# Patient Record
Sex: Female | Born: 1935 | Race: White | Hispanic: No
Health system: Southern US, Community
[De-identification: ages and names within clinical notes are randomized; demographics above are authoritative.]

## PROBLEM LIST (undated history)

## (undated) DIAGNOSIS — F419 Anxiety disorder, unspecified: Secondary | ICD-10-CM

## (undated) DIAGNOSIS — E785 Hyperlipidemia, unspecified: Secondary | ICD-10-CM

## (undated) DIAGNOSIS — K851 Biliary acute pancreatitis without necrosis or infection: Secondary | ICD-10-CM

## (undated) DIAGNOSIS — I10 Essential (primary) hypertension: Secondary | ICD-10-CM

## (undated) HISTORY — DX: Biliary acute pancreatitis without necrosis or infection: K85.10

## (undated) HISTORY — DX: Essential (primary) hypertension: I10

## (undated) HISTORY — PX: TONSILLECTOMY: SUR1361

## (undated) HISTORY — DX: Anxiety disorder, unspecified: F41.9

## (undated) HISTORY — DX: Hyperlipidemia, unspecified: E78.5

---

## 2000-10-02 ENCOUNTER — Other Ambulatory Visit: Admission: RE | Admit: 2000-10-02 | Discharge: 2000-10-02 | Payer: Self-pay | Admitting: *Deleted

## 2000-11-21 ENCOUNTER — Encounter: Admission: RE | Admit: 2000-11-21 | Discharge: 2000-11-21 | Payer: Self-pay | Admitting: Family Medicine

## 2000-11-21 ENCOUNTER — Encounter: Payer: Self-pay | Admitting: Family Medicine

## 2001-12-26 ENCOUNTER — Encounter: Admission: RE | Admit: 2001-12-26 | Discharge: 2001-12-26 | Payer: Self-pay | Admitting: Family Medicine

## 2001-12-26 ENCOUNTER — Encounter: Payer: Self-pay | Admitting: Family Medicine

## 2003-01-09 ENCOUNTER — Encounter: Admission: RE | Admit: 2003-01-09 | Discharge: 2003-01-09 | Payer: Self-pay | Admitting: Family Medicine

## 2004-02-19 ENCOUNTER — Encounter: Admission: RE | Admit: 2004-02-19 | Discharge: 2004-02-19 | Payer: Self-pay | Admitting: Family Medicine

## 2005-04-20 ENCOUNTER — Encounter: Admission: RE | Admit: 2005-04-20 | Discharge: 2005-04-20 | Payer: Self-pay | Admitting: Family Medicine

## 2006-06-07 ENCOUNTER — Encounter: Admission: RE | Admit: 2006-06-07 | Discharge: 2006-06-07 | Payer: Self-pay | Admitting: Family Medicine

## 2007-02-08 DIAGNOSIS — K851 Biliary acute pancreatitis without necrosis or infection: Secondary | ICD-10-CM

## 2007-02-08 HISTORY — DX: Biliary acute pancreatitis without necrosis or infection: K85.10

## 2007-07-12 ENCOUNTER — Encounter: Admission: RE | Admit: 2007-07-12 | Discharge: 2007-07-12 | Payer: Self-pay | Admitting: Family Medicine

## 2007-10-30 ENCOUNTER — Inpatient Hospital Stay (HOSPITAL_COMMUNITY): Admission: EM | Admit: 2007-10-30 | Discharge: 2007-11-10 | Payer: Self-pay | Admitting: Emergency Medicine

## 2007-10-31 ENCOUNTER — Ambulatory Visit: Payer: Self-pay | Admitting: Internal Medicine

## 2007-11-07 ENCOUNTER — Encounter (INDEPENDENT_AMBULATORY_CARE_PROVIDER_SITE_OTHER): Payer: Self-pay | Admitting: Internal Medicine

## 2007-12-09 HISTORY — PX: CHOLECYSTECTOMY: SHX55

## 2007-12-24 ENCOUNTER — Encounter (INDEPENDENT_AMBULATORY_CARE_PROVIDER_SITE_OTHER): Payer: Self-pay | Admitting: General Surgery

## 2007-12-24 ENCOUNTER — Ambulatory Visit (HOSPITAL_COMMUNITY): Admission: RE | Admit: 2007-12-24 | Discharge: 2007-12-25 | Payer: Self-pay | Admitting: General Surgery

## 2008-07-14 ENCOUNTER — Encounter: Admission: RE | Admit: 2008-07-14 | Discharge: 2008-07-14 | Payer: Self-pay | Admitting: Family Medicine

## 2008-07-28 ENCOUNTER — Ambulatory Visit: Payer: Self-pay | Admitting: Internal Medicine

## 2008-08-07 ENCOUNTER — Encounter: Payer: Self-pay | Admitting: Internal Medicine

## 2008-08-07 ENCOUNTER — Ambulatory Visit: Payer: Self-pay | Admitting: Internal Medicine

## 2008-08-11 ENCOUNTER — Encounter: Payer: Self-pay | Admitting: Internal Medicine

## 2009-07-08 DIAGNOSIS — C9210 Chronic myeloid leukemia, BCR/ABL-positive, not having achieved remission: Secondary | ICD-10-CM | POA: Insufficient documentation

## 2009-07-13 ENCOUNTER — Ambulatory Visit: Payer: Self-pay | Admitting: Oncology

## 2009-07-13 ENCOUNTER — Other Ambulatory Visit: Admission: RE | Admit: 2009-07-13 | Discharge: 2009-07-13 | Payer: Self-pay | Admitting: Oncology

## 2009-07-13 LAB — CBC WITH DIFFERENTIAL/PLATELET
HCT: 23.6 % — ABNORMAL LOW (ref 34.8–46.6)
HGB: 8.2 g/dL — ABNORMAL LOW (ref 11.6–15.9)
MCH: 34 pg (ref 25.1–34.0)
MCHC: 34.7 g/dL (ref 31.5–36.0)
MCV: 97.9 fL (ref 79.5–101.0)
Platelets: 330 10*3/uL (ref 145–400)
RBC: 2.41 10*6/uL — ABNORMAL LOW (ref 3.70–5.45)
RDW: 17.8 % — ABNORMAL HIGH (ref 11.2–14.5)
WBC: 418.9 10*3/uL (ref 3.9–10.3)
nRBC: 2 % — ABNORMAL HIGH (ref 0–0)

## 2009-07-13 LAB — COMPREHENSIVE METABOLIC PANEL
ALT: 17 U/L (ref 0–35)
AST: 42 U/L — ABNORMAL HIGH (ref 0–37)
Albumin: 3.3 g/dL — ABNORMAL LOW (ref 3.5–5.2)
Alkaline Phosphatase: 82 U/L (ref 39–117)
BUN: 13 mg/dL (ref 6–23)
CO2: 25 mEq/L (ref 19–32)
Calcium: 8.9 mg/dL (ref 8.4–10.5)
Chloride: 106 mEq/L (ref 96–112)
Creatinine, Ser: 1.04 mg/dL (ref 0.40–1.20)
Glucose, Bld: 98 mg/dL (ref 70–99)
Potassium: 3.1 mEq/L — ABNORMAL LOW (ref 3.5–5.3)
Sodium: 140 mEq/L (ref 135–145)
Total Bilirubin: 0.7 mg/dL (ref 0.3–1.2)
Total Protein: 6.6 g/dL (ref 6.0–8.3)

## 2009-07-13 LAB — LACTATE DEHYDROGENASE: LDH: 1631 U/L — ABNORMAL HIGH (ref 94–250)

## 2009-07-13 LAB — MANUAL DIFFERENTIAL
ALC: 4.2 10e3/uL — ABNORMAL HIGH (ref 0.9–3.3)
ANC (CHCC manual diff): 339.3 10e3/uL — ABNORMAL HIGH (ref 1.5–6.5)
Band Neutrophils: 5 % (ref 0–10)
Basophil: 7 % — ABNORMAL HIGH (ref 0–2)
Blasts: 4 % — ABNORMAL HIGH (ref 0–0)
EOS: 6 % (ref 0–7)
LYMPH: 1 % — ABNORMAL LOW (ref 14–49)
MONO: 0 % (ref 0–14)
Metamyelocytes: 8 % — ABNORMAL HIGH (ref 0–0)
Myelocytes: 33 % — ABNORMAL HIGH (ref 0–0)
Other Cell: 0 % (ref 0–0)
PLT EST: ADEQUATE
PROMYELO: 1 % — ABNORMAL HIGH (ref 0–0)
SEG: 35 % — ABNORMAL LOW (ref 38–77)
Variant Lymph: 0 % (ref 0–0)
nRBC: 2 % — ABNORMAL HIGH (ref 0–0)

## 2009-07-13 LAB — URIC ACID: Uric Acid, Serum: 6.2 mg/dL (ref 2.4–7.0)

## 2009-07-16 LAB — BCR/ABL (LIO MMD)

## 2009-07-17 LAB — BASIC METABOLIC PANEL
BUN: 10 mg/dL (ref 6–23)
CO2: 24 mEq/L (ref 19–32)
Calcium: 8.2 mg/dL — ABNORMAL LOW (ref 8.4–10.5)
Chloride: 105 mEq/L (ref 96–112)
Creatinine, Ser: 0.99 mg/dL (ref 0.40–1.20)
Glucose, Bld: 106 mg/dL — ABNORMAL HIGH (ref 70–99)
Potassium: 3.6 mEq/L (ref 3.5–5.3)
Sodium: 136 mEq/L (ref 135–145)

## 2009-07-17 LAB — CBC WITH DIFFERENTIAL/PLATELET
HCT: 22.4 % — ABNORMAL LOW (ref 34.8–46.6)
HGB: 7.8 g/dL — ABNORMAL LOW (ref 11.6–15.9)
MCH: 34.4 pg — ABNORMAL HIGH (ref 25.1–34.0)
MCHC: 34.8 g/dL (ref 31.5–36.0)
MCV: 98.7 fL (ref 79.5–101.0)
Platelets: 222 10*3/uL (ref 145–400)
RBC: 2.27 10*6/uL — ABNORMAL LOW (ref 3.70–5.45)
RDW: 17.8 % — ABNORMAL HIGH (ref 11.2–14.5)
WBC: 397 10*3/uL (ref 3.9–10.3)
nRBC: 2 % — ABNORMAL HIGH (ref 0–0)

## 2009-07-17 LAB — MANUAL DIFFERENTIAL
ALC: 4 10*3/uL — ABNORMAL HIGH (ref 0.9–3.3)
ANC (CHCC manual diff): 273.9 10*3/uL — ABNORMAL HIGH (ref 1.5–6.5)
Band Neutrophils: 14 % — ABNORMAL HIGH (ref 0–10)
Basophil: 14 % — ABNORMAL HIGH (ref 0–2)
Blasts: 4 % — ABNORMAL HIGH (ref 0–0)
EOS: 8 % — ABNORMAL HIGH (ref 0–7)
LYMPH: 1 % — ABNORMAL LOW (ref 14–49)
MONO: 1 % (ref 0–14)
Metamyelocytes: 16 % — ABNORMAL HIGH (ref 0–0)
Myelocytes: 17 % — ABNORMAL HIGH (ref 0–0)
Other Cell: 0 % (ref 0–0)
PLT EST: ADEQUATE
PROMYELO: 3 % — ABNORMAL HIGH (ref 0–0)
SEG: 22 % — ABNORMAL LOW (ref 38–77)
Variant Lymph: 0 % (ref 0–0)
nRBC: 3 % — ABNORMAL HIGH (ref 0–0)

## 2009-07-24 LAB — CBC WITH DIFFERENTIAL/PLATELET
HCT: 22.7 % — ABNORMAL LOW (ref 34.8–46.6)
HGB: 7.7 g/dL — ABNORMAL LOW (ref 11.6–15.9)
MCH: 33.8 pg (ref 25.1–34.0)
MCHC: 33.9 g/dL (ref 31.5–36.0)
MCV: 99.6 fL (ref 79.5–101.0)
Platelets: 410 10*3/uL — ABNORMAL HIGH (ref 145–400)
RBC: 2.28 10*6/uL — ABNORMAL LOW (ref 3.70–5.45)
RDW: 19.8 % — ABNORMAL HIGH (ref 11.2–14.5)
WBC: 323.8 10*3/uL (ref 3.9–10.3)
nRBC: 2 % — ABNORMAL HIGH (ref 0–0)

## 2009-07-24 LAB — MANUAL DIFFERENTIAL
ALC: 3.2 10*3/uL (ref 0.9–3.3)
ANC (CHCC manual diff): 229.9 10*3/uL — ABNORMAL HIGH (ref 1.5–6.5)
Band Neutrophils: 8 % (ref 0–10)
Basophil: 10 % — ABNORMAL HIGH (ref 0–2)
Blasts: 2 % — ABNORMAL HIGH (ref 0–0)
EOS: 8 % — ABNORMAL HIGH (ref 0–7)
LYMPH: 1 % — ABNORMAL LOW (ref 14–49)
MONO: 6 % (ref 0–14)
Metamyelocytes: 6 % — ABNORMAL HIGH (ref 0–0)
Myelocytes: 3 % — ABNORMAL HIGH (ref 0–0)
Other Cell: 0 % (ref 0–0)
PLT EST: INCREASED
PROMYELO: 2 % — ABNORMAL HIGH (ref 0–0)
SEG: 54 % (ref 38–77)
Variant Lymph: 0 % (ref 0–0)
nRBC: 3 % — ABNORMAL HIGH (ref 0–0)

## 2009-07-29 LAB — CYTOGENETICS, PERIPHERAL BLOOD

## 2009-07-29 LAB — FISH, PERIPHERAL BLOOD

## 2009-07-31 LAB — MANUAL DIFFERENTIAL
ALC: 11.3 10*3/uL — ABNORMAL HIGH (ref 0.9–3.3)
ANC (CHCC manual diff): 167.8 10*3/uL — ABNORMAL HIGH (ref 1.5–6.5)
Band Neutrophils: 21 % — ABNORMAL HIGH (ref 0–10)
Basophil: 6 % — ABNORMAL HIGH (ref 0–2)
Blasts: 2 % — ABNORMAL HIGH (ref 0–0)
EOS: 4 % (ref 0–7)
LYMPH: 5 % — ABNORMAL LOW (ref 14–49)
MONO: 7 % (ref 0–14)
Metamyelocytes: 7 % — ABNORMAL HIGH (ref 0–0)
Myelocytes: 11 % — ABNORMAL HIGH (ref 0–0)
Other Cell: 0 % (ref 0–0)
PLT EST: INCREASED
PROMYELO: 2 % — ABNORMAL HIGH (ref 0–0)
SEG: 35 % — ABNORMAL LOW (ref 38–77)
Variant Lymph: 0 % (ref 0–0)
nRBC: 2 % — ABNORMAL HIGH (ref 0–0)

## 2009-07-31 LAB — CBC WITH DIFFERENTIAL/PLATELET
HCT: 25.4 % — ABNORMAL LOW (ref 34.8–46.6)
HGB: 8.2 g/dL — ABNORMAL LOW (ref 11.6–15.9)
MCH: 33.5 pg (ref 25.1–34.0)
MCHC: 32.3 g/dL (ref 31.5–36.0)
MCV: 103.7 fL — ABNORMAL HIGH (ref 79.5–101.0)
Platelets: 525 10*3/uL — ABNORMAL HIGH (ref 145–400)
RBC: 2.45 10*6/uL — ABNORMAL LOW (ref 3.70–5.45)
RDW: 23.8 % — ABNORMAL HIGH (ref 11.2–14.5)
WBC: 226.8 10*3/uL (ref 3.9–10.3)
nRBC: 3 % — ABNORMAL HIGH (ref 0–0)

## 2009-08-12 ENCOUNTER — Ambulatory Visit: Payer: Self-pay | Admitting: Oncology

## 2009-08-12 LAB — CBC WITH DIFFERENTIAL/PLATELET
HCT: 25.2 % — ABNORMAL LOW (ref 34.8–46.6)
HGB: 8.5 g/dL — ABNORMAL LOW (ref 11.6–15.9)
MCH: 34.6 pg — ABNORMAL HIGH (ref 25.1–34.0)
MCHC: 33.7 g/dL (ref 31.5–36.0)
MCV: 102.7 fL — ABNORMAL HIGH (ref 79.5–101.0)
Platelets: 463 10*3/uL — ABNORMAL HIGH (ref 145–400)
RBC: 2.46 10*6/uL — ABNORMAL LOW (ref 3.70–5.45)
RDW: 26 % — ABNORMAL HIGH (ref 11.2–14.5)
WBC: 217.7 10*3/uL (ref 3.9–10.3)

## 2009-08-12 LAB — MANUAL DIFFERENTIAL
ALC: 4.4 10*3/uL — ABNORMAL HIGH (ref 0.9–3.3)
ANC (CHCC manual diff): 169.8 10*3/uL — ABNORMAL HIGH (ref 1.5–6.5)
Band Neutrophils: 11 % — ABNORMAL HIGH (ref 0–10)
Basophil: 9 % — ABNORMAL HIGH (ref 0–2)
Blasts: 2 % — ABNORMAL HIGH (ref 0–0)
EOS: 4 % (ref 0–7)
LYMPH: 2 % — ABNORMAL LOW (ref 14–49)
MONO: 1 % (ref 0–14)
Metamyelocytes: 15 % — ABNORMAL HIGH (ref 0–0)
Myelocytes: 10 % — ABNORMAL HIGH (ref 0–0)
Other Cell: 0 % (ref 0–0)
PLT EST: INCREASED
PROMYELO: 4 % — ABNORMAL HIGH (ref 0–0)
SEG: 42 % (ref 38–77)
Variant Lymph: 0 % (ref 0–0)
nRBC: 2 % — ABNORMAL HIGH (ref 0–0)

## 2009-08-18 LAB — MANUAL DIFFERENTIAL
ALC: 5.7 10*3/uL — ABNORMAL HIGH (ref 0.9–3.3)
ANC (CHCC manual diff): 134.5 10*3/uL — ABNORMAL HIGH (ref 1.5–6.5)
Band Neutrophils: 20 % — ABNORMAL HIGH (ref 0–10)
Basophil: 13 % — ABNORMAL HIGH (ref 0–2)
Blasts: 2 % — ABNORMAL HIGH (ref 0–0)
EOS: 5 % (ref 0–7)
LYMPH: 3 % — ABNORMAL LOW (ref 14–49)
MONO: 1 % (ref 0–14)
Metamyelocytes: 8 % — ABNORMAL HIGH (ref 0–0)
Myelocytes: 15 % — ABNORMAL HIGH (ref 0–0)
Other Cell: 0 % (ref 0–0)
PLT EST: ADEQUATE
PROMYELO: 5 % — ABNORMAL HIGH (ref 0–0)
SEG: 28 % — ABNORMAL LOW (ref 38–77)
Variant Lymph: 0 % (ref 0–0)
nRBC: 1 % — ABNORMAL HIGH (ref 0–0)

## 2009-08-18 LAB — CBC WITH DIFFERENTIAL/PLATELET
HCT: 24.6 % — ABNORMAL LOW (ref 34.8–46.6)
HGB: 8.6 g/dL — ABNORMAL LOW (ref 11.6–15.9)
MCH: 36.8 pg — ABNORMAL HIGH (ref 25.1–34.0)
MCHC: 34.9 g/dL (ref 31.5–36.0)
MCV: 105.3 fL — ABNORMAL HIGH (ref 79.5–101.0)
Platelets: 431 10*3/uL — ABNORMAL HIGH (ref 145–400)
RBC: 2.34 10*6/uL — ABNORMAL LOW (ref 3.70–5.45)
RDW: 27.1 % — ABNORMAL HIGH (ref 11.2–14.5)
WBC: 189.4 10*3/uL (ref 3.9–10.3)

## 2009-08-24 LAB — MANUAL DIFFERENTIAL
ALC: 2.1 10*3/uL (ref 0.9–3.3)
ANC (CHCC manual diff): 172.9 10*3/uL — ABNORMAL HIGH (ref 1.5–6.5)
Band Neutrophils: 18 % — ABNORMAL HIGH (ref 0–10)
Basophil: 8 % — ABNORMAL HIGH (ref 0–2)
Blasts: 2 % — ABNORMAL HIGH (ref 0–0)
EOS: 5 % (ref 0–7)
LYMPH: 1 % — ABNORMAL LOW (ref 14–49)
MONO: 1 % (ref 0–14)
Metamyelocytes: 11 % — ABNORMAL HIGH (ref 0–0)
Myelocytes: 22 % — ABNORMAL HIGH (ref 0–0)
PLT EST: ADEQUATE
PROMYELO: 2 % — ABNORMAL HIGH (ref 0–0)
Platelet Morphology: NORMAL
SEG: 30 % — ABNORMAL LOW (ref 38–77)
nRBC: 6 % — ABNORMAL HIGH (ref 0–0)

## 2009-08-24 LAB — CBC WITH DIFFERENTIAL/PLATELET
HCT: 24.4 % — ABNORMAL LOW (ref 34.8–46.6)
HGB: 8.1 g/dL — ABNORMAL LOW (ref 11.6–15.9)
MCH: 36.5 pg — ABNORMAL HIGH (ref 25.1–34.0)
MCHC: 33.2 g/dL (ref 31.5–36.0)
MCV: 109.9 fL — ABNORMAL HIGH (ref 79.5–101.0)
Platelets: 376 10*3/uL (ref 145–400)
RBC: 2.22 10*6/uL — ABNORMAL LOW (ref 3.70–5.45)
RDW: 26.3 % — ABNORMAL HIGH (ref 11.2–14.5)
WBC: 213.4 10*3/uL (ref 3.9–10.3)
nRBC: 2 % — ABNORMAL HIGH (ref 0–0)

## 2009-09-01 LAB — CBC WITH DIFFERENTIAL/PLATELET
BASO%: 0 % (ref 0.0–2.0)
Basophils Absolute: 0 10*3/uL (ref 0.0–0.1)
EOS%: 2.8 % (ref 0.0–7.0)
Eosinophils Absolute: 5.3 10*3/uL — ABNORMAL HIGH (ref 0.0–0.5)
HCT: 26.9 % — ABNORMAL LOW (ref 34.8–46.6)
HGB: 9.1 g/dL — ABNORMAL LOW (ref 11.6–15.9)
LYMPH%: 4.9 % — ABNORMAL LOW (ref 14.0–49.7)
MCH: 37.4 pg — ABNORMAL HIGH (ref 25.1–34.0)
MCHC: 34 g/dL (ref 31.5–36.0)
MCV: 109.9 fL — ABNORMAL HIGH (ref 79.5–101.0)
MONO#: 1.3 10*3/uL — ABNORMAL HIGH (ref 0.1–0.9)
MONO%: 0.7 % (ref 0.0–14.0)
NEUT#: 172.7 10*3/uL — ABNORMAL HIGH (ref 1.5–6.5)
NEUT%: 91.6 % — ABNORMAL HIGH (ref 38.4–76.8)
Platelets: 430 10*3/uL — ABNORMAL HIGH (ref 145–400)
RBC: 2.45 10*6/uL — ABNORMAL LOW (ref 3.70–5.45)
RDW: 27.5 % — ABNORMAL HIGH (ref 11.2–14.5)
WBC: 188.5 10*3/uL (ref 3.9–10.3)
lymph#: 9.2 10*3/uL — ABNORMAL HIGH (ref 0.9–3.3)

## 2009-09-01 LAB — TECHNOLOGIST REVIEW: Technologist Review: 13

## 2009-09-11 ENCOUNTER — Ambulatory Visit: Payer: Self-pay | Admitting: Oncology

## 2009-09-15 LAB — CBC WITH DIFFERENTIAL/PLATELET
BASO%: 0 % (ref 0.0–2.0)
Basophils Absolute: 0 10*3/uL (ref 0.0–0.1)
EOS%: 0.7 % (ref 0.0–7.0)
Eosinophils Absolute: 0.1 10*3/uL (ref 0.0–0.5)
HCT: 24.9 % — ABNORMAL LOW (ref 34.8–46.6)
HGB: 8.6 g/dL — ABNORMAL LOW (ref 11.6–15.9)
LYMPH%: 8 % — ABNORMAL LOW (ref 14.0–49.7)
MCH: 37.1 pg — ABNORMAL HIGH (ref 25.1–34.0)
MCHC: 34.6 g/dL (ref 31.5–36.0)
MCV: 107.3 fL — ABNORMAL HIGH (ref 79.5–101.0)
MONO#: 0.9 10*3/uL (ref 0.1–0.9)
MONO%: 9 % (ref 0.0–14.0)
NEUT#: 8 10*3/uL — ABNORMAL HIGH (ref 1.5–6.5)
NEUT%: 82.3 % — ABNORMAL HIGH (ref 38.4–76.8)
Platelets: 190 10*3/uL (ref 145–400)
RBC: 2.32 10*6/uL — ABNORMAL LOW (ref 3.70–5.45)
RDW: 19.8 % — ABNORMAL HIGH (ref 11.2–14.5)
WBC: 9.8 10*3/uL (ref 3.9–10.3)
lymph#: 0.8 10*3/uL — ABNORMAL LOW (ref 0.9–3.3)

## 2009-09-21 LAB — CBC WITH DIFFERENTIAL/PLATELET
BASO%: 0.1 % (ref 0.0–2.0)
Basophils Absolute: 0 10*3/uL (ref 0.0–0.1)
EOS%: 0.8 % (ref 0.0–7.0)
Eosinophils Absolute: 0.1 10*3/uL (ref 0.0–0.5)
HCT: 24.9 % — ABNORMAL LOW (ref 34.8–46.6)
HGB: 8.4 g/dL — ABNORMAL LOW (ref 11.6–15.9)
LYMPH%: 8.2 % — ABNORMAL LOW (ref 14.0–49.7)
MCH: 36.1 pg — ABNORMAL HIGH (ref 25.1–34.0)
MCHC: 34 g/dL (ref 31.5–36.0)
MCV: 106.1 fL — ABNORMAL HIGH (ref 79.5–101.0)
MONO#: 0.3 10*3/uL (ref 0.1–0.9)
MONO%: 3.9 % (ref 0.0–14.0)
NEUT#: 5.7 10*3/uL (ref 1.5–6.5)
NEUT%: 87 % — ABNORMAL HIGH (ref 38.4–76.8)
Platelets: 156 10*3/uL (ref 145–400)
RBC: 2.34 10*6/uL — ABNORMAL LOW (ref 3.70–5.45)
RDW: 17.7 % — ABNORMAL HIGH (ref 11.2–14.5)
WBC: 6.5 10*3/uL (ref 3.9–10.3)
lymph#: 0.5 10*3/uL — ABNORMAL LOW (ref 0.9–3.3)

## 2009-09-21 LAB — COMPREHENSIVE METABOLIC PANEL
ALT: 9 U/L (ref 0–35)
AST: 12 U/L (ref 0–37)
Albumin: 3.5 g/dL (ref 3.5–5.2)
Alkaline Phosphatase: 61 U/L (ref 39–117)
BUN: 10 mg/dL (ref 6–23)
CO2: 25 mEq/L (ref 19–32)
Calcium: 8.2 mg/dL — ABNORMAL LOW (ref 8.4–10.5)
Chloride: 102 mEq/L (ref 96–112)
Creatinine, Ser: 0.9 mg/dL (ref 0.40–1.20)
Glucose, Bld: 133 mg/dL — ABNORMAL HIGH (ref 70–99)
Potassium: 3 mEq/L — ABNORMAL LOW (ref 3.5–5.3)
Sodium: 138 mEq/L (ref 135–145)
Total Bilirubin: 1.1 mg/dL (ref 0.3–1.2)
Total Protein: 5.7 g/dL — ABNORMAL LOW (ref 6.0–8.3)

## 2009-10-06 LAB — CBC WITH DIFFERENTIAL/PLATELET
BASO%: 1.5 % (ref 0.0–2.0)
Basophils Absolute: 0.1 10*3/uL (ref 0.0–0.1)
EOS%: 0.3 % (ref 0.0–7.0)
Eosinophils Absolute: 0 10*3/uL (ref 0.0–0.5)
HCT: 20.4 % — ABNORMAL LOW (ref 34.8–46.6)
HGB: 6.9 g/dL — CL (ref 11.6–15.9)
LYMPH%: 9.7 % — ABNORMAL LOW (ref 14.0–49.7)
MCH: 35.9 pg — ABNORMAL HIGH (ref 25.1–34.0)
MCHC: 33.8 g/dL (ref 31.5–36.0)
MCV: 106.3 fL — ABNORMAL HIGH (ref 79.5–101.0)
MONO#: 0.4 10*3/uL (ref 0.1–0.9)
MONO%: 6 % (ref 0.0–14.0)
NEUT#: 5 10*3/uL (ref 1.5–6.5)
NEUT%: 82.5 % — ABNORMAL HIGH (ref 38.4–76.8)
Platelets: 92 10*3/uL — ABNORMAL LOW (ref 145–400)
RBC: 1.92 10*6/uL — ABNORMAL LOW (ref 3.70–5.45)
RDW: 18.8 % — ABNORMAL HIGH (ref 11.2–14.5)
WBC: 6 10*3/uL (ref 3.9–10.3)
lymph#: 0.6 10*3/uL — ABNORMAL LOW (ref 0.9–3.3)

## 2009-10-13 ENCOUNTER — Ambulatory Visit: Payer: Self-pay | Admitting: Oncology

## 2009-10-13 LAB — CBC WITH DIFFERENTIAL/PLATELET
BASO%: 1.1 % (ref 0.0–2.0)
Basophils Absolute: 0.1 10*3/uL (ref 0.0–0.1)
EOS%: 1.7 % (ref 0.0–7.0)
Eosinophils Absolute: 0.1 10*3/uL (ref 0.0–0.5)
HCT: 23 % — ABNORMAL LOW (ref 34.8–46.6)
HGB: 7.8 g/dL — ABNORMAL LOW (ref 11.6–15.9)
LYMPH%: 13.8 % — ABNORMAL LOW (ref 14.0–49.7)
MCH: 36.8 pg — ABNORMAL HIGH (ref 25.1–34.0)
MCHC: 33.8 g/dL (ref 31.5–36.0)
MCV: 108.8 fL — ABNORMAL HIGH (ref 79.5–101.0)
MONO#: 0.4 10*3/uL (ref 0.1–0.9)
MONO%: 8 % (ref 0.0–14.0)
NEUT#: 4.1 10*3/uL (ref 1.5–6.5)
NEUT%: 75.4 % (ref 38.4–76.8)
Platelets: 168 10*3/uL (ref 145–400)
RBC: 2.11 10*6/uL — ABNORMAL LOW (ref 3.70–5.45)
RDW: 19.4 % — ABNORMAL HIGH (ref 11.2–14.5)
WBC: 5.5 10*3/uL (ref 3.9–10.3)
lymph#: 0.8 10*3/uL — ABNORMAL LOW (ref 0.9–3.3)

## 2009-10-13 LAB — COMPREHENSIVE METABOLIC PANEL
ALT: 12 U/L (ref 0–35)
AST: 14 U/L (ref 0–37)
Albumin: 3.6 g/dL (ref 3.5–5.2)
Alkaline Phosphatase: 59 U/L (ref 39–117)
BUN: 10 mg/dL (ref 6–23)
CO2: 28 mEq/L (ref 19–32)
Calcium: 8.7 mg/dL (ref 8.4–10.5)
Chloride: 105 mEq/L (ref 96–112)
Creatinine, Ser: 0.8 mg/dL (ref 0.40–1.20)
Glucose, Bld: 100 mg/dL — ABNORMAL HIGH (ref 70–99)
Potassium: 3.6 mEq/L (ref 3.5–5.3)
Sodium: 138 mEq/L (ref 135–145)
Total Bilirubin: 0.7 mg/dL (ref 0.3–1.2)
Total Protein: 5.7 g/dL — ABNORMAL LOW (ref 6.0–8.3)

## 2009-10-13 LAB — PHOSPHORUS: Phosphorus: 2.6 mg/dL (ref 2.3–4.6)

## 2009-10-13 LAB — MAGNESIUM: Magnesium: 1.8 mg/dL (ref 1.5–2.5)

## 2009-11-12 ENCOUNTER — Ambulatory Visit: Payer: Self-pay | Admitting: Oncology

## 2009-11-16 LAB — BASIC METABOLIC PANEL
BUN: 14 mg/dL (ref 6–23)
CO2: 30 mEq/L (ref 19–32)
Calcium: 8.8 mg/dL (ref 8.4–10.5)
Chloride: 103 mEq/L (ref 96–112)
Creatinine, Ser: 0.79 mg/dL (ref 0.40–1.20)
Glucose, Bld: 97 mg/dL (ref 70–99)
Potassium: 3.4 mEq/L — ABNORMAL LOW (ref 3.5–5.3)
Sodium: 139 mEq/L (ref 135–145)

## 2009-11-16 LAB — CBC WITH DIFFERENTIAL/PLATELET
BASO%: 1 % (ref 0.0–2.0)
Basophils Absolute: 0 10*3/uL (ref 0.0–0.1)
EOS%: 2.9 % (ref 0.0–7.0)
Eosinophils Absolute: 0.1 10*3/uL (ref 0.0–0.5)
HCT: 31.6 % — ABNORMAL LOW (ref 34.8–46.6)
HGB: 10.8 g/dL — ABNORMAL LOW (ref 11.6–15.9)
LYMPH%: 25.3 % (ref 14.0–49.7)
MCH: 32 pg (ref 25.1–34.0)
MCHC: 34 g/dL (ref 31.5–36.0)
MCV: 94.3 fL (ref 79.5–101.0)
MONO#: 0.3 10*3/uL (ref 0.1–0.9)
MONO%: 6 % (ref 0.0–14.0)
NEUT#: 2.7 10*3/uL (ref 1.5–6.5)
NEUT%: 64.8 % (ref 38.4–76.8)
Platelets: 164 10*3/uL (ref 145–400)
RBC: 3.35 10*6/uL — ABNORMAL LOW (ref 3.70–5.45)
RDW: 16.3 % — ABNORMAL HIGH (ref 11.2–14.5)
WBC: 4.2 10*3/uL (ref 3.9–10.3)
lymph#: 1.1 10*3/uL (ref 0.9–3.3)

## 2009-11-26 LAB — CBC WITH DIFFERENTIAL/PLATELET
BASO%: 1.7 % (ref 0.0–2.0)
Basophils Absolute: 0.1 10*3/uL (ref 0.0–0.1)
EOS%: 1.7 % (ref 0.0–7.0)
Eosinophils Absolute: 0.1 10*3/uL (ref 0.0–0.5)
HCT: 32 % — ABNORMAL LOW (ref 34.8–46.6)
HGB: 11.1 g/dL — ABNORMAL LOW (ref 11.6–15.9)
LYMPH%: 35.6 % (ref 14.0–49.7)
MCH: 32.2 pg (ref 25.1–34.0)
MCHC: 34.8 g/dL (ref 31.5–36.0)
MCV: 92.6 fL (ref 79.5–101.0)
MONO#: 0.3 10*3/uL (ref 0.1–0.9)
MONO%: 10.9 % (ref 0.0–14.0)
NEUT#: 1.5 10*3/uL (ref 1.5–6.5)
NEUT%: 50.1 % (ref 38.4–76.8)
Platelets: 129 10*3/uL — ABNORMAL LOW (ref 145–400)
RBC: 3.46 10*6/uL — ABNORMAL LOW (ref 3.70–5.45)
RDW: 16.9 % — ABNORMAL HIGH (ref 11.2–14.5)
WBC: 3 10*3/uL — ABNORMAL LOW (ref 3.9–10.3)
lymph#: 1.1 10*3/uL (ref 0.9–3.3)

## 2009-11-26 LAB — BASIC METABOLIC PANEL
BUN: 17 mg/dL (ref 6–23)
CO2: 28 mEq/L (ref 19–32)
Calcium: 8.8 mg/dL (ref 8.4–10.5)
Chloride: 106 mEq/L (ref 96–112)
Creatinine, Ser: 0.9 mg/dL (ref 0.40–1.20)
Glucose, Bld: 103 mg/dL — ABNORMAL HIGH (ref 70–99)
Potassium: 3.5 mEq/L (ref 3.5–5.3)
Sodium: 140 mEq/L (ref 135–145)

## 2009-12-08 LAB — CBC WITH DIFFERENTIAL/PLATELET
BASO%: 1.2 % (ref 0.0–2.0)
Basophils Absolute: 0 10*3/uL (ref 0.0–0.1)
EOS%: 2.6 % (ref 0.0–7.0)
Eosinophils Absolute: 0.1 10*3/uL (ref 0.0–0.5)
HCT: 32.2 % — ABNORMAL LOW (ref 34.8–46.6)
HGB: 11.1 g/dL — ABNORMAL LOW (ref 11.6–15.9)
LYMPH%: 33.8 % (ref 14.0–49.7)
MCH: 31.9 pg (ref 25.1–34.0)
MCHC: 34.4 g/dL (ref 31.5–36.0)
MCV: 92.7 fL (ref 79.5–101.0)
MONO#: 0.2 10*3/uL (ref 0.1–0.9)
MONO%: 7.3 % (ref 0.0–14.0)
NEUT#: 1.8 10*3/uL (ref 1.5–6.5)
NEUT%: 55.1 % (ref 38.4–76.8)
Platelets: 145 10*3/uL (ref 145–400)
RBC: 3.48 10*6/uL — ABNORMAL LOW (ref 3.70–5.45)
RDW: 18.2 % — ABNORMAL HIGH (ref 11.2–14.5)
WBC: 3.2 10*3/uL — ABNORMAL LOW (ref 3.9–10.3)
lymph#: 1.1 10*3/uL (ref 0.9–3.3)

## 2009-12-18 ENCOUNTER — Ambulatory Visit: Payer: Self-pay | Admitting: Oncology

## 2009-12-22 LAB — CBC WITH DIFFERENTIAL/PLATELET
BASO%: 0.9 % (ref 0.0–2.0)
Basophils Absolute: 0 10*3/uL (ref 0.0–0.1)
EOS%: 2.5 % (ref 0.0–7.0)
Eosinophils Absolute: 0.1 10*3/uL (ref 0.0–0.5)
HCT: 32.7 % — ABNORMAL LOW (ref 34.8–46.6)
HGB: 11.4 g/dL — ABNORMAL LOW (ref 11.6–15.9)
LYMPH%: 26.8 % (ref 14.0–49.7)
MCH: 32 pg (ref 25.1–34.0)
MCHC: 34.8 g/dL (ref 31.5–36.0)
MCV: 91.8 fL (ref 79.5–101.0)
MONO#: 0.3 10*3/uL (ref 0.1–0.9)
MONO%: 7.7 % (ref 0.0–14.0)
NEUT#: 2.3 10*3/uL (ref 1.5–6.5)
NEUT%: 62.1 % (ref 38.4–76.8)
Platelets: 139 10*3/uL — ABNORMAL LOW (ref 145–400)
RBC: 3.56 10*6/uL — ABNORMAL LOW (ref 3.70–5.45)
RDW: 17.9 % — ABNORMAL HIGH (ref 11.2–14.5)
WBC: 3.7 10*3/uL — ABNORMAL LOW (ref 3.9–10.3)
lymph#: 1 10*3/uL (ref 0.9–3.3)

## 2009-12-22 LAB — COMPREHENSIVE METABOLIC PANEL
ALT: 11 U/L (ref 0–35)
AST: 16 U/L (ref 0–37)
Albumin: 4 g/dL (ref 3.5–5.2)
Alkaline Phosphatase: 47 U/L (ref 39–117)
BUN: 13 mg/dL (ref 6–23)
CO2: 24 mEq/L (ref 19–32)
Calcium: 8.6 mg/dL (ref 8.4–10.5)
Chloride: 107 mEq/L (ref 96–112)
Creatinine, Ser: 0.87 mg/dL (ref 0.40–1.20)
Glucose, Bld: 87 mg/dL (ref 70–99)
Potassium: 3.7 mEq/L (ref 3.5–5.3)
Sodium: 142 mEq/L (ref 135–145)
Total Bilirubin: 0.4 mg/dL (ref 0.3–1.2)
Total Protein: 5.8 g/dL — ABNORMAL LOW (ref 6.0–8.3)

## 2010-01-19 ENCOUNTER — Ambulatory Visit: Payer: Self-pay | Admitting: Oncology

## 2010-01-21 LAB — CBC WITH DIFFERENTIAL/PLATELET
BASO%: 0.7 % (ref 0.0–2.0)
Basophils Absolute: 0 10*3/uL (ref 0.0–0.1)
EOS%: 4.3 % (ref 0.0–7.0)
Eosinophils Absolute: 0.1 10*3/uL (ref 0.0–0.5)
HCT: 32.2 % — ABNORMAL LOW (ref 34.8–46.6)
HGB: 11.4 g/dL — ABNORMAL LOW (ref 11.6–15.9)
LYMPH%: 39.3 % (ref 14.0–49.7)
MCH: 33.3 pg (ref 25.1–34.0)
MCHC: 35.4 g/dL (ref 31.5–36.0)
MCV: 94.3 fL (ref 79.5–101.0)
MONO#: 0.3 10*3/uL (ref 0.1–0.9)
MONO%: 10 % (ref 0.0–14.0)
NEUT#: 1.4 10*3/uL — ABNORMAL LOW (ref 1.5–6.5)
NEUT%: 45.7 % (ref 38.4–76.8)
Platelets: 154 10*3/uL (ref 145–400)
RBC: 3.42 10*6/uL — ABNORMAL LOW (ref 3.70–5.45)
RDW: 16.4 % — ABNORMAL HIGH (ref 11.2–14.5)
WBC: 3 10*3/uL — ABNORMAL LOW (ref 3.9–10.3)
lymph#: 1.2 10*3/uL (ref 0.9–3.3)

## 2010-01-27 LAB — BCR/ABL (LIO MMD)

## 2010-02-18 ENCOUNTER — Ambulatory Visit: Payer: Self-pay | Admitting: Oncology

## 2010-02-22 LAB — CBC WITH DIFFERENTIAL/PLATELET
BASO%: 0.6 % (ref 0.0–2.0)
Basophils Absolute: 0 10*3/uL (ref 0.0–0.1)
EOS%: 3.5 % (ref 0.0–7.0)
Eosinophils Absolute: 0.2 10*3/uL (ref 0.0–0.5)
HCT: 36.4 % (ref 34.8–46.6)
HGB: 12.8 g/dL (ref 11.6–15.9)
LYMPH%: 31.3 % (ref 14.0–49.7)
MCH: 34 pg (ref 25.1–34.0)
MCHC: 35 g/dL (ref 31.5–36.0)
MCV: 97.2 fL (ref 79.5–101.0)
MONO#: 0.5 10*3/uL (ref 0.1–0.9)
MONO%: 9.2 % (ref 0.0–14.0)
NEUT#: 2.8 10*3/uL (ref 1.5–6.5)
NEUT%: 55.4 % (ref 38.4–76.8)
Platelets: 191 10*3/uL (ref 145–400)
RBC: 3.75 10*6/uL (ref 3.70–5.45)
RDW: 13 % (ref 11.2–14.5)
WBC: 5 10*3/uL (ref 3.9–10.3)
lymph#: 1.6 10*3/uL (ref 0.9–3.3)

## 2010-02-22 LAB — COMPREHENSIVE METABOLIC PANEL
ALT: 16 U/L (ref 0–35)
AST: 20 U/L (ref 0–37)
Albumin: 4.6 g/dL (ref 3.5–5.2)
Alkaline Phosphatase: 43 U/L (ref 39–117)
BUN: 18 mg/dL (ref 6–23)
CO2: 26 mEq/L (ref 19–32)
Calcium: 9.5 mg/dL (ref 8.4–10.5)
Chloride: 102 mEq/L (ref 96–112)
Creatinine, Ser: 0.93 mg/dL (ref 0.40–1.20)
Glucose, Bld: 104 mg/dL — ABNORMAL HIGH (ref 70–99)
Potassium: 3.7 mEq/L (ref 3.5–5.3)
Sodium: 137 mEq/L (ref 135–145)
Total Bilirubin: 0.6 mg/dL (ref 0.3–1.2)
Total Protein: 6.9 g/dL (ref 6.0–8.3)

## 2010-03-11 NOTE — Procedures (Signed)
Summary: Colonoscopy   Colonoscopy  Procedure date:  08/07/2008  Findings:      Location:  [facility].    Procedures Next Due Date:    Colonoscopy: 08/2011  COLONOSCOPY PROCEDURE REPORT  PATIENT:  [name], [name]  MR#:  [id number] BIRTHDATE:   May 28, 1932, 77 yrs. old   GENDER:   female  ENDOSCOPIST:   Wilhemina Bonito. Eda Keys, MD Referred by: Surveillance Program Recall,  PROCEDURE DATE:  08/07/2008 PROCEDURE:  Colonoscopy with snare polypectomy ASA CLASS:   Class II INDICATIONS: history of pre-cancerous (adenomatous) colon polyps (INDEX EXAM ? DATE; NO POLYPS 4098,1191)  MEDICATIONS:    Fentanyl 50 mcg IV, Versed 6 mg IV  DESCRIPTION OF PROCEDURE:   After the risks benefits and alternatives of the procedure were thoroughly explained, informed consent was obtained.  Digital rectal exam was performed and revealed moderate external hemorrhoids.   The LB [id number] endoscope was introduced through the anus and advanced to the cecum, which was identified by both the appendix and ileocecal valve, without limitations.  TIME TO CECUM = 6:38 MIN. The quality of the prep was excellent, using MoviPrep.  The instrument was then withdrawn (TIME = 13:42 MIN)  as the colon was fully examined. <<PROCEDUREIMAGES>>                  <<OLD IMAGES>>  FINDINGS:  A 15mm sessile polyp was found in the ascending colon. Polyp was snared without cautery in piecemeal fashion. Retrieval was successful.    Multiple A.V. malformations were found in the cecum (see photo).  Severe diverticulosis was found in the left colon with associated stenosis.   Retroflexed views in the rectum revealed internal hemorrhoids.    The scope was then withdrawn from the patient and the procedure completed.  COMPLICATIONS:   None  ENDOSCOPIC IMPRESSION:  1) 15mm Sessile polyp in the ascending colon - removed  2) Av malformations in the cecum  3) Severe diverticulosis in the left colon w/ stenosis  4) Internal  and external hemorrhoids RECOMMENDATIONS:  1) Follow up colonoscopy in 3 years if polyp adenomatous and patient medically fit   _______________________________ Wilhemina Bonito. Eda Keys, MD  CC: Jill Guiles, MD;  The Patient   REPORT OF SURGICAL PATHOLOGY   Case #: [id number] Patient Name: [name], [name]. Office Chart Number:  [id number]   MRN: [id number] Pathologist: [name] Server A. Delila Spence, MD DOB/Age  Feb 04, 1933 (Age: 77)    Gender: F Date Taken:  08/07/2008 Date Received: 08/07/2008   FINAL DIAGNOSIS   ***MICROSCOPIC EXAMINATION AND DIAGNOSIS***   COLON, ASCENDING, POLYP, BIOPSY:   -  FRAGMENTS OF TUBULAR ADENOMA (15 MM SESSILE POLYP). -  NO HIGH GRADE DYSPLASIA OR INVASIVE MALIGNANCY IDENTIFIED.       kv Date Reported:  08/08/2008     [name]. Delila Spence, MD *** Electronically Signed Out By EAA ***    August 11, 2008 MRN: [id number]    [facility] [address] [place], [place]  [address]    Dear [name],  I am pleased to inform you that the colon polyp(s) removed during your recent colonoscopy was (were) found to be benign (no cancer detected) upon pathologic examination.  I recommend you have a repeat colonoscopy examination in 3 years to look for recurrent polyps, as having colon polyps increases your risk for having recurrent polyps or even colon cancer in the future.  Should you develop new or worsening symptoms of abdominal pain, bowel habit changes or bleeding  from the rectum or bowels, please schedule an evaluation with either your primary care physician or with me.  Additional information/recommendations:  __ No further action with gastroenterology is needed at this time. Please      follow-up with your primary care physician for your other healthcare      needs.    Please call us if you are having persistent problems or have questions about your condition that have not been fully answered at this time.  Sincerely,  Hilarie Fredrickson MD  This letter has  been electronically signed by your physician.   Signed by Hilarie Fredrickson MD on 08/11/2008 at 5:38 PM  ________________________________________________________________________ This report was created from the original endoscopy report, which was reviewed and signed by the above listed endoscopist.

## 2010-03-11 NOTE — Miscellaneous (Signed)
Summary: [facility] Previsit/prep  Clinical Lists Changes  Medications: Added new medication of MOVIPREP 100 GM  SOLR (PEG-KCL-NACL-NASULF-NA ASC-C) As per prep instructions. - Signed Rx of MOVIPREP 100 GM  SOLR (PEG-KCL-NACL-NASULF-NA ASC-C) As per prep instructions.;  #1 x 0;  Signed;  Entered by: Wyona Almas RN;  Authorized by: Hilarie Fredrickson MD;  Method used: Electronically to [facility] [address], [place], [place], [place]  [address], Ph: [phone], Fax: [phone] Observations: Added new observation of ALLERGY REV: Done (07/28/2008 8:21) Added new observation of NKA: T (07/28/2008 8:21)    Prescriptions: MOVIPREP 100 GM  SOLR (PEG-KCL-NACL-NASULF-NA ASC-C) As per prep instructions.  #1 x 0   Entered by:   Wyona Almas RN   Authorized by:   Hilarie Fredrickson MD   Signed by:   Wyona Almas RN on 07/28/2008   Method used:   Electronically to        [facility] (retail)       [address]       [place], [place]  [address]       Ph: [phone]       Fax: [phone]   RxID:   [id number]

## 2010-03-11 NOTE — Letter (Signed)
Summary: Patient Notice- Polyp Results  [facility]  [address] [place], [place] [address]   Phone: [phone]  Fax: [phone]        August 11, 2008 MRN: [id number]    [facility] [address] [place], [place]  [address]    Dear Ms. Raffel,  I am pleased to inform you that the colon polyp(s) removed during your recent colonoscopy was (were) found to be benign (no cancer detected) upon pathologic examination.  I recommend you have a repeat colonoscopy examination in 3 years to look for recurrent polyps, as having colon polyps increases your risk for having recurrent polyps or even colon cancer in the future.  Should you develop new or worsening symptoms of abdominal pain, bowel habit changes or bleeding from the rectum or bowels, please schedule an evaluation with either your primary care physician or with me.  Additional information/recommendations:  __ No further action with gastroenterology is needed at this time. Please      follow-up with your primary care physician for your other healthcare      needs.    Please call us if you are having persistent problems or have questions about your condition that have not been fully answered at this time.  Sincerely,  Hilarie Fredrickson MD  This letter has been electronically signed by your physician.

## 2010-03-25 ENCOUNTER — Other Ambulatory Visit: Payer: Self-pay | Admitting: Oncology

## 2010-03-25 ENCOUNTER — Encounter (HOSPITAL_BASED_OUTPATIENT_CLINIC_OR_DEPARTMENT_OTHER): Payer: Medicare Other | Admitting: Oncology

## 2010-03-25 DIAGNOSIS — C9210 Chronic myeloid leukemia, BCR/ABL-positive, not having achieved remission: Secondary | ICD-10-CM

## 2010-03-25 DIAGNOSIS — R197 Diarrhea, unspecified: Secondary | ICD-10-CM

## 2010-03-25 DIAGNOSIS — Z23 Encounter for immunization: Secondary | ICD-10-CM

## 2010-03-25 LAB — CBC WITH DIFFERENTIAL/PLATELET
BASO%: 0.3 % (ref 0.0–2.0)
Basophils Absolute: 0 10*3/uL (ref 0.0–0.1)
EOS%: 5.1 % (ref 0.0–7.0)
Eosinophils Absolute: 0.3 10*3/uL (ref 0.0–0.5)
HCT: 35.6 % (ref 34.8–46.6)
HGB: 12.4 g/dL (ref 11.6–15.9)
LYMPH%: 30.2 % (ref 14.0–49.7)
MCH: 33.8 pg (ref 25.1–34.0)
MCHC: 35 g/dL (ref 31.5–36.0)
MCV: 96.7 fL (ref 79.5–101.0)
MONO#: 0.5 10*3/uL (ref 0.1–0.9)
MONO%: 9.3 % (ref 0.0–14.0)
NEUT#: 2.7 10*3/uL (ref 1.5–6.5)
NEUT%: 55.1 % (ref 38.4–76.8)
Platelets: 179 10*3/uL (ref 145–400)
RBC: 3.68 10*6/uL — ABNORMAL LOW (ref 3.70–5.45)
RDW: 13 % (ref 11.2–14.5)
WBC: 5 10*3/uL (ref 3.9–10.3)
lymph#: 1.5 10*3/uL (ref 0.9–3.3)

## 2010-03-25 LAB — COMPREHENSIVE METABOLIC PANEL
ALT: 16 U/L (ref 0–35)
AST: 18 U/L (ref 0–37)
Albumin: 4.1 g/dL (ref 3.5–5.2)
Alkaline Phosphatase: 41 U/L (ref 39–117)
BUN: 18 mg/dL (ref 6–23)
CO2: 27 mEq/L (ref 19–32)
Calcium: 9 mg/dL (ref 8.4–10.5)
Chloride: 103 mEq/L (ref 96–112)
Creatinine, Ser: 0.87 mg/dL (ref 0.40–1.20)
Glucose, Bld: 90 mg/dL (ref 70–99)
Potassium: 3.7 mEq/L (ref 3.5–5.3)
Sodium: 139 mEq/L (ref 135–145)
Total Bilirubin: 0.5 mg/dL (ref 0.3–1.2)
Total Protein: 6.1 g/dL (ref 6.0–8.3)

## 2010-04-26 ENCOUNTER — Other Ambulatory Visit: Payer: Self-pay | Admitting: Oncology

## 2010-04-26 ENCOUNTER — Encounter (HOSPITAL_BASED_OUTPATIENT_CLINIC_OR_DEPARTMENT_OTHER): Payer: Medicare Other | Admitting: Oncology

## 2010-04-26 DIAGNOSIS — C9210 Chronic myeloid leukemia, BCR/ABL-positive, not having achieved remission: Secondary | ICD-10-CM

## 2010-04-26 LAB — COMPREHENSIVE METABOLIC PANEL
ALT: 16 U/L (ref 0–35)
AST: 20 U/L (ref 0–37)
Albumin: 4.2 g/dL (ref 3.5–5.2)
Alkaline Phosphatase: 40 U/L (ref 39–117)
BUN: 20 mg/dL (ref 6–23)
CO2: 26 mEq/L (ref 19–32)
Calcium: 9 mg/dL (ref 8.4–10.5)
Chloride: 106 mEq/L (ref 96–112)
Creatinine, Ser: 0.94 mg/dL (ref 0.40–1.20)
Glucose, Bld: 93 mg/dL (ref 70–99)
Potassium: 3.5 mEq/L (ref 3.5–5.3)
Sodium: 141 mEq/L (ref 135–145)
Total Bilirubin: 0.5 mg/dL (ref 0.3–1.2)
Total Protein: 6 g/dL (ref 6.0–8.3)

## 2010-04-26 LAB — CBC WITH DIFFERENTIAL/PLATELET
BASO%: 0.8 % (ref 0.0–2.0)
Basophils Absolute: 0 10*3/uL (ref 0.0–0.1)
EOS%: 9 % — ABNORMAL HIGH (ref 0.0–7.0)
Eosinophils Absolute: 0.5 10*3/uL (ref 0.0–0.5)
HCT: 34.5 % — ABNORMAL LOW (ref 34.8–46.6)
HGB: 12.1 g/dL (ref 11.6–15.9)
LYMPH%: 27.7 % (ref 14.0–49.7)
MCH: 33.7 pg (ref 25.1–34.0)
MCHC: 35 g/dL (ref 31.5–36.0)
MCV: 96.1 fL (ref 79.5–101.0)
MONO#: 0.4 10*3/uL (ref 0.1–0.9)
MONO%: 8.1 % (ref 0.0–14.0)
NEUT#: 2.9 10*3/uL (ref 1.5–6.5)
NEUT%: 54.4 % (ref 38.4–76.8)
Platelets: 195 10*3/uL (ref 145–400)
RBC: 3.59 10*6/uL — ABNORMAL LOW (ref 3.70–5.45)
RDW: 13.2 % (ref 11.2–14.5)
WBC: 5.3 10*3/uL (ref 3.9–10.3)
lymph#: 1.5 10*3/uL (ref 0.9–3.3)

## 2010-04-26 LAB — TISSUE HYBRIDIZATION (BONE MARROW)-NCBH

## 2010-04-26 LAB — CHROMOSOME ANALYSIS, BONE MARROW

## 2010-04-29 LAB — BCR/ABL (LIO MMD)

## 2010-06-03 ENCOUNTER — Other Ambulatory Visit: Payer: Self-pay | Admitting: Family Medicine

## 2010-06-03 DIAGNOSIS — Z1231 Encounter for screening mammogram for malignant neoplasm of breast: Secondary | ICD-10-CM

## 2010-06-08 ENCOUNTER — Other Ambulatory Visit: Payer: Self-pay | Admitting: Oncology

## 2010-06-08 ENCOUNTER — Encounter (HOSPITAL_BASED_OUTPATIENT_CLINIC_OR_DEPARTMENT_OTHER): Payer: Medicare Other | Admitting: Oncology

## 2010-06-08 DIAGNOSIS — C9210 Chronic myeloid leukemia, BCR/ABL-positive, not having achieved remission: Secondary | ICD-10-CM

## 2010-06-08 LAB — CBC WITH DIFFERENTIAL/PLATELET
BASO%: 0.9 % (ref 0.0–2.0)
Basophils Absolute: 0 10e3/uL (ref 0.0–0.1)
EOS%: 9.9 % — ABNORMAL HIGH (ref 0.0–7.0)
Eosinophils Absolute: 0.5 10e3/uL (ref 0.0–0.5)
HCT: 31.7 % — ABNORMAL LOW (ref 34.8–46.6)
HGB: 11.1 g/dL — ABNORMAL LOW (ref 11.6–15.9)
LYMPH%: 28.9 % (ref 14.0–49.7)
MCH: 34.2 pg — ABNORMAL HIGH (ref 25.1–34.0)
MCHC: 35.1 g/dL (ref 31.5–36.0)
MCV: 97.5 fL (ref 79.5–101.0)
MONO#: 0.5 10e3/uL (ref 0.1–0.9)
MONO%: 8.6 % (ref 0.0–14.0)
NEUT#: 2.9 10e3/uL (ref 1.5–6.5)
NEUT%: 51.7 % (ref 38.4–76.8)
Platelets: 193 10e3/uL (ref 145–400)
RBC: 3.25 10e6/uL — ABNORMAL LOW (ref 3.70–5.45)
RDW: 13 % (ref 11.2–14.5)
WBC: 5.5 10e3/uL (ref 3.9–10.3)
lymph#: 1.6 10e3/uL (ref 0.9–3.3)

## 2010-06-22 NOTE — H&P (Signed)
[name], [name]             ACCOUNT NO.:  [id number]   MEDICAL RECORD NO.:  [id number]          PATIENT TYPE:  INP   LOCATION:  1823                         FACILITY:  [facility]   PHYSICIAN:  Michiel Cowboy, MDDATE OF BIRTH:  04-Jun-1932   DATE OF ADMISSION:  10/30/2007  DATE OF DISCHARGE:                              HISTORY & PHYSICAL   PRIMARY CARE PHYSICIAN:  Donia Guiles, M.D..   HISTORY OF PRESENT ILLNESS:  The patient is a 77 year old female who  presents with nausea and vomiting, and unable to tolerate anything by  p.o., and severe abdominal pain for the past day or so which she  describes as generalized.  The patient had been in her state of health  up to this point.  Denies any fevers.  No chest pain, no shortness of  breath, no localized weakness, but there is generalized weakness.  The  patient had been taking her medications as prescribed per primary care  [name], who gave her Cipro, Flagyl, and some narcotics which she is  not able to tolerate secondary to severe nausea and vomiting every time  she tries to eat something.   SOCIAL HISTORY:  The patient smokes, does not use drugs, drinks alcohol  occasionally at home.   FAMILY HISTORY:  Unremarkable except for HPI.   REVIEW OF SYSTEMS:  As per HPI, otherwise within normal limits.   ALLERGIES:  No known drug allergies.   MEDICATIONS:  As prescribed metronidazole and ciprofloxacin,  hydrocodone, otherwise the patient takes Norvasc starting yesterday and  Lovastatin 40 mg p.o. daily.   PHYSICAL EXAMINATION:  VITAL SIGNS:  Temperature 97.2, blood pressure  118/84, pulse 80, respirations 18, saturating 98% on room air.  GENERAL:  The patient is a thin female, appears to be in no acute  distress, lying down on a stretcher sleeping.  HEENT:  Head nontraumatic.  Several dry mucous membranes.  NECK:  Supple.  No lymphadenopathy noted.  LUNGS:  Clear to auscultation bilaterally.  HEART:  Regular rate and  rhythm.  No murmurs, rubs, or gallops.  ABDOMEN:  Diffusely tender with diffuse guarding.  No rebound, guarding,  EXTREMITIES:  Lower extremities without edema.  NEUROLOGIC:  Strength 5/5.  Cranial nerves II-XII intact.   LABORATORY DATA:  White blood cell count 20.3, hemoglobin 14.4.  Sodium  136, potassium 2.9, creatinine 0.88, calcium 7.9.  Lipase 199.  UA shows  hematuria.   Chest x-ray shows possible COPD.  KUB within normal limits.  CT scan of  her abdomen shows diffuse mesenteric edema abutting the inferior edge of  the pancreas, but diagnosis of pancreatitis by itself is less likely.  CT of the pelvis is less remarkable.  Appendix within normal limits.   ASSESSMENT/PLAN:  This is a 77 year old female with history of nausea,  vomiting, elevated lipase, and possible pancreatitis versus diffuse  inflammatory bowel disorder.  1. Possible pancreatitis versus inflammatory bowel disorder versus      colitis.  Will make n.p.o.  Allow only ice chips for right now and      post midnight give IV fluids.  If cannot rule  out colitis currently      and also could not rule out possible ischemic colitis, will give      antibiotics, Cipro and Flagyl, for now IV.  In case the patient      does have loose stools will send for a stool white blood cell      count, stool studies.  Treat symptomatically for nausea with Zofran      for white blood cell count.  If the patient persists to have severe      abdominal pain or her abdominal exam worsens with the pain, surgery      consult versus repeat imaging to see if there is any localizing      drain of process.  Also try to obtain lactate.  Determine if there      are Hemoccult stools to see if there is evidence of ischemic      colitis possibly.  Since we could not rule out pancreatitis we will      start Statin, obtain right upper quadrant ultrasound to evaluate      for gallstones.  For lipase although clinically probably less      helpful.  Will  check fasting lipid panel for hypertriglyceridemia.  2. History of hypertension, currently slightly hypotensive.  Will hold      Norvasc.  3. History of hyperlipidemia.  Hold Statins for now.  4. Hypokalemia.  Will replace.  5. Prophylaxis.  Protonix and sequential compression devices.  Will      avoid Lovenox for now until sure that there is no surgical      indication and that the patient does not have true pancreatitis.      Later on the patient will probably need to be switched to Lovenox.   Kela Millin, M.D. will assume care in the morning.      Michiel Cowboy, MD  Electronically Signed     AVD/MEDQ  D:  10/30/2007  T:  10/30/2007  Job:  [id number]

## 2010-06-22 NOTE — Op Note (Signed)
NAMELOYE, REININGER             ACCOUNT NO.:  [id number]   MEDICAL RECORD NO.:  [id number]          PATIENT TYPE:  OIB   LOCATION:  5123                         FACILITY:  [facility]   PHYSICIAN:  Almond Lint, MD       DATE OF BIRTH:  [birth year]   DATE OF PROCEDURE:  12/24/2007  DATE OF DISCHARGE:                               OPERATIVE REPORT   PREOPERATIVE DIAGNOSIS:  Symptomatic cholelithiasis.   POSTOPERATIVE DIAGNOSIS:  Symptomatic cholelithiasis.   PROCEDURE PERFORMED:  Laparoscopic cholecystectomy.   SURGEON:  Almond Lint, MD   ASSISTANT:  Letha Cape, PA-C   ANESTHESIA:  General and local.   FINDINGS:  Small floppy gallbladder.   Gallbladder to pathology.   ESTIMATED BLOOD LOSS:  Minimal.   COMPLICATIONS:  None known.   PROCEDURE:  [name] is a 77 year old female with symptomatic  cholelithiasis.  She was identified in the holding area and taken to the  operating room where she was placed supine on the operating room table.  General anesthesia was induced.  Time-out was performed confirming the  patient, the site of surgery, preoperative antibiotic administration,  absence of diabetes, operating room staff, and equipment.  All was  correct, so we continued.  The infraumbilical skin was anesthetized with  local anesthesia.  A vertical incision was made in the midline just  entering umbilicus approximately 1.3 cm in length.  The subcutaneous  tissue was grabbed with a Tresa Endo and the umbilical stalk elevated with a  Kocher clamp.  The Kocher clamps were then adjusted beyond the fascia on  either side of the midline.  This was entered sharply with an 11 blade.   A Kelly clamp was used to confirm entrance into the peritoneal cavity.  A 0 Vicryl was placed in pursestring fashion around the fascial  incision.  The Hasson trocar was introduced into the abdomen and  pneumoperitoneum was achieved.  The patient was placed in head-up  position, rotated to the left.   The gallbladder was seen immediately,  and appropriate sites for the inner ports were located.  Local  anesthesia was administered in the epigastrium, and an 11 mm incision  was made with a #11 blade.  A 11-mm port was advanced through the  abdominal wall under direct visualization. Two 5-mm trocars were placed  similarly.  Locking graspers were used to elevate the gallbladder fundus  towards the head.   There were few adhesions to the omentum and these were taken down with  cautery.  The infundibulum was then grasped with another locking grasper  and retracted laterally.  The peritoneum was stripped off of these  cystic duct.  This was skeletonized as well as the cystic artery.  A  critical view was obtained all the way up to the gallbladder fossa  making sure there were no other tubular structures in this region.  Three clips were placed on the cystic duct proximally 2 clips were  placed on the patient site and 1 on the specimen site.  The ducts and  artery were transected and then the hook electrocautery was used to take  the gallbladder from the gallbladder fossa.  At one point toward the  fundus, the gallbladder was entered and there was small amount of  biliary sludge.  There was no leaking seen directly from the gallbladder  fossa.  Once this was completely removed from the liver bed, the  gallbladder was placed into an Endocatch bag and pulled out using  umbilical port.  The pneumoperitoneum was re achieved and the  gallbladder fossa was inspected.   There was a couple of sites of oozing and these were coagulated.  The  gallbladder fossa was irrigated copiously to make sure there were no  sites of biliary leakage or bleeding.  Once again, it was irrigated  until the irrigant drained clear.  The epigastric and right upper  quadrant ports were removed under direct visualization.  There was no  bleeding seen.  Pneumoperitoneum was allowed to escape.  The pursestring  suture was  closed at the umbilicus and there was no fascial defect  palpable.  Skin was closed using 4-0 Monocryl and then cleaned, dried  and dressed with Dermabond.  The patient was awakened from anesthesia  and taken to PACU in stable condition.  Needle and instruments counts  were correct x2.      Almond Lint, MD  Electronically Signed     FB/MEDQ  D:  12/24/2007  T:  12/25/2007  Job:  [id number]

## 2010-06-22 NOTE — Consult Note (Signed)
NAMEBEUNA, BOLDING NO.:  [id number]   MEDICAL RECORD NO.:  [id number]          PATIENT TYPE:  INP   LOCATION:  1610                         FACILITY:  [facility]   PHYSICIAN:  Almond Lint, MD       DATE OF BIRTH:  July 16, 1932   DATE OF CONSULTATION:  11/01/2007  DATE OF DISCHARGE:                                 CONSULTATION   REQUESTING PHYSICIAN:  Kela Millin, M.D.   CONSULTING SURGEON:  Almond Lint, MD   GASTROENTEROLOGIST:  Wilhemina Bonito. Marina Goodell, MD   REASON FOR CONSULTATION:  Biliary pancreatitis.   HISTORY OF PRESENT ILLNESS:  [name] is a 77 year old female patient  who reports history of chronic diarrhea, hypertension, dyslipidemia, and  depression who was admitted to the hospital after abrupt onset of  diffuse abdominal pain mainly in the mid and lower abdominal region.  Because of persistent increasing pain and subsequent development of  worsening diarrhea, and nausea and vomiting, she presented to the ER.  Her initial white count was 20,300.  Her LFTs were normal.  Her lipase  was elevated at 599.  Her lactic acid was 2.0.  Urinalysis was  consistent with a possible UTI.  A subsequent CT of the abdomen and  pelvis was done that showed diffuse mesenteric edema and small amount of  the ascites as well as proximal small bowel edema involving the duodenum  and the jejunum where the area of the mesenteric and small bowel was  adjacent to the inferior aspect of the pancreas.  There was a small  amount of focal edema, otherwise the pancreas appeared to be  unremarkable.  She is also seen to have colonic diverticular disease  without evidence of acute diverticulitis.  The patient continues to have  abdominal pain and bloating since admission.  She has been NPO.  She has  been started on Cipro and Flagyl.  An abdominal ultrasound was performed  and it was read as probable gallbladder sludge, otherwise no ductal  dilatation or pericholecystic fluid.  Since  admission, her white count  has decreased to 12,900.  Her lipase is down to 102.  The concern is  that the patient may have a biliary etiology to her symptoms and a  surgical consultation has been requested.   REVIEW OF SYSTEMS:  CONSTITUTIONAL:  As per the history present illness.  GI:  The patient again reports that her symptoms started on Monday  intermittent at first and then became more persistent until they were  unrelenting.  She has never had pain like this before.  She currently is  reporting that she is still passing flatus.  She has had no change in  her chronic diarrhea other than when she was initially presented.  No  hematochezia or melena.   SOCIAL HISTORY:  Former tobacco.  Social alcohol, last drink 3 months  ago.  She is single.   PAST MEDICAL HISTORY:  1. Chronic diarrhea, question irritable bowel syndrome.  2. Depression.  3. Abnormal lipids.  4. Hypertension.   PAST SURGICAL HISTORY:  Nine.   ALLERGIES:  NKDA.  CURRENT MEDICATIONS AT HOME:  Cipro, Flagyl, Norvasc, lovastatin, and  hydrocodone.   PHYSICAL EXAMINATION:  GENERAL:  Pleasant female patient complaining of  lower abdominal pain, but better than prior to admission.  VITAL SIGNS:  Temperature 97.5, BP 108/69, pulse 84, respirations 18.  PSYCH:  The patient is alert and oriented x3.  Her affect is appropriate  to current situation.  NEUROLOGIC:  Cranial nerves II through XII are grossly intact.  The  patient is ambulatory in the room.  No gross motor neuro deficits are  noted.  CHEST:  Bilateral lung sounds are clear to auscultation.  She is on room  air.  She is not tachypneic.  CARDIAC:  Heart sounds are S1 and S2.  No rubs, murmurs, or gallops.  No  JVD.  No peripheral edema.  ABDOMEN:  Distended with no bowel sounds heard, somewhat tympanic.  She  is diffusely tender with guarding in the lower abdominal region, mild  pain in the upper abdominal region.  No bowel sounds are auscultated.   EXTREMITIES:  Symmetrical in appearance without cyanosis or clubbing.   LABORATORY DATA:  Lipase is now 102.  Lactic acid at presentation was  2.0.  LFTs have been normal.  Sodium 137, potassium 3.4, CO2 of 22,  glucose 127, BUN 14, creatinine 0.72, white count now 12,600,  neutrophils 91%, hemoglobin 11.6, AND platelets 239,000.   DIAGNOSTICS:  CT of the abdomen and pelvis as well as ultrasound as  noted.   IMPRESSION:  1. Diffuse abdominal pain in the setting of diffuse mesenteric edema      as well as duodenal and jejunal edema near the pancreas.  2. Clinical pancreatitis.  Based on elevated pancreatic enzymes.  3. Question of biliary sludge with otherwise normal abdominal      ultrasound.  4. Chronic diarrhea.  5. Urinary tract infection.   PLAN:  1. The CT and ultrasound will need to be reviewed by Dr. Donell Beers,      probably with the radiologist.  At this time, I am not completely      convinced that the pancreatitis has a biliary etiology.  Main      concern is that her pain is more in the mid and in the lower      abdominal region.  She has a large amount of mesenteric edema as      well as proximal small bowel edema with only a focal area of      inflammation on the pancreas that is more less related to its      geographical location near the other inflamed intra-abdominal      structures.  There is a concern that the pancreatitis could be      secondary to the inflammatory changes as mentioned above as in some      other bowel process such as colitis, ischemia, etc.  2. We will go ahead check lactic acid in the morning, this has      returned to normal and was trending down.  Suspect the Regional      elevation was due to dehydration, nausea, and vomiting.  If this      number is still elevated, consider CTA of the abdomen or MRA of the      abdomen to look at vascular ischemic etiology to the mesenteric      edema.  The patient has not reported any lower GI bleeding       consistent with acute ischemic colitis.  3. We will go ahead and check two-view abdominal x-ray because of her      persistent distention and tenderness.  Despite her reports passing      flatus, need to rule out obstructive process and/or potential for      free air that has not shown of initially.  4. The patient is determined to have a truly biliary etiology to all      these symptoms, then given the degree of      mesenteric edema and small bowel edema, we would need to wait for      this to completely resolve before proceeding with operative      intervention.  The patient, otherwise improves and we determine      that biliary is the etiology.  She may need to come back electively      to have her gallbladder out in 2-4 weeks.       [name], N.P.      Almond Lint, MD  Electronically Signed    ALE/MEDQ  D:  11/01/2007  T:  11/02/2007  Job:  [id number]

## 2010-06-23 ENCOUNTER — Ambulatory Visit
Admission: RE | Admit: 2010-06-23 | Discharge: 2010-06-23 | Disposition: A | Discharge status: Home / Self Care | Payer: Medicare Other | Source: Ambulatory Visit | Attending: Family Medicine | Admitting: Family Medicine

## 2010-06-23 DIAGNOSIS — Z1231 Encounter for screening mammogram for malignant neoplasm of breast: Secondary | ICD-10-CM

## 2010-07-20 ENCOUNTER — Encounter (HOSPITAL_BASED_OUTPATIENT_CLINIC_OR_DEPARTMENT_OTHER): Payer: Medicare Other | Admitting: Oncology

## 2010-07-20 ENCOUNTER — Other Ambulatory Visit: Payer: Self-pay | Admitting: Oncology

## 2010-07-20 DIAGNOSIS — T451X5A Adverse effect of antineoplastic and immunosuppressive drugs, initial encounter: Secondary | ICD-10-CM

## 2010-07-20 DIAGNOSIS — C9210 Chronic myeloid leukemia, BCR/ABL-positive, not having achieved remission: Secondary | ICD-10-CM

## 2010-07-20 DIAGNOSIS — R609 Edema, unspecified: Secondary | ICD-10-CM

## 2010-07-20 LAB — CBC WITH DIFFERENTIAL/PLATELET
BASO%: 1.2 % (ref 0.0–2.0)
Basophils Absolute: 0.1 10*3/uL (ref 0.0–0.1)
EOS%: 10.1 % — ABNORMAL HIGH (ref 0.0–7.0)
Eosinophils Absolute: 0.5 10*3/uL (ref 0.0–0.5)
HCT: 33.3 % — ABNORMAL LOW (ref 34.8–46.6)
HGB: 11.6 g/dL (ref 11.6–15.9)
LYMPH%: 26.6 % (ref 14.0–49.7)
MCH: 34.1 pg — ABNORMAL HIGH (ref 25.1–34.0)
MCHC: 35 g/dL (ref 31.5–36.0)
MCV: 97.4 fL (ref 79.5–101.0)
MONO#: 0.4 10*3/uL (ref 0.1–0.9)
MONO%: 8.5 % (ref 0.0–14.0)
NEUT#: 2.8 10*3/uL (ref 1.5–6.5)
NEUT%: 53.6 % (ref 38.4–76.8)
Platelets: 185 10*3/uL (ref 145–400)
RBC: 3.42 10*6/uL — ABNORMAL LOW (ref 3.70–5.45)
RDW: 13.1 % (ref 11.2–14.5)
WBC: 5.3 10*3/uL (ref 3.9–10.3)
lymph#: 1.4 10*3/uL (ref 0.9–3.3)

## 2010-07-20 LAB — COMPREHENSIVE METABOLIC PANEL
ALT: 12 U/L (ref 0–35)
AST: 19 U/L (ref 0–37)
Albumin: 4.3 g/dL (ref 3.5–5.2)
Alkaline Phosphatase: 33 U/L — ABNORMAL LOW (ref 39–117)
BUN: 18 mg/dL (ref 6–23)
CO2: 25 mEq/L (ref 19–32)
Calcium: 9.1 mg/dL (ref 8.4–10.5)
Chloride: 105 mEq/L (ref 96–112)
Creatinine, Ser: 1.05 mg/dL (ref 0.50–1.10)
Glucose, Bld: 87 mg/dL (ref 70–99)
Potassium: 3.7 mEq/L (ref 3.5–5.3)
Sodium: 141 mEq/L (ref 135–145)
Total Bilirubin: 0.6 mg/dL (ref 0.3–1.2)
Total Protein: 6.2 g/dL (ref 6.0–8.3)

## 2010-07-23 LAB — BCR/ABL (LIO MMD)

## 2010-08-31 ENCOUNTER — Other Ambulatory Visit: Payer: Self-pay | Admitting: Oncology

## 2010-08-31 ENCOUNTER — Encounter (HOSPITAL_BASED_OUTPATIENT_CLINIC_OR_DEPARTMENT_OTHER): Payer: Medicare Other | Admitting: Oncology

## 2010-08-31 DIAGNOSIS — C9210 Chronic myeloid leukemia, BCR/ABL-positive, not having achieved remission: Secondary | ICD-10-CM

## 2010-08-31 LAB — CBC WITH DIFFERENTIAL/PLATELET
BASO%: 0.5 % (ref 0.0–2.0)
Basophils Absolute: 0 10*3/uL (ref 0.0–0.1)
EOS%: 10.6 % — ABNORMAL HIGH (ref 0.0–7.0)
Eosinophils Absolute: 0.6 10*3/uL — ABNORMAL HIGH (ref 0.0–0.5)
HCT: 33.9 % — ABNORMAL LOW (ref 34.8–46.6)
HGB: 11.8 g/dL (ref 11.6–15.9)
LYMPH%: 30.6 % (ref 14.0–49.7)
MCH: 32.9 pg (ref 25.1–34.0)
MCHC: 34.8 g/dL (ref 31.5–36.0)
MCV: 94.4 fL (ref 79.5–101.0)
MONO#: 0.6 10*3/uL (ref 0.1–0.9)
MONO%: 10.1 % (ref 0.0–14.0)
NEUT#: 2.8 10*3/uL (ref 1.5–6.5)
NEUT%: 48.2 % (ref 38.4–76.8)
Platelets: 204 10*3/uL (ref 145–400)
RBC: 3.59 10*6/uL — ABNORMAL LOW (ref 3.70–5.45)
RDW: 13 % (ref 11.2–14.5)
WBC: 5.8 10*3/uL (ref 3.9–10.3)
lymph#: 1.8 10*3/uL (ref 0.9–3.3)

## 2010-08-31 LAB — BASIC METABOLIC PANEL
BUN: 20 mg/dL (ref 6–23)
CO2: 25 mEq/L (ref 19–32)
Calcium: 9.5 mg/dL (ref 8.4–10.5)
Chloride: 107 mEq/L (ref 96–112)
Creatinine, Ser: 1.14 mg/dL — ABNORMAL HIGH (ref 0.50–1.10)
Glucose, Bld: 129 mg/dL — ABNORMAL HIGH (ref 70–99)
Potassium: 3.6 mEq/L (ref 3.5–5.3)
Sodium: 142 mEq/L (ref 135–145)

## 2010-10-14 ENCOUNTER — Other Ambulatory Visit: Payer: Self-pay | Admitting: Oncology

## 2010-10-14 ENCOUNTER — Encounter (HOSPITAL_BASED_OUTPATIENT_CLINIC_OR_DEPARTMENT_OTHER): Payer: Medicare Other | Admitting: Oncology

## 2010-10-14 DIAGNOSIS — C9210 Chronic myeloid leukemia, BCR/ABL-positive, not having achieved remission: Secondary | ICD-10-CM

## 2010-10-14 DIAGNOSIS — R197 Diarrhea, unspecified: Secondary | ICD-10-CM

## 2010-10-14 LAB — COMPREHENSIVE METABOLIC PANEL
ALT: 13 U/L (ref 0–35)
AST: 19 U/L (ref 0–37)
Albumin: 3.9 g/dL (ref 3.5–5.2)
Alkaline Phosphatase: 34 U/L — ABNORMAL LOW (ref 39–117)
BUN: 18 mg/dL (ref 6–23)
CO2: 27 mEq/L (ref 19–32)
Calcium: 8.6 mg/dL (ref 8.4–10.5)
Chloride: 106 mEq/L (ref 96–112)
Creatinine, Ser: 1.18 mg/dL — ABNORMAL HIGH (ref 0.50–1.10)
Glucose, Bld: 112 mg/dL — ABNORMAL HIGH (ref 70–99)
Potassium: 3.7 mEq/L (ref 3.5–5.3)
Sodium: 140 mEq/L (ref 135–145)
Total Bilirubin: 0.6 mg/dL (ref 0.3–1.2)
Total Protein: 5.8 g/dL — ABNORMAL LOW (ref 6.0–8.3)

## 2010-10-14 LAB — CBC WITH DIFFERENTIAL/PLATELET
BASO%: 0.8 % (ref 0.0–2.0)
Basophils Absolute: 0 10*3/uL (ref 0.0–0.1)
EOS%: 9.6 % — ABNORMAL HIGH (ref 0.0–7.0)
Eosinophils Absolute: 0.4 10*3/uL (ref 0.0–0.5)
HCT: 32.8 % — ABNORMAL LOW (ref 34.8–46.6)
HGB: 11.6 g/dL (ref 11.6–15.9)
LYMPH%: 29.1 % (ref 14.0–49.7)
MCH: 34.5 pg — ABNORMAL HIGH (ref 25.1–34.0)
MCHC: 35.3 g/dL (ref 31.5–36.0)
MCV: 97.6 fL (ref 79.5–101.0)
MONO#: 0.4 10*3/uL (ref 0.1–0.9)
MONO%: 10.8 % (ref 0.0–14.0)
NEUT#: 2 10*3/uL (ref 1.5–6.5)
NEUT%: 49.7 % (ref 38.4–76.8)
Platelets: 172 10*3/uL (ref 145–400)
RBC: 3.36 10*6/uL — ABNORMAL LOW (ref 3.70–5.45)
RDW: 13 % (ref 11.2–14.5)
WBC: 4 10*3/uL (ref 3.9–10.3)
lymph#: 1.2 10*3/uL (ref 0.9–3.3)

## 2010-10-19 LAB — BCR/ABL (LIO MMD)

## 2010-11-08 LAB — BASIC METABOLIC PANEL
BUN: 1 — ABNORMAL LOW
BUN: 14
BUN: 2 — ABNORMAL LOW
BUN: 5 — ABNORMAL LOW
BUN: 6
CO2: 20
CO2: 21
CO2: 22
CO2: 25
CO2: 30
Calcium: 4.9 — CL
Calcium: 5.2 — CL
Calcium: 5.7 — CL
Calcium: 6.4 — CL
Calcium: 6.8 — ABNORMAL LOW
Chloride: 106
Chloride: 107
Chloride: 107
Chloride: 98
Chloride: 99
Creatinine, Ser: 0.6
Creatinine, Ser: 0.62
Creatinine, Ser: 0.69
Creatinine, Ser: 0.72
Creatinine, Ser: 0.75
GFR calc Af Amer: >60
GFR calc Af Amer: >60
GFR calc Af Amer: >60
GFR calc Af Amer: >60
GFR calc Af Amer: >60
GFR calc non Af Amer: >60
GFR calc non Af Amer: >60
GFR calc non Af Amer: >60
GFR calc non Af Amer: >60
GFR calc non Af Amer: >60
Glucose, Bld: 108 — ABNORMAL HIGH
Glucose, Bld: 127 — ABNORMAL HIGH
Glucose, Bld: 152 — ABNORMAL HIGH
Glucose, Bld: 92
Glucose, Bld: 93
Potassium: 3 — ABNORMAL LOW
Potassium: 3.1 — ABNORMAL LOW
Potassium: 3.2 — ABNORMAL LOW
Potassium: 3.4 — ABNORMAL LOW
Potassium: 3.4 — ABNORMAL LOW
Sodium: 132 — ABNORMAL LOW
Sodium: 133 — ABNORMAL LOW
Sodium: 135
Sodium: 136
Sodium: 137

## 2010-11-08 LAB — URINALYSIS, ROUTINE W REFLEX MICROSCOPIC
Bilirubin Urine: NEGATIVE
Bilirubin Urine: NEGATIVE
Glucose, UA: NEGATIVE
Glucose, UA: NEGATIVE
Hgb urine dipstick: NEGATIVE
Hgb urine dipstick: NEGATIVE
Ketones, ur: 15 — AB
Ketones, ur: 15 — AB
Nitrite: NEGATIVE
Nitrite: NEGATIVE
Protein, ur: 100 — AB
Protein, ur: 30 — AB
Specific Gravity, Urine: 1.008
Specific Gravity, Urine: >1.046 — ABNORMAL HIGH
Urobilinogen, UA: 0.2
Urobilinogen, UA: 0.2
pH: 6
pH: 7

## 2010-11-08 LAB — CBC
HCT: 28.4 — ABNORMAL LOW
HCT: 29.2 — ABNORMAL LOW
HCT: 30 — ABNORMAL LOW
HCT: 30.6 — ABNORMAL LOW
HCT: 30.8 — ABNORMAL LOW
HCT: 34.1 — ABNORMAL LOW
HCT: 41.1
HCT: 41.8
Hemoglobin: 10 — ABNORMAL LOW
Hemoglobin: 10.1 — ABNORMAL LOW
Hemoglobin: 10.5 — ABNORMAL LOW
Hemoglobin: 10.7 — ABNORMAL LOW
Hemoglobin: 10.8 — ABNORMAL LOW
Hemoglobin: 11.6 — ABNORMAL LOW
Hemoglobin: 14.2
Hemoglobin: 14.4
MCHC: 34
MCHC: 34.1
MCHC: 34.4
MCHC: 35
MCHC: 35
MCHC: 35
MCHC: 35.2
MCHC: 35.2
MCV: 87.8
MCV: 88.8
MCV: 89
MCV: 89.1
MCV: 89.2
MCV: 90.5
MCV: 90.5
MCV: 90.9
Platelets: 236
Platelets: 239
Platelets: 278
Platelets: 305
Platelets: 311
Platelets: 312
Platelets: 314
Platelets: 342
RBC: 3.23 — ABNORMAL LOW
RBC: 3.28 — ABNORMAL LOW
RBC: 3.38 — ABNORMAL LOW
RBC: 3.38 — ABNORMAL LOW
RBC: 3.47 — ABNORMAL LOW
RBC: 3.76 — ABNORMAL LOW
RBC: 4.6
RBC: 4.61
RDW: 12.8
RDW: 12.8
RDW: 12.8
RDW: 12.9
RDW: 13
RDW: 13
RDW: 13
RDW: 13.3
WBC: 12.9 — ABNORMAL HIGH
WBC: 13.8 — ABNORMAL HIGH
WBC: 16 — ABNORMAL HIGH
WBC: 17 — ABNORMAL HIGH
WBC: 20.3 — ABNORMAL HIGH
WBC: 21.6 — ABNORMAL HIGH
WBC: 9
WBC: 9.2

## 2010-11-08 LAB — LIPID PANEL
Cholesterol: 110
HDL: 33 — ABNORMAL LOW
LDL Cholesterol: 62
Total CHOL/HDL Ratio: 3.3
Triglycerides: 77
VLDL: 15

## 2010-11-08 LAB — LIPASE, BLOOD
Lipase: 102 — ABNORMAL HIGH
Lipase: 30
Lipase: 354 — ABNORMAL HIGH
Lipase: 41
Lipase: 57
Lipase: 599 — ABNORMAL HIGH

## 2010-11-08 LAB — COMPREHENSIVE METABOLIC PANEL
ALT: 12
ALT: 12
ALT: 12
ALT: 13
ALT: 16
AST: 21
AST: 22
AST: 23
AST: 24
AST: 27
Albumin: 1.6 — ABNORMAL LOW
Albumin: 1.6 — ABNORMAL LOW
Albumin: 1.9 — ABNORMAL LOW
Albumin: 2.8 — ABNORMAL LOW
Albumin: 3.4 — ABNORMAL LOW
Alkaline Phosphatase: 39
Alkaline Phosphatase: 40
Alkaline Phosphatase: 47
Alkaline Phosphatase: 75
Alkaline Phosphatase: 76
BUN: 17
BUN: 18
BUN: 2 — ABNORMAL LOW
BUN: 3 — ABNORMAL LOW
BUN: 3 — ABNORMAL LOW
CO2: 22
CO2: 22
CO2: 24
CO2: 27
CO2: 29
Calcium: 5.6 — CL
Calcium: 5.8 — CL
Calcium: 6.9 — ABNORMAL LOW
Calcium: 7 — ABNORMAL LOW
Calcium: 7.1 — ABNORMAL LOW
Chloride: 101
Chloride: 102
Chloride: 104
Chloride: 106
Chloride: 98
Creatinine, Ser: 0.53
Creatinine, Ser: 0.55
Creatinine, Ser: 0.62
Creatinine, Ser: 0.87
Creatinine, Ser: 0.88
GFR calc Af Amer: >60
GFR calc Af Amer: >60
GFR calc Af Amer: >60
GFR calc Af Amer: >60
GFR calc Af Amer: >60
GFR calc non Af Amer: >60
GFR calc non Af Amer: >60
GFR calc non Af Amer: >60
GFR calc non Af Amer: >60
GFR calc non Af Amer: >60
Glucose, Bld: 157 — ABNORMAL HIGH
Glucose, Bld: 173 — ABNORMAL HIGH
Glucose, Bld: 90
Glucose, Bld: 91
Glucose, Bld: 92
Potassium: 2.8 — ABNORMAL LOW
Potassium: 2.9 — ABNORMAL LOW
Potassium: 3.3 — ABNORMAL LOW
Potassium: 3.4 — ABNORMAL LOW
Potassium: 4.2
Sodium: 133 — ABNORMAL LOW
Sodium: 134 — ABNORMAL LOW
Sodium: 135
Sodium: 135
Sodium: 136
Total Bilirubin: 0.7
Total Bilirubin: 0.8
Total Bilirubin: 0.9
Total Bilirubin: 1.1
Total Bilirubin: 1.2
Total Protein: 4.4 — ABNORMAL LOW
Total Protein: 4.4 — ABNORMAL LOW
Total Protein: 4.6 — ABNORMAL LOW
Total Protein: 5.6 — ABNORMAL LOW
Total Protein: 6.3

## 2010-11-08 LAB — DIFFERENTIAL
Basophils Absolute: 0
Basophils Absolute: 0
Basophils Absolute: 0
Basophils Absolute: 0
Basophils Relative: 0
Basophils Relative: 0
Basophils Relative: 0
Basophils Relative: 0
Eosinophils Absolute: 0
Eosinophils Absolute: 0
Eosinophils Absolute: 0.4
Eosinophils Absolute: 0.5
Eosinophils Relative: 0
Eosinophils Relative: 0
Eosinophils Relative: 2
Eosinophils Relative: 3
Lymphocytes Relative: 3 — ABNORMAL LOW
Lymphocytes Relative: 4 — ABNORMAL LOW
Lymphocytes Relative: 4 — ABNORMAL LOW
Lymphocytes Relative: 5 — ABNORMAL LOW
Lymphs Abs: 0.6 — ABNORMAL LOW
Lymphs Abs: 0.6 — ABNORMAL LOW
Lymphs Abs: 0.6 — ABNORMAL LOW
Lymphs Abs: 0.7
Monocytes Absolute: 0.5
Monocytes Absolute: 0.6
Monocytes Absolute: 1.1 — ABNORMAL HIGH
Monocytes Absolute: 1.2 — ABNORMAL HIGH
Monocytes Relative: 3
Monocytes Relative: 4
Monocytes Relative: 7
Monocytes Relative: 7
Neutro Abs: 11.7 — ABNORMAL HIGH
Neutro Abs: 13.8 — ABNORMAL HIGH
Neutro Abs: 14.7 — ABNORMAL HIGH
Neutro Abs: 19.1 — ABNORMAL HIGH
Neutrophils Relative %: 87 — ABNORMAL HIGH
Neutrophils Relative %: 87 — ABNORMAL HIGH
Neutrophils Relative %: 91 — ABNORMAL HIGH
Neutrophils Relative %: 94 — ABNORMAL HIGH

## 2010-11-08 LAB — CULTURE, BLOOD (ROUTINE X 2)
Culture: NO GROWTH
Culture: NO GROWTH
Culture: NO GROWTH
Culture: NO GROWTH

## 2010-11-08 LAB — URINE CULTURE
Colony Count: NO GROWTH
Colony Count: NO GROWTH
Culture: NO GROWTH
Culture: NO GROWTH
Special Requests: NEGATIVE

## 2010-11-08 LAB — URINE MICROSCOPIC-ADD ON

## 2010-11-08 LAB — LACTATE DEHYDROGENASE, PLEURAL OR PERITONEAL FLUID: LD, Fluid: 343 — ABNORMAL HIGH

## 2010-11-08 LAB — BLOOD GAS, ARTERIAL
Acid-Base Excess: 1
Bicarbonate: 24.5 — ABNORMAL HIGH
O2 Content: 3
O2 Saturation: 97
Patient temperature: 98.6
TCO2: 25.6
pCO2 arterial: 35.4
pH, Arterial: 7.455 — ABNORMAL HIGH
pO2, Arterial: 76.2 — ABNORMAL LOW

## 2010-11-08 LAB — BODY FLUID CULTURE: Culture: NO GROWTH

## 2010-11-08 LAB — PROTIME-INR
INR: 1.2
Prothrombin Time: 15.9 — ABNORMAL HIGH

## 2010-11-08 LAB — LACTIC ACID, PLASMA
Lactic Acid, Venous: 0.7
Lactic Acid, Venous: 2

## 2010-11-08 LAB — PHOSPHORUS
Phosphorus: 1.1 — ABNORMAL LOW
Phosphorus: 1.6 — ABNORMAL LOW
Phosphorus: 1.6 — ABNORMAL LOW
Phosphorus: 2 — ABNORMAL LOW
Phosphorus: 2.7
Phosphorus: <1 — CL

## 2010-11-08 LAB — BODY FLUID CELL COUNT WITH DIFFERENTIAL
Eos, Fluid: 2
Lymphs, Fluid: 15
Monocyte-Macrophage-Serous Fluid: 13 — ABNORMAL LOW
Neutrophil Count, Fluid: 70 — ABNORMAL HIGH
Total Nucleated Cell Count, Fluid: 1605 — ABNORMAL HIGH

## 2010-11-08 LAB — CLOSTRIDIUM DIFFICILE EIA: C difficile Toxins A+B, EIA: NEGATIVE

## 2010-11-08 LAB — MAGNESIUM: Magnesium: 1.7

## 2010-11-08 LAB — AMYLASE: Amylase: 85

## 2010-11-08 LAB — ALBUMIN: Albumin: 1.9 — ABNORMAL LOW

## 2010-11-08 LAB — PROTEIN, BODY FLUID: Total protein, fluid: <3

## 2010-11-08 LAB — HEMOGLOBIN A1C
Hgb A1c MFr Bld: 5.2
Mean Plasma Glucose: 103

## 2010-11-08 LAB — APTT: aPTT: 28

## 2010-11-08 LAB — SEDIMENTATION RATE: Sed Rate: 16

## 2010-11-08 LAB — CK: Total CK: 65

## 2010-11-09 LAB — CBC
HCT: 29.3 — ABNORMAL LOW
HCT: 31.2 — ABNORMAL LOW
HCT: 38
Hemoglobin: 10.6 — ABNORMAL LOW
Hemoglobin: 9.8 — ABNORMAL LOW
Hemoglobin: 12.9
MCHC: 33.5
MCHC: 34.1
MCHC: 34
MCV: 88.7
MCV: 90.6
MCV: 87.1
Platelets: 294
Platelets: 348
Platelets: 289
RBC: 3.24 — ABNORMAL LOW
RBC: 3.52 — ABNORMAL LOW
RBC: 4.37
RDW: 12.8
RDW: 13
RDW: 13.6
WBC: 14.1 — ABNORMAL HIGH
WBC: 15.2 — ABNORMAL HIGH
WBC: 5.8

## 2010-11-09 LAB — BASIC METABOLIC PANEL
BUN: 3 — ABNORMAL LOW
BUN: 4 — ABNORMAL LOW
CO2: 26
CO2: 27
Calcium: 7.3 — ABNORMAL LOW
Calcium: 7.9 — ABNORMAL LOW
Chloride: 102
Chloride: 103
Creatinine, Ser: 0.68
Creatinine, Ser: 0.69
GFR calc Af Amer: >60
GFR calc Af Amer: >60
GFR calc non Af Amer: >60
GFR calc non Af Amer: >60
Glucose, Bld: 108 — ABNORMAL HIGH
Glucose, Bld: 122 — ABNORMAL HIGH
Potassium: 3.9
Potassium: 4.7
Sodium: 132 — ABNORMAL LOW
Sodium: 134 — ABNORMAL LOW

## 2010-11-09 LAB — COMPREHENSIVE METABOLIC PANEL
ALT: 16
ALT: 14
AST: 27
AST: 19
Albumin: 1.7 — ABNORMAL LOW
Albumin: 3.4 — ABNORMAL LOW
Alkaline Phosphatase: 82
Alkaline Phosphatase: 52
BUN: 3 — ABNORMAL LOW
BUN: 7
CO2: 26
CO2: 30
Calcium: 7.6 — ABNORMAL LOW
Calcium: 9.6
Chloride: 104
Chloride: 105
Creatinine, Ser: 0.63
Creatinine, Ser: 0.81
GFR calc Af Amer: >60
GFR calc Af Amer: >60
GFR calc non Af Amer: >60
GFR calc non Af Amer: >60
Glucose, Bld: 106 — ABNORMAL HIGH
Glucose, Bld: 112 — ABNORMAL HIGH
Potassium: 4.2
Potassium: 4.2
Sodium: 135
Sodium: 141
Total Bilirubin: 0.8
Total Bilirubin: 0.7
Total Protein: 5 — ABNORMAL LOW
Total Protein: 6.4

## 2010-11-09 LAB — DIFFERENTIAL
Basophils Absolute: 0
Basophils Absolute: 0.1
Basophils Relative: 0
Basophils Relative: 1
Eosinophils Absolute: 0.3
Eosinophils Absolute: 0.3
Eosinophils Relative: 2
Eosinophils Relative: 5
Lymphocytes Relative: 4 — ABNORMAL LOW
Lymphocytes Relative: 23
Lymphs Abs: 0.7
Lymphs Abs: 1.3
Monocytes Absolute: 0.7
Monocytes Absolute: 0.4
Monocytes Relative: 5
Monocytes Relative: 8
Neutro Abs: 13.4 — ABNORMAL HIGH
Neutro Abs: 3.7
Neutrophils Relative %: 88 — ABNORMAL HIGH
Neutrophils Relative %: 64

## 2010-11-09 LAB — APTT: aPTT: 28

## 2010-11-09 LAB — PHOSPHORUS
Phosphorus: 2.7
Phosphorus: 2.7

## 2010-11-09 LAB — MAGNESIUM: Magnesium: 1.9

## 2010-11-09 LAB — PROTIME-INR
INR: 1
Prothrombin Time: 13.5

## 2010-11-09 LAB — TSH: TSH: 4.609 — ABNORMAL HIGH

## 2010-11-09 LAB — LIPASE, BLOOD: Lipase: 40

## 2010-11-23 ENCOUNTER — Other Ambulatory Visit: Payer: Self-pay | Admitting: Oncology

## 2010-11-23 ENCOUNTER — Encounter (HOSPITAL_BASED_OUTPATIENT_CLINIC_OR_DEPARTMENT_OTHER): Payer: Medicare Other | Admitting: Oncology

## 2010-11-23 DIAGNOSIS — C9110 Chronic lymphocytic leukemia of B-cell type not having achieved remission: Secondary | ICD-10-CM

## 2010-11-23 DIAGNOSIS — T451X5A Adverse effect of antineoplastic and immunosuppressive drugs, initial encounter: Secondary | ICD-10-CM

## 2010-11-23 DIAGNOSIS — R609 Edema, unspecified: Secondary | ICD-10-CM

## 2010-11-23 DIAGNOSIS — C9210 Chronic myeloid leukemia, BCR/ABL-positive, not having achieved remission: Secondary | ICD-10-CM

## 2010-11-23 LAB — BASIC METABOLIC PANEL
BUN: 15 mg/dL (ref 6–23)
CO2: 24 mEq/L (ref 19–32)
Calcium: 9.2 mg/dL (ref 8.4–10.5)
Chloride: 107 mEq/L (ref 96–112)
Creatinine, Ser: 1.07 mg/dL (ref 0.50–1.10)
Glucose, Bld: 94 mg/dL (ref 70–99)
Potassium: 3.9 mEq/L (ref 3.5–5.3)
Sodium: 141 mEq/L (ref 135–145)

## 2010-11-23 LAB — CBC WITH DIFFERENTIAL/PLATELET
BASO%: 0.8 % (ref 0.0–2.0)
Basophils Absolute: 0 10*3/uL (ref 0.0–0.1)
EOS%: 10.3 % — ABNORMAL HIGH (ref 0.0–7.0)
Eosinophils Absolute: 0.5 10*3/uL (ref 0.0–0.5)
HCT: 34 % — ABNORMAL LOW (ref 34.8–46.6)
HGB: 12 g/dL (ref 11.6–15.9)
LYMPH%: 29.3 % (ref 14.0–49.7)
MCH: 34.3 pg — ABNORMAL HIGH (ref 25.1–34.0)
MCHC: 35.2 g/dL (ref 31.5–36.0)
MCV: 97.5 fL (ref 79.5–101.0)
MONO#: 0.5 10*3/uL (ref 0.1–0.9)
MONO%: 9.1 % (ref 0.0–14.0)
NEUT#: 2.6 10*3/uL (ref 1.5–6.5)
NEUT%: 50.5 % (ref 38.4–76.8)
Platelets: 215 10*3/uL (ref 145–400)
RBC: 3.49 10*6/uL — ABNORMAL LOW (ref 3.70–5.45)
RDW: 13.1 % (ref 11.2–14.5)
WBC: 5.1 10*3/uL (ref 3.9–10.3)
lymph#: 1.5 10*3/uL (ref 0.9–3.3)

## 2010-12-22 ENCOUNTER — Other Ambulatory Visit: Payer: Self-pay

## 2010-12-22 ENCOUNTER — Telehealth: Payer: Self-pay

## 2010-12-22 DIAGNOSIS — C9210 Chronic myeloid leukemia, BCR/ABL-positive, not having achieved remission: Secondary | ICD-10-CM

## 2010-12-22 MED ORDER — IMATINIB MESYLATE 100 MG PO TABS: ORAL_TABLET | ORAL | Status: DC

## 2010-12-22 NOTE — Telephone Encounter (Signed)
No Note

## 2010-12-23 ENCOUNTER — Encounter: Payer: Self-pay | Admitting: *Deleted

## 2010-12-23 ENCOUNTER — Other Ambulatory Visit: Payer: Self-pay | Admitting: *Deleted

## 2010-12-23 NOTE — Progress Notes (Signed)
RECEIVED A FAX FROM [facility] CONCERNING A CONFIRMATION OF FACSIMILE RECEIPT. 

## 2011-01-19 ENCOUNTER — Encounter: Payer: Self-pay | Admitting: *Deleted

## 2011-01-19 DIAGNOSIS — C9210 Chronic myeloid leukemia, BCR/ABL-positive, not having achieved remission: Secondary | ICD-10-CM

## 2011-01-20 ENCOUNTER — Other Ambulatory Visit (HOSPITAL_BASED_OUTPATIENT_CLINIC_OR_DEPARTMENT_OTHER): Payer: Medicare Other | Admitting: Lab

## 2011-01-20 ENCOUNTER — Telehealth: Payer: Self-pay | Admitting: Oncology

## 2011-01-20 ENCOUNTER — Ambulatory Visit (HOSPITAL_BASED_OUTPATIENT_CLINIC_OR_DEPARTMENT_OTHER): Payer: Medicare Other | Admitting: Oncology

## 2011-01-20 ENCOUNTER — Other Ambulatory Visit: Payer: Self-pay | Admitting: Oncology

## 2011-01-20 DIAGNOSIS — T451X5A Adverse effect of antineoplastic and immunosuppressive drugs, initial encounter: Secondary | ICD-10-CM

## 2011-01-20 DIAGNOSIS — R197 Diarrhea, unspecified: Secondary | ICD-10-CM

## 2011-01-20 DIAGNOSIS — H5789 Other specified disorders of eye and adnexa: Secondary | ICD-10-CM

## 2011-01-20 DIAGNOSIS — C9210 Chronic myeloid leukemia, BCR/ABL-positive, not having achieved remission: Secondary | ICD-10-CM

## 2011-01-20 DIAGNOSIS — R609 Edema, unspecified: Secondary | ICD-10-CM

## 2011-01-20 LAB — COMPREHENSIVE METABOLIC PANEL
ALT: 11 U/L (ref 0–35)
AST: 19 U/L (ref 0–37)
Albumin: 4 g/dL (ref 3.5–5.2)
Alkaline Phosphatase: 34 U/L — ABNORMAL LOW (ref 39–117)
BUN: 14 mg/dL (ref 6–23)
CO2: 28 mEq/L (ref 19–32)
Calcium: 9.3 mg/dL (ref 8.4–10.5)
Chloride: 106 mEq/L (ref 96–112)
Creatinine, Ser: 0.94 mg/dL (ref 0.50–1.10)
Glucose, Bld: 85 mg/dL (ref 70–99)
Potassium: 3.9 mEq/L (ref 3.5–5.3)
Sodium: 141 mEq/L (ref 135–145)
Total Bilirubin: 0.6 mg/dL (ref 0.3–1.2)
Total Protein: 5.9 g/dL — ABNORMAL LOW (ref 6.0–8.3)

## 2011-01-20 LAB — CBC WITH DIFFERENTIAL/PLATELET
BASO%: 1.1 % (ref 0.0–2.0)
Basophils Absolute: 0.1 10*3/uL (ref 0.0–0.1)
EOS%: 9.3 % — ABNORMAL HIGH (ref 0.0–7.0)
Eosinophils Absolute: 0.5 10*3/uL (ref 0.0–0.5)
HCT: 33.7 % — ABNORMAL LOW (ref 34.8–46.6)
HGB: 11.8 g/dL (ref 11.6–15.9)
LYMPH%: 29.8 % (ref 14.0–49.7)
MCH: 34.3 pg — ABNORMAL HIGH (ref 25.1–34.0)
MCHC: 35 g/dL (ref 31.5–36.0)
MCV: 97.9 fL (ref 79.5–101.0)
MONO#: 0.5 10*3/uL (ref 0.1–0.9)
MONO%: 10.1 % (ref 0.0–14.0)
NEUT#: 2.4 10*3/uL (ref 1.5–6.5)
NEUT%: 49.7 % (ref 38.4–76.8)
Platelets: 213 10*3/uL (ref 145–400)
RBC: 3.45 10*6/uL — ABNORMAL LOW (ref 3.70–5.45)
RDW: 12.9 % (ref 11.2–14.5)
WBC: 4.9 10*3/uL (ref 3.9–10.3)
lymph#: 1.4 10*3/uL (ref 0.9–3.3)

## 2011-01-20 NOTE — Progress Notes (Signed)
Some periorbital edema noted Loose stool several/day--will try taking OTC Imodium daily

## 2011-01-20 NOTE — Telephone Encounter (Signed)
gv pt appt schedule for jan/march.

## 2011-01-21 NOTE — Progress Notes (Signed)
OFFICE PROGRESS NOTE   INTERVAL HISTORY:   She returns as scheduled. She continues Gleevec. She has no new complaint. She reports several bowel movements per day. The eyes remain swollen. She continues to have increased tearing. She believes this could be related to the Gleevec or "allergies"  Objective:  Vital signs in last 24 hours:  Blood pressure 134/67, pulse 52, temperature 97.3 F (36.3 C), temperature source Oral, height 5\' 3"  (1.6 m), weight 120 lb 8 oz (54.658 kg).    HEENT: Mild periorbital edema. No thrush or ulcers Resp: Lungs clear bilaterally Cardio: Regular rate and rhythm with occasional premature beats GI: No hepatosplenomegaly. Vascular: No leg edema  Skin: No rash   Lab Results:  Lab Results  Component Value Date   WBC 4.9 01/20/2011   HGB 11.8 01/20/2011   HCT 33.7* 01/20/2011   MCV 97.9 01/20/2011   PLT 213 01/20/2011      Medications: I have reviewed the patient's current medications.  Assessment/Plan: 1. Chronic myelogenous leukemia -- initially treated with hydroxyurea. She began Gleevec on 08/27/2009.  Gleevec was placed on hold on 10/06/2009 due to poor tolerance of the 400 mg daily dose.  Gleevec was restarted when she had hematologic improvement on 10/13/2009 and the dose was changed to 300 mg on 10/26/2009.   The Gleevec dose was reduced to 200 mg daily due to excessive "tearing".   Overall she has tolerated the 200 mg dose with minimal toxicity.  2. Diarrhea -- she is taking over-the-counter anti-diarrheal medication as needed. 3. Periorbital edema secondary to Gleevec. Stable 4. Increased "tearing"  ?Related to Gleevec or allergies. .   Disposition:  She appears unchanged. She remains in hematologic remission from the [facility]. We obtained a peripheral blood PCR today. I discussed switching from Gleevec to a different tyrosine kinase inhibitor. This could potentially alleviate the periorbital edema and "tearing". She is most comfortable  continuing the Gleevec at present.  She will return for a lab visit in 6 weeks. She is scheduled for a 3 month office visit.   Lucile Shutters, MD  01/21/2011  9:19 AM

## 2011-01-25 LAB — BCR/ABL (LIO MMD)

## 2011-02-07 ENCOUNTER — Telehealth: Payer: Self-pay | Admitting: *Deleted

## 2011-02-07 NOTE — Telephone Encounter (Signed)
BCR/ABL results are "stable" per Dr. Truett Perna. Continue Gleevec and follow up as scheduled.

## 2011-03-01 ENCOUNTER — Other Ambulatory Visit: Payer: Medicare Other | Admitting: Lab

## 2011-03-01 LAB — CBC WITH DIFFERENTIAL/PLATELET
BASO%: 0.5 % (ref 0.0–2.0)
Basophils Absolute: 0 10*3/uL (ref 0.0–0.1)
EOS%: 7.7 % — ABNORMAL HIGH (ref 0.0–7.0)
Eosinophils Absolute: 0.5 10*3/uL (ref 0.0–0.5)
HCT: 34.8 % (ref 34.8–46.6)
HGB: 12.3 g/dL (ref 11.6–15.9)
LYMPH%: 34.5 % (ref 14.0–49.7)
MCH: 34.1 pg — ABNORMAL HIGH (ref 25.1–34.0)
MCHC: 35.3 g/dL (ref 31.5–36.0)
MCV: 96.6 fL (ref 79.5–101.0)
MONO#: 0.5 10*3/uL (ref 0.1–0.9)
MONO%: 8.5 % (ref 0.0–14.0)
NEUT#: 3 10*3/uL (ref 1.5–6.5)
NEUT%: 48.8 % (ref 38.4–76.8)
Platelets: 216 10*3/uL (ref 145–400)
RBC: 3.6 10*6/uL — ABNORMAL LOW (ref 3.70–5.45)
RDW: 12.5 % (ref 11.2–14.5)
WBC: 6.2 10*3/uL (ref 3.9–10.3)
lymph#: 2.1 10*3/uL (ref 0.9–3.3)

## 2011-03-08 ENCOUNTER — Telehealth: Payer: Self-pay | Admitting: *Deleted

## 2011-03-08 NOTE — Telephone Encounter (Signed)
ok 

## 2011-03-08 NOTE — Telephone Encounter (Signed)
Dr. Clelia Croft changed her [name] to Fosamax 70 mg weekly due to cost increase of Boniva.  Wants to be sure OK with Dr. Truett Perna to make this change in relation to her Gleevec and leukemia. Told her it should be OK. Will confirm with physician and let him know if he thinks otherwise.

## 2011-04-12 ENCOUNTER — Telehealth: Payer: Self-pay | Admitting: Oncology

## 2011-04-12 NOTE — Telephone Encounter (Signed)
called pt and infomed her that her appt on 03/12 was changed to start at 1:45pm

## 2011-04-18 ENCOUNTER — Other Ambulatory Visit: Payer: Self-pay | Admitting: *Deleted

## 2011-04-19 ENCOUNTER — Other Ambulatory Visit: Payer: Medicare Other | Admitting: Lab

## 2011-04-19 ENCOUNTER — Other Ambulatory Visit (HOSPITAL_BASED_OUTPATIENT_CLINIC_OR_DEPARTMENT_OTHER): Payer: Medicare Other | Admitting: Lab

## 2011-04-19 ENCOUNTER — Ambulatory Visit: Payer: Medicare Other | Admitting: Nurse Practitioner

## 2011-04-19 ENCOUNTER — Ambulatory Visit (HOSPITAL_BASED_OUTPATIENT_CLINIC_OR_DEPARTMENT_OTHER): Payer: Medicare Other | Admitting: Nurse Practitioner

## 2011-04-19 VITALS — BP 126/71 | HR 63 | Temp 97.7°F | Ht 63.0 in | Wt 118.4 lb

## 2011-04-19 DIAGNOSIS — C9210 Chronic myeloid leukemia, BCR/ABL-positive, not having achieved remission: Secondary | ICD-10-CM

## 2011-04-19 DIAGNOSIS — C9291 Myeloid leukemia, unspecified in remission: Secondary | ICD-10-CM

## 2011-04-19 LAB — COMPREHENSIVE METABOLIC PANEL
ALT: 13 U/L (ref 0–35)
AST: 15 U/L (ref 0–37)
Albumin: 4.4 g/dL (ref 3.5–5.2)
Alkaline Phosphatase: 39 U/L (ref 39–117)
BUN: 19 mg/dL (ref 6–23)
CO2: 23 mEq/L (ref 19–32)
Calcium: 9.9 mg/dL (ref 8.4–10.5)
Chloride: 105 mEq/L (ref 96–112)
Creatinine, Ser: 1.1 mg/dL (ref 0.50–1.10)
Glucose, Bld: 133 mg/dL — ABNORMAL HIGH (ref 70–99)
Potassium: 3.9 mEq/L (ref 3.5–5.3)
Sodium: 140 mEq/L (ref 135–145)
Total Bilirubin: 0.5 mg/dL (ref 0.3–1.2)
Total Protein: 6.3 g/dL (ref 6.0–8.3)

## 2011-04-19 LAB — CBC WITH DIFFERENTIAL/PLATELET
BASO%: 0.8 % (ref 0.0–2.0)
Basophils Absolute: 0 10*3/uL (ref 0.0–0.1)
EOS%: 7.4 % — ABNORMAL HIGH (ref 0.0–7.0)
Eosinophils Absolute: 0.4 10*3/uL (ref 0.0–0.5)
HCT: 35.2 % (ref 34.8–46.6)
HGB: 12 g/dL (ref 11.6–15.9)
LYMPH%: 29 % (ref 14.0–49.7)
MCH: 33.8 pg (ref 25.1–34.0)
MCHC: 34.1 g/dL (ref 31.5–36.0)
MCV: 99 fL (ref 79.5–101.0)
MONO#: 0.5 10*3/uL (ref 0.1–0.9)
MONO%: 8 % (ref 0.0–14.0)
NEUT#: 3.1 10*3/uL (ref 1.5–6.5)
NEUT%: 54.8 % (ref 38.4–76.8)
Platelets: 224 10*3/uL (ref 145–400)
RBC: 3.56 10*6/uL — ABNORMAL LOW (ref 3.70–5.45)
RDW: 13.3 % (ref 11.2–14.5)
WBC: 5.6 10*3/uL (ref 3.9–10.3)
lymph#: 1.6 10*3/uL (ref 0.9–3.3)

## 2011-04-19 NOTE — Progress Notes (Signed)
OFFICE PROGRESS NOTE  Interval history:  [name] is a 78 year old woman with CLL. She is on Gleevec 200 mg daily. She is seen today for scheduled followup.  [name] reports persistent periorbital edema and tearing. She denies leg swelling. She has occasional nausea. No diarrhea.   Objective: Blood pressure 126/71, pulse 63, temperature 97.7 F (36.5 C), temperature source Oral, height 5\' 3"  (1.6 m), weight 118 lb 6.4 oz (53.706 kg).  Bilateral periorbital edema. Oropharynx without thrush or ulceration. No palpable cervical, supraclavicular or axillary lymph nodes. Lungs clear. Regular cardiac rhythm. Abdomen soft and nontender. No organomegaly. Extremities without edema. Calves soft and nontender. No skin rash  Lab Results: Lab Results  Component Value Date   WBC 5.6 04/19/2011   HGB 12.0 04/19/2011   HCT 35.2 04/19/2011   MCV 99.0 04/19/2011   PLT 224 04/19/2011    Chemistry:    Chemistry      Component Value Date/Time   NA 141 01/20/2011 0940   K 3.9 01/20/2011 0940   CL 106 01/20/2011 0940   CO2 28 01/20/2011 0940   BUN 14 01/20/2011 0940   CREATININE 0.94 01/20/2011 0940      Component Value Date/Time   CALCIUM 9.3 01/20/2011 0940   ALKPHOS 34* 01/20/2011 0940   AST 19 01/20/2011 0940   ALT 11 01/20/2011 0940   BILITOT 0.6 01/20/2011 0940       Studies/Results: No results found.  Medications: I have reviewed the patient's current medications.  Assessment/Plan:  1. Chronic myelogenous leukemia -- initially treated with hydroxyurea. She began Gleevec on 08/27/2009. Gleevec was placed on hold on 10/06/2009 due to poor tolerance of the 400 mg daily dose. Gleevec was restarted when she had hematologic improvement on 10/13/2009 and the dose was changed to 300 mg on 10/26/2009. The Gleevec dose was reduced to 200 mg daily due to excessive "tearing". Overall she has tolerated the 200 mg dose with minimal toxicity.  2. Diarrhea -- she denies diarrhea at today's  visit. 3. Periorbital edema secondary to Gleevec. Stable 4. Increased "tearing"; ? related to Gleevec or allergies.   Disposition-[name] appears stable. She remains in hematologic remission from the [facility]. We obtained a peripheral blood PCR today. At the time of her last visit 01/20/2011, Dr. Truett Perna discussed switching from [facility] to a different tyrosine kinase inhibitor due to the periorbital edema and tearing. She was not interested at the time. At today's visit she expresses interest in switching medications. She will continue Gleevec for now and we will ask the managed care department to look into obtaining Tasigna for her. She will return for a lab visit in 6 weeks and a followup visit in 12 weeks. She will contact the office in the interim with any problems.  Plan reviewed with Dr. Truett Perna. Lonna Cobb ANP/GNP-BC

## 2011-04-22 LAB — BCR/ABL (LIO MMD)

## 2011-05-02 ENCOUNTER — Other Ambulatory Visit: Payer: Self-pay | Admitting: Nurse Practitioner

## 2011-05-02 ENCOUNTER — Encounter: Payer: Self-pay | Admitting: *Deleted

## 2011-05-02 NOTE — Progress Notes (Signed)
RECEIVED A FAX FROM [employer] CONCERNING A CONFIRMATION OF PRESCRIPTION SHIPMENT FOR GLEEVEC. 

## 2011-05-03 ENCOUNTER — Telehealth: Payer: Self-pay | Admitting: Oncology

## 2011-05-03 NOTE — Telephone Encounter (Signed)
S/w pt re appts for 4/23 and 6/4. Times per pt.

## 2011-05-09 ENCOUNTER — Telehealth: Payer: Self-pay | Admitting: *Deleted

## 2011-05-09 ENCOUNTER — Encounter: Payer: Self-pay | Admitting: Oncology

## 2011-05-09 DIAGNOSIS — C9210 Chronic myeloid leukemia, BCR/ABL-positive, not having achieved remission: Secondary | ICD-10-CM

## 2011-05-09 MED ORDER — NILOTINIB HCL 150 MG PO CAPS: 300.0000 mg | ORAL_CAPSULE | Freq: Two times a day (BID) | ORAL | Status: DC

## 2011-05-09 NOTE — Progress Notes (Signed)
[facility] [id number] Tasigna 150mg  has been approved from 05/09/11-05/08/12.

## 2011-05-09 NOTE — Telephone Encounter (Signed)
Message from [employer] that Tasigna 300 mg bid requires prior authorization. Need to call [facility] #[phone]. Forwarded to managed care dept.

## 2011-05-23 ENCOUNTER — Other Ambulatory Visit: Payer: Self-pay | Admitting: *Deleted

## 2011-05-23 NOTE — Telephone Encounter (Signed)
Call from [employer] that with her insurance and co pay assistance, the cost of Tasigna is zero co pay. Delivery has been scheduled for tomorrow.

## 2011-05-27 ENCOUNTER — Encounter: Payer: Self-pay | Admitting: *Deleted

## 2011-05-27 NOTE — Progress Notes (Signed)
RECEIVED A FAX FROM [employer] CONCERNING A CONFIRMATION OF PRESCRIPTION SHIPMENT FOR TASIGNA. 

## 2011-05-31 ENCOUNTER — Other Ambulatory Visit: Payer: Medicare Other | Admitting: Lab

## 2011-05-31 ENCOUNTER — Other Ambulatory Visit (HOSPITAL_BASED_OUTPATIENT_CLINIC_OR_DEPARTMENT_OTHER): Payer: Medicare Other | Admitting: Lab

## 2011-05-31 DIAGNOSIS — C9210 Chronic myeloid leukemia, BCR/ABL-positive, not having achieved remission: Secondary | ICD-10-CM

## 2011-05-31 LAB — CBC WITH DIFFERENTIAL/PLATELET
BASO%: 0.8 % (ref 0.0–2.0)
Basophils Absolute: 0 10*3/uL (ref 0.0–0.1)
EOS%: 6.7 % (ref 0.0–7.0)
Eosinophils Absolute: 0.3 10*3/uL (ref 0.0–0.5)
HCT: 33.9 % — ABNORMAL LOW (ref 34.8–46.6)
HGB: 11.8 g/dL (ref 11.6–15.9)
LYMPH%: 25.4 % (ref 14.0–49.7)
MCH: 34.3 pg — ABNORMAL HIGH (ref 25.1–34.0)
MCHC: 34.7 g/dL (ref 31.5–36.0)
MCV: 98.7 fL (ref 79.5–101.0)
MONO#: 0.5 10*3/uL (ref 0.1–0.9)
MONO%: 9.3 % (ref 0.0–14.0)
NEUT#: 3 10*3/uL (ref 1.5–6.5)
NEUT%: 57.8 % (ref 38.4–76.8)
Platelets: 191 10*3/uL (ref 145–400)
RBC: 3.44 10*6/uL — ABNORMAL LOW (ref 3.70–5.45)
RDW: 12.7 % (ref 11.2–14.5)
WBC: 5.1 10*3/uL (ref 3.9–10.3)
lymph#: 1.3 10*3/uL (ref 0.9–3.3)
nRBC: 0 % (ref 0–0)

## 2011-06-16 ENCOUNTER — Other Ambulatory Visit: Payer: Self-pay | Admitting: Family Medicine

## 2011-06-16 ENCOUNTER — Other Ambulatory Visit: Payer: Self-pay | Admitting: Advanced Practice Midwife

## 2011-06-16 DIAGNOSIS — Z1231 Encounter for screening mammogram for malignant neoplasm of breast: Secondary | ICD-10-CM

## 2011-06-27 ENCOUNTER — Other Ambulatory Visit: Payer: Self-pay | Admitting: *Deleted

## 2011-06-27 NOTE — Telephone Encounter (Signed)
THIS REQUEST WAS GIVEN TO DR.SHERRILL'S NURSE, SUSAN COWARD,RN. 

## 2011-07-01 ENCOUNTER — Encounter: Payer: Self-pay | Admitting: *Deleted

## 2011-07-01 NOTE — Progress Notes (Signed)
Fax confirmation from [employer] that Tasigna was shipped 06/30/11

## 2011-07-12 ENCOUNTER — Other Ambulatory Visit: Payer: Medicare Other | Admitting: Lab

## 2011-07-12 ENCOUNTER — Ambulatory Visit (HOSPITAL_BASED_OUTPATIENT_CLINIC_OR_DEPARTMENT_OTHER): Payer: Medicare Other | Admitting: Oncology

## 2011-07-12 ENCOUNTER — Telehealth: Payer: Self-pay | Admitting: Oncology

## 2011-07-12 ENCOUNTER — Other Ambulatory Visit (HOSPITAL_BASED_OUTPATIENT_CLINIC_OR_DEPARTMENT_OTHER): Payer: Medicare Other | Admitting: Lab

## 2011-07-12 ENCOUNTER — Ambulatory Visit: Payer: Medicare Other | Admitting: Oncology

## 2011-07-12 VITALS — BP 122/61 | HR 51 | Temp 97.3°F | Ht 63.0 in | Wt 117.7 lb

## 2011-07-12 DIAGNOSIS — R197 Diarrhea, unspecified: Secondary | ICD-10-CM

## 2011-07-12 DIAGNOSIS — C9210 Chronic myeloid leukemia, BCR/ABL-positive, not having achieved remission: Secondary | ICD-10-CM

## 2011-07-12 LAB — CBC WITH DIFFERENTIAL/PLATELET
BASO%: 1.3 % (ref 0.0–2.0)
Basophils Absolute: 0.1 10*3/uL (ref 0.0–0.1)
EOS%: 6.6 % (ref 0.0–7.0)
Eosinophils Absolute: 0.5 10*3/uL (ref 0.0–0.5)
HCT: 35.2 % (ref 34.8–46.6)
HGB: 11.9 g/dL (ref 11.6–15.9)
LYMPH%: 23.4 % (ref 14.0–49.7)
MCH: 32.2 pg (ref 25.1–34.0)
MCHC: 33.7 g/dL (ref 31.5–36.0)
MCV: 95.4 fL (ref 79.5–101.0)
MONO#: 0.8 10*3/uL (ref 0.1–0.9)
MONO%: 11.7 % (ref 0.0–14.0)
NEUT#: 4 10*3/uL (ref 1.5–6.5)
NEUT%: 57 % (ref 38.4–76.8)
Platelets: 226 10*3/uL (ref 145–400)
RBC: 3.69 10*6/uL — ABNORMAL LOW (ref 3.70–5.45)
RDW: 12.3 % (ref 11.2–14.5)
WBC: 7 10*3/uL (ref 3.9–10.3)
lymph#: 1.6 10*3/uL (ref 0.9–3.3)
nRBC: 0 % (ref 0–0)

## 2011-07-12 LAB — BASIC METABOLIC PANEL
BUN: 20 mg/dL (ref 6–23)
CO2: 24 mEq/L (ref 19–32)
Calcium: 8.8 mg/dL (ref 8.4–10.5)
Chloride: 109 mEq/L (ref 96–112)
Creatinine, Ser: 1.1 mg/dL (ref 0.50–1.10)
Glucose, Bld: 96 mg/dL (ref 70–99)
Potassium: 4.1 mEq/L (ref 3.5–5.3)
Sodium: 141 mEq/L (ref 135–145)

## 2011-07-12 NOTE — Progress Notes (Signed)
   [facility]    OFFICE PROGRESS NOTE   INTERVAL HISTORY:   She returns as scheduled. She began to [place] on may second 2013. She has noted improvement in the eye swelling/tearing, nausea, and diarrhea. She reports persistent malaise. She noted a "itchy "over the scalp after starting the current medication. She has also noted a mild rash over the legs.  Objective:  Vital signs in last 24 hours:  Blood pressure 122/61, pulse 51, temperature 97.3 F (36.3 C), temperature source Oral, height 5\' 3"  (1.6 m), weight 117 lb 11.2 oz (53.388 kg).    HEENT: No thrush or ulcers, minimal periorbital edema Resp: Lungs clear bilaterally Cardio: Regular rate and rhythm with premature beats GI: No hepatosplenomegaly Vascular: No leg  Skin: Mild fine erythematous rash over the trunk and thighs   Lab Results:  Lab Results  Component Value Date   WBC 7.0 07/12/2011   HGB 11.9 07/12/2011   HCT 35.2 07/12/2011   MCV 95.4 07/12/2011   PLT 226 07/12/2011   ANC 4.0    Medications: I have reviewed the patient's current medications.  Assessment/Plan: 1. Chronic myelogenous leukemia -- initially treated with hydroxyurea. She began Gleevec on 08/27/2009. Gleevec was placed on hold on 10/06/2009 due to poor tolerance of the 400 mg daily dose. Gleevec was restarted when she had hematologic improvement on 10/13/2009 and the dose was changed to 300 mg on 10/26/2009. The Gleevec dose was reduced to 200 mg daily due to excessive "tearing". The peripheral blood BCR/ABL returned at 16% on 04/19/2010. Gleevec was discontinued secondary to toxicity and a persistent elevation of the peripheral blood translocation .               -Nilotinib was started 06/09/2011 2. Diarrhea -- improved since stopping Gleevec 3. Periorbital edema -improved since stopping Gleevec 4. Increased "tearing"-improved since stopping Gleevec 5. Fine papular rash over the trunk and legs-likely secondary to the  nilotinib  Disposition:  She remains in hematologic remission from the CML. She appears to be tolerating the nilotinib well. She will return for an office visit and peripheral blood PCR in one month.   Thornton Papas, MD  07/12/2011  6:02 PM

## 2011-07-12 NOTE — Telephone Encounter (Signed)
Pt appts made and printed for pt,made for 7/8 as lt is out 7/3-

## 2011-07-14 ENCOUNTER — Ambulatory Visit
Admission: RE | Admit: 2011-07-14 | Discharge: 2011-07-14 | Disposition: A | Discharge status: Home / Self Care | Payer: Medicare Other | Source: Ambulatory Visit | Attending: Family Medicine | Admitting: Family Medicine

## 2011-07-14 DIAGNOSIS — Z1231 Encounter for screening mammogram for malignant neoplasm of breast: Secondary | ICD-10-CM

## 2011-08-02 ENCOUNTER — Other Ambulatory Visit: Payer: Self-pay | Admitting: *Deleted

## 2011-08-02 DIAGNOSIS — C9210 Chronic myeloid leukemia, BCR/ABL-positive, not having achieved remission: Secondary | ICD-10-CM

## 2011-08-02 MED ORDER — NILOTINIB HCL 150 MG PO CAPS: 300.0000 mg | ORAL_CAPSULE | Freq: Two times a day (BID) | ORAL | Status: DC

## 2011-08-02 NOTE — Telephone Encounter (Signed)
Tasigna refilled by collaborative nurse. Triage received faxed confirmation of prescription shipment.  Tasigna was shipped 08-01-2011 with next business day delivery.

## 2011-08-03 ENCOUNTER — Other Ambulatory Visit: Payer: Self-pay | Admitting: *Deleted

## 2011-08-03 ENCOUNTER — Telehealth: Payer: Self-pay | Admitting: Oncology

## 2011-08-03 ENCOUNTER — Telehealth: Payer: Self-pay | Admitting: *Deleted

## 2011-08-03 DIAGNOSIS — C9210 Chronic myeloid leukemia, BCR/ABL-positive, not having achieved remission: Secondary | ICD-10-CM

## 2011-08-03 NOTE — Telephone Encounter (Signed)
Per Dr. Truett Perna: May have pleural effusion? Needs CXR and EKG and CBC. See midlevel on 6/27. Order to scheduler.

## 2011-08-03 NOTE — Telephone Encounter (Signed)
Asking for appointment to see MD soon--reports significant increase in fatigue since her visit on 07/12/11. Also has developed a persistent cough and having some shortness of breath on exertion. She denies any chest pain or swelling and she is not a smoker. On Tasigna since 06/09/11

## 2011-08-03 NOTE — Telephone Encounter (Signed)
Gv pt appt for 06/27 along with cxr @ [facility]

## 2011-08-04 ENCOUNTER — Telehealth: Payer: Self-pay | Admitting: Oncology

## 2011-08-04 ENCOUNTER — Telehealth: Payer: Self-pay | Admitting: *Deleted

## 2011-08-04 ENCOUNTER — Other Ambulatory Visit (HOSPITAL_BASED_OUTPATIENT_CLINIC_OR_DEPARTMENT_OTHER): Payer: Medicare Other | Admitting: Lab

## 2011-08-04 ENCOUNTER — Ambulatory Visit (HOSPITAL_BASED_OUTPATIENT_CLINIC_OR_DEPARTMENT_OTHER): Payer: Medicare Other | Admitting: Nurse Practitioner

## 2011-08-04 ENCOUNTER — Ambulatory Visit (HOSPITAL_COMMUNITY)
Admission: RE | Admit: 2011-08-04 | Discharge: 2011-08-04 | Disposition: A | Discharge status: Home / Self Care | Payer: Medicare Other | Source: Ambulatory Visit | Attending: Oncology | Admitting: Oncology

## 2011-08-04 ENCOUNTER — Other Ambulatory Visit: Payer: Self-pay | Admitting: Nurse Practitioner

## 2011-08-04 ENCOUNTER — Other Ambulatory Visit: Payer: Self-pay

## 2011-08-04 VITALS — BP 141/79 | HR 79 | Temp 97.7°F | Ht 63.0 in | Wt 115.0 lb

## 2011-08-04 DIAGNOSIS — C9210 Chronic myeloid leukemia, BCR/ABL-positive, not having achieved remission: Secondary | ICD-10-CM

## 2011-08-04 DIAGNOSIS — R059 Cough, unspecified: Secondary | ICD-10-CM | POA: Insufficient documentation

## 2011-08-04 DIAGNOSIS — R21 Rash and other nonspecific skin eruption: Secondary | ICD-10-CM

## 2011-08-04 DIAGNOSIS — R0609 Other forms of dyspnea: Secondary | ICD-10-CM

## 2011-08-04 DIAGNOSIS — I4581 Long QT syndrome: Secondary | ICD-10-CM

## 2011-08-04 DIAGNOSIS — R0602 Shortness of breath: Secondary | ICD-10-CM | POA: Insufficient documentation

## 2011-08-04 DIAGNOSIS — R05 Cough: Secondary | ICD-10-CM | POA: Insufficient documentation

## 2011-08-04 DIAGNOSIS — R0989 Other specified symptoms and signs involving the circulatory and respiratory systems: Secondary | ICD-10-CM

## 2011-08-04 LAB — CBC WITH DIFFERENTIAL/PLATELET
BASO%: 1.3 % (ref 0.0–2.0)
Basophils Absolute: 0.1 10*3/uL (ref 0.0–0.1)
EOS%: 6.7 % (ref 0.0–7.0)
Eosinophils Absolute: 0.5 10*3/uL (ref 0.0–0.5)
HCT: 38.9 % (ref 34.8–46.6)
HGB: 13.1 g/dL (ref 11.6–15.9)
LYMPH%: 18.5 % (ref 14.0–49.7)
MCH: 31.3 pg (ref 25.1–34.0)
MCHC: 33.6 g/dL (ref 31.5–36.0)
MCV: 93.2 fL (ref 79.5–101.0)
MONO#: 1 10*3/uL — ABNORMAL HIGH (ref 0.1–0.9)
MONO%: 11.9 % (ref 0.0–14.0)
NEUT#: 4.9 10*3/uL (ref 1.5–6.5)
NEUT%: 61.6 % (ref 38.4–76.8)
Platelets: 241 10*3/uL (ref 145–400)
RBC: 4.17 10*6/uL (ref 3.70–5.45)
RDW: 12.2 % (ref 11.2–14.5)
WBC: 8 10*3/uL (ref 3.9–10.3)
lymph#: 1.5 10*3/uL (ref 0.9–3.3)

## 2011-08-04 LAB — COMPREHENSIVE METABOLIC PANEL
ALT: 41 U/L — ABNORMAL HIGH (ref 0–35)
AST: 27 U/L (ref 0–37)
Albumin: 4.2 g/dL (ref 3.5–5.2)
Alkaline Phosphatase: 55 U/L (ref 39–117)
BUN: 21 mg/dL (ref 6–23)
CO2: 25 mEq/L (ref 19–32)
Calcium: 9.1 mg/dL (ref 8.4–10.5)
Chloride: 106 mEq/L (ref 96–112)
Creatinine, Ser: 1.27 mg/dL — ABNORMAL HIGH (ref 0.50–1.10)
Glucose, Bld: 104 mg/dL — ABNORMAL HIGH (ref 70–99)
Potassium: 3.4 mEq/L — ABNORMAL LOW (ref 3.5–5.3)
Sodium: 141 mEq/L (ref 135–145)
Total Bilirubin: 1.1 mg/dL (ref 0.3–1.2)
Total Protein: 6.4 g/dL (ref 6.0–8.3)

## 2011-08-04 LAB — MAGNESIUM: Magnesium: 1.9 mg/dL (ref 1.5–2.5)

## 2011-08-04 MED ORDER — POTASSIUM CHLORIDE CRYS ER 20 MEQ PO TBCR: 20.0000 meq | EXTENDED_RELEASE_TABLET | Freq: Every day | ORAL | Status: DC

## 2011-08-04 NOTE — Telephone Encounter (Signed)
K+ level low--push fluids and start ktab 20 meq daily. Stop the OTC K+ supplement (not effective). Recheck on next visit.

## 2011-08-04 NOTE — Progress Notes (Signed)
OFFICE PROGRESS NOTE  Interval history:  [name] is a 78 year old woman with CML. She began Nilotinib on 06/09/2011. She is seen today prior to scheduled followup due to increased fatigue.  [name] reports progressive fatigue over the past one month. She tires easily. She also notes mild dyspnea on exertion. Over the past one week she has had a mild cough. She denies fever. No chest pain. She has occasional nausea. No vomiting. No diarrhea. She has a stable skin rash mainly located on the legs. Her appetite is good.   Objective: Blood pressure 141/79, pulse 79, temperature 97.7 F (36.5 C), temperature source Oral, height 5\' 3"  (1.6 m), weight 115 lb (52.164 kg). oxygen saturation 97-99% on room air.  Oropharynx is without thrush or ulceration. Lungs are clear. No wheezes or rales. Irregular cardiac rhythm. Mild bradycardia. Neck veins mildly distended. Abdomen soft and nontender. No organomegaly. Extremities are without edema. Mild fine erythematous rash over the thighs.  Lab Results: Lab Results  Component Value Date   WBC 8.0 08/04/2011   HGB 13.1 08/04/2011   HCT 38.9 08/04/2011   MCV 93.2 08/04/2011   PLT 241 08/04/2011    Chemistry:    Chemistry      Component Value Date/Time   NA 141 07/12/2011 1104   K 4.1 07/12/2011 1104   CL 109 07/12/2011 1104   CO2 24 07/12/2011 1104   BUN 20 07/12/2011 1104   CREATININE 1.10 07/12/2011 1104      Component Value Date/Time   CALCIUM 8.8 07/12/2011 1104   ALKPHOS 39 04/19/2011 1342   AST 15 04/19/2011 1342   ALT 13 04/19/2011 1342   BILITOT 0.5 04/19/2011 1342       Studies/Results: Dg Chest 2 View  08/04/2011  *RADIOLOGY REPORT*  Clinical Data: Cough and shortness of breath.  CHEST - 2 VIEW  Comparison: 11/05/2007.  Findings: Question mild upper tracheal narrowing.  Heart size normal.  Thoracic aorta is calcified.  Lungs are clear.  No pleural fluid.  Pectus deformity.  IMPRESSION:  1.  No acute findings. 2.  Upper trachea appears narrowed,  which can be seen with thyroid enlargement.  Please correlate clinically.  Original Report Authenticated By: Reyes Ivan, M.D.   Mm Digital Screening  07/15/2011  *RADIOLOGY REPORT*  Clinical Data: Screening.  MAMMOGRAPHIC BILATERAL DIGITAL SCREENING WITH CAD  Findings: There are scattered fibroglandular densities.  No masses or malignant type calcifications are identified.  Compared with prior studies.  Images were processed with CAD.  IMPRESSION: No specific mammographic evidence of malignancy.  A result letter of this screening mammogram will be mailed directly to the patient.  RECOMMENDATION: Screening mammogram in one year. (Code:SM-B-01Y)  BI-RADS CATEGORY 1:  Negative  Original Report Authenticated By: Hiram Gash, M.D.    Medications: I have reviewed the patient's current medications.  Assessment/Plan:  1. Chronic myelogenous leukemia -- initially treated with hydroxyurea. She began Gleevec on 08/27/2009. Gleevec was placed on hold on 10/06/2009 due to poor tolerance of the 400 mg daily dose. Gleevec was restarted when she had hematologic improvement on 10/13/2009 and the dose was changed to 300 mg on 10/26/2009. The Gleevec dose was reduced to 200 mg daily due to excessive "tearing". The peripheral blood BCR/ABL returned at 16% on 04/19/2010. Gleevec was discontinued secondary to toxicity and a persistent elevation of the peripheral blood translocation . She began Nilotinib on 06/09/2011. 2. Diarrhea. Resolved since discontinuing Gleevec. 3. Periorbital edema -improved since stopping Gleevec. 4. Increased "  tearing"-improved since stopping Gleevec. 5. Fine papular rash over the trunk and legs-likely secondary to the nilotinib. 6. Progressive fatigue/mild dyspnea on exertion. Question etiology. 7. Mild QT prolongation and multifocal atrial pacemaker on EKG today.   Disposition-the etiology of the fatigue and dyspnea on exertion is unclear. Dr. Truett Perna discussed her case with  Dr. Elease Hashimoto, cardiology. Dr. Elease Hashimoto reviewed the EKG done today with findings of a multifocal atrial pacemaker and slight QT prolongation. He recommended that she hold Tenoretic. Further cardiology evaluation not felt to be necessary at this time. We placed the Nilotinib on hold and are checking potassium and magnesium levels. She will return for a repeat EKG and followup visit on 08/15/2011. She will contact the office in the interim with worsening of her current symptoms or any new symptoms.  Patient seen with Dr. Truett Perna.  Lonna Cobb ANP/GNP-BC

## 2011-08-04 NOTE — Telephone Encounter (Signed)
Poke with Dr Nahser/ DOD regarding ekg's, dr able to see in system. No further orders

## 2011-08-04 NOTE — Telephone Encounter (Signed)
gv pt appt schedule for july °

## 2011-08-15 ENCOUNTER — Ambulatory Visit (HOSPITAL_BASED_OUTPATIENT_CLINIC_OR_DEPARTMENT_OTHER): Payer: Medicare Other | Admitting: Nurse Practitioner

## 2011-08-15 ENCOUNTER — Other Ambulatory Visit (HOSPITAL_BASED_OUTPATIENT_CLINIC_OR_DEPARTMENT_OTHER): Payer: Medicare Other | Admitting: Lab

## 2011-08-15 ENCOUNTER — Other Ambulatory Visit: Payer: Self-pay

## 2011-08-15 ENCOUNTER — Telehealth: Payer: Self-pay | Admitting: Oncology

## 2011-08-15 VITALS — BP 141/82 | HR 86 | Temp 99.1°F | Ht 63.0 in | Wt 115.3 lb

## 2011-08-15 DIAGNOSIS — C9210 Chronic myeloid leukemia, BCR/ABL-positive, not having achieved remission: Secondary | ICD-10-CM

## 2011-08-15 LAB — BASIC METABOLIC PANEL
BUN: 19 mg/dL (ref 6–23)
CO2: 26 mEq/L (ref 19–32)
Calcium: 9 mg/dL (ref 8.4–10.5)
Chloride: 109 mEq/L (ref 96–112)
Creatinine, Ser: 0.85 mg/dL (ref 0.50–1.10)
Glucose, Bld: 97 mg/dL (ref 70–99)
Potassium: 4.4 mEq/L (ref 3.5–5.3)
Sodium: 140 mEq/L (ref 135–145)

## 2011-08-15 LAB — CBC WITH DIFFERENTIAL/PLATELET
BASO%: 1.3 % (ref 0.0–2.0)
Basophils Absolute: 0.1 10*3/uL (ref 0.0–0.1)
EOS%: 6.1 % (ref 0.0–7.0)
Eosinophils Absolute: 0.4 10*3/uL (ref 0.0–0.5)
HCT: 39.2 % (ref 34.8–46.6)
HGB: 13.1 g/dL (ref 11.6–15.9)
LYMPH%: 27.1 % (ref 14.0–49.7)
MCH: 30.8 pg (ref 25.1–34.0)
MCHC: 33.5 g/dL (ref 31.5–36.0)
MCV: 91.9 fL (ref 79.5–101.0)
MONO#: 0.7 10*3/uL (ref 0.1–0.9)
MONO%: 10.8 % (ref 0.0–14.0)
NEUT#: 3.5 10*3/uL (ref 1.5–6.5)
NEUT%: 54.7 % (ref 38.4–76.8)
Platelets: 236 10*3/uL (ref 145–400)
RBC: 4.27 10*6/uL (ref 3.70–5.45)
RDW: 12.3 % (ref 11.2–14.5)
WBC: 6.3 10*3/uL (ref 3.9–10.3)
lymph#: 1.7 10*3/uL (ref 0.9–3.3)

## 2011-08-15 NOTE — Progress Notes (Signed)
OFFICE PROGRESS NOTE  Interval history:  [name] returns as scheduled. She is feeling better. Her energy level has improved. She denies dyspnea on exertion. She continues to have a slight cough which she attributes to a "head cold". Skin rash is better.   Objective: Blood pressure 141/82, pulse 86, temperature 99.1 F (37.3 C), temperature source Oral, height 5\' 3"  (1.6 m), weight 115 lb 4.8 oz (52.3 kg).  Oropharynx is without thrush or ulceration. Lungs are clear. Regular cardiac rhythm. Abdomen is soft and nontender. No organomegaly. Extremities are without edema. Calves are soft and nontender.  Lab Results: Lab Results  Component Value Date   WBC 6.3 08/15/2011   HGB 13.1 08/15/2011   HCT 39.2 08/15/2011   MCV 91.9 08/15/2011   PLT 236 08/15/2011    Chemistry:    Chemistry      Component Value Date/Time   NA 141 08/04/2011 1302   K 3.4* 08/04/2011 1302   CL 106 08/04/2011 1302   CO2 25 08/04/2011 1302   BUN 21 08/04/2011 1302   CREATININE 1.27* 08/04/2011 1302      Component Value Date/Time   CALCIUM 9.1 08/04/2011 1302   ALKPHOS 55 08/04/2011 1302   AST 27 08/04/2011 1302   ALT 41* 08/04/2011 1302   BILITOT 1.1 08/04/2011 1302       Studies/Results: Dg Chest 2 View  08/04/2011  *RADIOLOGY REPORT*  Clinical Data: Cough and shortness of breath.  CHEST - 2 VIEW  Comparison: 11/05/2007.  Findings: Question mild upper tracheal narrowing.  Heart size normal.  Thoracic aorta is calcified.  Lungs are clear.  No pleural fluid.  Pectus deformity.  IMPRESSION:  1.  No acute findings. 2.  Upper trachea appears narrowed, which can be seen with thyroid enlargement.  Please correlate clinically.  Original Report Authenticated By: Reyes Ivan, M.D.    Medications: I have reviewed the patient's current medications.  Assessment/Plan:  1. Chronic myelogenous leukemia -- initially treated with hydroxyurea. She began Gleevec on 08/27/2009. Gleevec was placed on hold on 10/06/2009 due to poor  tolerance of the 400 mg daily dose. Gleevec was restarted when she had hematologic improvement on 10/13/2009 and the dose was changed to 300 mg on 10/26/2009. The Gleevec dose was reduced to 200 mg daily due to excessive "tearing". The peripheral blood BCR/ABL returned at 16% on 04/19/2010. Gleevec was discontinued secondary to toxicity and a persistent elevation of the peripheral blood translocation . She began Nilotinib on 06/09/2011. Nilotinib placed on hold beginning 08/04/2011 due to fatigue/dyspnea on exertion and  slight QT prolongation. 2. Diarrhea. Resolved since discontinuing Gleevec. 3. Periorbital edema -improved since stopping Gleevec. 4. Increased "tearing"-improved since stopping Gleevec. 5. Fine papular rash over the trunk and legs-likely secondary to the nilotinib. Improved. 6. Progressive fatigue/mild dyspnea on exertion. Question etiology. Improved. 7. Mild QT prolongation and multifocal atrial pacemaker on EKG 08/04/2011. QT prolongation improved and PVCs on EKG today. 8. Mild hypokalemia 08/04/2011. Oral replacement initiated.  Disposition-[name] appears improved. EKG appears improved as well. Dr. Truett Perna recommends resuming the Nilotinib at a dose of 300 mg daily (previous dose 300 mg twice daily). We will followup on the outstanding labs from today. She will return for a followup visit in 3 weeks. She will contact the office in the interim with any problems.  Plan per Dr. Truett Perna.  [name] ANP/GNP-BC

## 2011-08-15 NOTE — Telephone Encounter (Signed)
appts made and printed for pt aom °

## 2011-09-02 ENCOUNTER — Other Ambulatory Visit: Payer: Self-pay

## 2011-09-02 ENCOUNTER — Ambulatory Visit (HOSPITAL_BASED_OUTPATIENT_CLINIC_OR_DEPARTMENT_OTHER): Payer: Medicare Other | Admitting: Oncology

## 2011-09-02 ENCOUNTER — Telehealth: Payer: Self-pay | Admitting: *Deleted

## 2011-09-02 ENCOUNTER — Other Ambulatory Visit (HOSPITAL_BASED_OUTPATIENT_CLINIC_OR_DEPARTMENT_OTHER): Payer: Medicare Other | Admitting: Lab

## 2011-09-02 VITALS — BP 150/78 | HR 86 | Temp 97.6°F | Ht 63.0 in | Wt 115.0 lb

## 2011-09-02 DIAGNOSIS — C9210 Chronic myeloid leukemia, BCR/ABL-positive, not having achieved remission: Secondary | ICD-10-CM

## 2011-09-02 LAB — BASIC METABOLIC PANEL
BUN: 16 mg/dL (ref 6–23)
CO2: 25 mEq/L (ref 19–32)
Calcium: 8.9 mg/dL (ref 8.4–10.5)
Chloride: 109 mEq/L (ref 96–112)
Creatinine, Ser: 0.8 mg/dL (ref 0.50–1.10)
Glucose, Bld: 92 mg/dL (ref 70–99)
Potassium: 4.4 mEq/L (ref 3.5–5.3)
Sodium: 140 mEq/L (ref 135–145)

## 2011-09-02 LAB — CBC WITH DIFFERENTIAL/PLATELET
BASO%: 0.7 % (ref 0.0–2.0)
Basophils Absolute: 0 10*3/uL (ref 0.0–0.1)
EOS%: 5.7 % (ref 0.0–7.0)
Eosinophils Absolute: 0.3 10*3/uL (ref 0.0–0.5)
HCT: 36.6 % (ref 34.8–46.6)
HGB: 12.4 g/dL (ref 11.6–15.9)
LYMPH%: 26.5 % (ref 14.0–49.7)
MCH: 30.2 pg (ref 25.1–34.0)
MCHC: 33.8 g/dL (ref 31.5–36.0)
MCV: 89.5 fL (ref 79.5–101.0)
MONO#: 0.6 10*3/uL (ref 0.1–0.9)
MONO%: 11.7 % (ref 0.0–14.0)
NEUT#: 2.8 10*3/uL (ref 1.5–6.5)
NEUT%: 55.4 % (ref 38.4–76.8)
Platelets: 183 10*3/uL (ref 145–400)
RBC: 4.09 10*6/uL (ref 3.70–5.45)
RDW: 12.2 % (ref 11.2–14.5)
WBC: 5 10*3/uL (ref 3.9–10.3)
lymph#: 1.3 10*3/uL (ref 0.9–3.3)

## 2011-09-02 NOTE — Progress Notes (Signed)
   [facility]    OFFICE PROGRESS NOTE   INTERVAL HISTORY:   She returns as scheduled. She is tolerating the nilotinib well. No swelling or diarrhea. Her energy level has improved.  Objective:  Vital signs in last 24 hours:  Blood pressure 150/78, pulse 86, temperature 97.6 F (36.4 C), temperature source Oral, height 5\' 3"  (1.6 m), weight 115 lb (52.164 kg).    HEENT: No thrush or ulcer Resp: Lungs clear bilaterally Cardio: Regular rate and rhythm with occasional premature beats GI: No hepatosplenomegaly Vascular: No leg edema  Skin: Mild dry fine rash at the upper back     Lab Results:  Lab Results  Component Value Date   WBC 5.0 09/02/2011   HGB 12.4 09/02/2011   HCT 36.6 09/02/2011   MCV 89.5 09/02/2011   PLT 183 09/02/2011    ANC 2.8 EKG: Sinus rhythm with premature ventricular complexes, QT 416, QTC 461 potassium 4.4 on 08/15/2011   Medications: I have reviewed the patient's current medications.  Assessment/Plan: 1. Chronic myelogenous leukemia -- initially treated with hydroxyurea. She began Gleevec on 08/27/2009. Gleevec was placed on hold on 10/06/2009 due to poor tolerance of the 400 mg daily dose. Gleevec was restarted when she had hematologic improvement on 10/13/2009 and the dose was changed to 300 mg on 10/26/2009. The Gleevec dose was reduced to 200 mg daily due to excessive "tearing". The peripheral blood BCR/ABL returned at 16% on 04/19/2010. Gleevec was discontinued secondary to toxicity and a persistent elevation of the peripheral blood translocation . She began Nilotinib on 06/09/2011. Nilotinib placed on hold beginning 08/04/2011 due to fatigue/dyspnea on exertion and slight QT prolongation. Nilotinib was resumed on 08/15/2011 at a dose of 300 mg daily 2. Diarrhea. Resolved since discontinuing Gleevec. 3. Periorbital edema -improved since stopping Gleevec. 4. Increased "tearing"-improved since stopping Gleevec. 5. Fine papular rash over  the trunk and legs-likely secondary to the nilotinib. Improved. 6. Progressive fatigue/mild dyspnea on exertion. Question etiology. Improved. 7. Mild QT prolongation and multifocal atrial pacemaker on EKG 08/04/2011. QT is normal and normal sinus rhythm on the EKG today. 8. Mild hypokalemia 08/04/2011. Oral replacement initiated. The potassium was normal on 08/15/2011.   Disposition:   She appears to be tolerating the Nilotinib well. She will continue the 300 mg daily dose. Ms. Alyse Low will return for an office visit in one month. The peripheral blood PCR will be checked next month. She will resume Tenoretic.    Thornton Papas, MD  09/02/2011  11:30 AM

## 2011-09-02 NOTE — Telephone Encounter (Signed)
Gave patient appointment for 10-04-2011 starting at 11:15am with the lab printed out calendar and gave to the patient

## 2011-09-20 ENCOUNTER — Encounter: Payer: Self-pay | Admitting: Internal Medicine

## 2011-10-04 ENCOUNTER — Ambulatory Visit (HOSPITAL_BASED_OUTPATIENT_CLINIC_OR_DEPARTMENT_OTHER): Payer: Medicare Other | Admitting: Nurse Practitioner

## 2011-10-04 ENCOUNTER — Other Ambulatory Visit: Payer: Self-pay

## 2011-10-04 ENCOUNTER — Ambulatory Visit: Payer: Medicare Other

## 2011-10-04 ENCOUNTER — Other Ambulatory Visit: Payer: Medicare Other | Admitting: Lab

## 2011-10-04 ENCOUNTER — Telehealth: Payer: Self-pay | Admitting: Oncology

## 2011-10-04 ENCOUNTER — Ambulatory Visit: Payer: Medicare Other | Admitting: Nurse Practitioner

## 2011-10-04 ENCOUNTER — Other Ambulatory Visit (HOSPITAL_BASED_OUTPATIENT_CLINIC_OR_DEPARTMENT_OTHER): Payer: Medicare Other | Admitting: Lab

## 2011-10-04 DIAGNOSIS — C9210 Chronic myeloid leukemia, BCR/ABL-positive, not having achieved remission: Secondary | ICD-10-CM

## 2011-10-04 LAB — CBC WITH DIFFERENTIAL/PLATELET
BASO%: 1 % (ref 0.0–2.0)
Basophils Absolute: 0.1 10*3/uL (ref 0.0–0.1)
EOS%: 5.9 % (ref 0.0–7.0)
Eosinophils Absolute: 0.4 10*3/uL (ref 0.0–0.5)
HCT: 38 % (ref 34.8–46.6)
HGB: 13.3 g/dL (ref 11.6–15.9)
LYMPH%: 27.3 % (ref 14.0–49.7)
MCH: 30.6 pg (ref 25.1–34.0)
MCHC: 35 g/dL (ref 31.5–36.0)
MCV: 87.4 fL (ref 79.5–101.0)
MONO#: 0.6 10*3/uL (ref 0.1–0.9)
MONO%: 9.9 % (ref 0.0–14.0)
NEUT#: 3.5 10*3/uL (ref 1.5–6.5)
NEUT%: 55.9 % (ref 38.4–76.8)
Platelets: 219 10*3/uL (ref 145–400)
RBC: 4.34 10*6/uL (ref 3.70–5.45)
RDW: 12.9 % (ref 11.2–14.5)
WBC: 6.3 10*3/uL (ref 3.9–10.3)
lymph#: 1.7 10*3/uL (ref 0.9–3.3)

## 2011-10-04 LAB — COMPREHENSIVE METABOLIC PANEL (CC13)
ALT: 16 U/L (ref 0–55)
AST: 16 U/L (ref 5–34)
Albumin: 3.8 g/dL (ref 3.5–5.0)
Alkaline Phosphatase: 36 U/L — ABNORMAL LOW (ref 40–150)
BUN: 21 mg/dL (ref 7.0–26.0)
CO2: 26 mEq/L (ref 22–29)
Calcium: 9.1 mg/dL (ref 8.4–10.4)
Chloride: 106 mEq/L (ref 98–107)
Creatinine: 1 mg/dL (ref 0.6–1.1)
Glucose: 97 mg/dl (ref 70–99)
Potassium: 3.5 mEq/L (ref 3.5–5.1)
Sodium: 140 mEq/L (ref 136–145)
Total Bilirubin: 1.1 mg/dL (ref 0.20–1.20)
Total Protein: 6.2 g/dL — ABNORMAL LOW (ref 6.4–8.3)

## 2011-10-04 LAB — MAGNESIUM (CC13): Magnesium: 2.4 mg/dl (ref 1.5–2.5)

## 2011-10-04 NOTE — Progress Notes (Signed)
OFFICE PROGRESS NOTE  Interval history:  [name] returns as scheduled. She is feeling well. No interim illnesses or infections. She denies nausea/vomiting. No mouth sores. No diarrhea. No skin rash.   Objective: There were no vitals taken for this visit.  Oropharynx is without thrush or ulceration. No palpable cervical or supraclavicular lymph nodes. Lungs are clear. Regular cardiac rhythm with occasional premature beat. Abdomen soft and nontender. No organomegaly. Extremities without edema. Calves soft and nontender.  Lab Results: Lab Results  Component Value Date   WBC 6.3 10/04/2011   HGB 13.3 10/04/2011   HCT 38.0 10/04/2011   MCV 87.4 10/04/2011   PLT 219 10/04/2011    Chemistry:    Chemistry      Component Value Date/Time   NA 140 10/04/2011 1121   NA 140 09/02/2011 0941   K 3.5 10/04/2011 1121   K 4.4 09/02/2011 0941   CL 106 10/04/2011 1121   CL 109 09/02/2011 0941   CO2 26 10/04/2011 1121   CO2 25 09/02/2011 0941   BUN 21.0 10/04/2011 1121   BUN 16 09/02/2011 0941   CREATININE 1.0 10/04/2011 1121   CREATININE 0.80 09/02/2011 0941      Component Value Date/Time   CALCIUM 9.1 10/04/2011 1121   CALCIUM 8.9 09/02/2011 0941   ALKPHOS 36* 10/04/2011 1121   ALKPHOS 55 08/04/2011 1302   AST 16 10/04/2011 1121   AST 27 08/04/2011 1302   ALT 16 10/04/2011 1121   ALT 41* 08/04/2011 1302   BILITOT 1.10 10/04/2011 1121   BILITOT 1.1 08/04/2011 1302       Studies/Results: No results found.  Medications: I have reviewed the patient's current medications.  Assessment/Plan:  1. Chronic myelogenous leukemia -- initially treated with hydroxyurea. She began Gleevec on 08/27/2009. Gleevec was placed on hold on 10/06/2009 due to poor tolerance of the 400 mg daily dose. Gleevec was restarted when she had hematologic improvement on 10/13/2009 and the dose was changed to 300 mg on 10/26/2009. The Gleevec dose was reduced to 200 mg daily due to excessive "tearing". The peripheral blood BCR/ABL  returned at 16% on 04/19/2010. Gleevec was discontinued secondary to toxicity and a persistent elevation of the peripheral blood translocation . She began Nilotinib on 06/09/2011. Nilotinib placed on hold beginning 08/04/2011 due to fatigue/dyspnea on exertion and slight QT prolongation. Nilotinib was resumed on 08/15/2011 at a dose of 300 mg daily. 2. Diarrhea. Resolved since discontinuing Gleevec. 3. Periorbital edema -improved since stopping Gleevec. 4. Increased "tearing"-improved since stopping Gleevec. 5. Fine papular rash over the trunk and legs-likely secondary to the nilotinib. She denied a rash at today's visit. 6. Progressive fatigue/mild dyspnea on exertion. Question etiology. Resolved. 7. Mild QT prolongation and multifocal atrial pacemaker on EKG 08/04/2011. QT normal on EKG today. 8. Mild hypokalemia 08/04/2011. Oral replacement initiated. The potassium was normal on today's labs.  Disposition-Ms. Westergard appears stable. She will continue Nilotinib 300 mg daily. We will followup on the peripheral blood PCR from today. She will return for a followup visit in 6 weeks. She will contact the office in the interim with any problems.  Plan reviewed with Dr. Truett Perna.  Lonna Cobb ANP/GNP-BC

## 2011-10-04 NOTE — Telephone Encounter (Signed)
gv pt appt schedule for October.  °

## 2011-10-11 ENCOUNTER — Encounter: Payer: Self-pay | Admitting: *Deleted

## 2011-10-11 NOTE — Progress Notes (Signed)
RECEIVED A FAX FROM [facility] CONCERNING A CONFIRMATION OF PRESCRIPTION SHIPMENT FOR TASIGNA ON 10/07/11

## 2011-11-02 ENCOUNTER — Other Ambulatory Visit: Payer: Self-pay | Admitting: *Deleted

## 2011-11-02 DIAGNOSIS — C9210 Chronic myeloid leukemia, BCR/ABL-positive, not having achieved remission: Secondary | ICD-10-CM

## 2011-11-02 MED ORDER — NILOTINIB HCL 150 MG PO CAPS: 300.0000 mg | ORAL_CAPSULE | Freq: Every day | ORAL | Status: DC

## 2011-11-14 ENCOUNTER — Other Ambulatory Visit: Payer: Self-pay | Admitting: *Deleted

## 2011-11-14 DIAGNOSIS — C9210 Chronic myeloid leukemia, BCR/ABL-positive, not having achieved remission: Secondary | ICD-10-CM

## 2011-11-14 MED ORDER — NILOTINIB HCL 150 MG PO CAPS: 300.0000 mg | ORAL_CAPSULE | Freq: Every day | ORAL | Status: DC

## 2011-11-15 ENCOUNTER — Other Ambulatory Visit: Payer: Self-pay

## 2011-11-15 ENCOUNTER — Ambulatory Visit (HOSPITAL_BASED_OUTPATIENT_CLINIC_OR_DEPARTMENT_OTHER): Payer: Medicare Other | Admitting: Oncology

## 2011-11-15 ENCOUNTER — Telehealth: Payer: Self-pay | Admitting: Oncology

## 2011-11-15 ENCOUNTER — Other Ambulatory Visit (HOSPITAL_BASED_OUTPATIENT_CLINIC_OR_DEPARTMENT_OTHER): Payer: Medicare Other | Admitting: Lab

## 2011-11-15 VITALS — BP 144/84 | HR 57 | Temp 97.4°F | Resp 20 | Ht 63.0 in | Wt 119.6 lb

## 2011-11-15 DIAGNOSIS — C9210 Chronic myeloid leukemia, BCR/ABL-positive, not having achieved remission: Secondary | ICD-10-CM

## 2011-11-15 LAB — CBC WITH DIFFERENTIAL/PLATELET
BASO%: 1 % (ref 0.0–2.0)
Basophils Absolute: 0.1 10*3/uL (ref 0.0–0.1)
EOS%: 5.6 % (ref 0.0–7.0)
Eosinophils Absolute: 0.4 10*3/uL (ref 0.0–0.5)
HCT: 40.6 % (ref 34.8–46.6)
HGB: 13.8 g/dL (ref 11.6–15.9)
LYMPH%: 29.2 % (ref 14.0–49.7)
MCH: 29.8 pg (ref 25.1–34.0)
MCHC: 34 g/dL (ref 31.5–36.0)
MCV: 87.7 fL (ref 79.5–101.0)
MONO#: 0.7 10*3/uL (ref 0.1–0.9)
MONO%: 10 % (ref 0.0–14.0)
NEUT#: 3.7 10*3/uL (ref 1.5–6.5)
NEUT%: 54.2 % (ref 38.4–76.8)
Platelets: 222 10*3/uL (ref 145–400)
RBC: 4.63 10*6/uL (ref 3.70–5.45)
RDW: 13.7 % (ref 11.2–14.5)
WBC: 6.8 10*3/uL (ref 3.9–10.3)
lymph#: 2 10*3/uL (ref 0.9–3.3)

## 2011-11-15 LAB — COMPREHENSIVE METABOLIC PANEL (CC13)
ALT: 14 U/L (ref 0–55)
AST: 15 U/L (ref 5–34)
Albumin: 4 g/dL (ref 3.5–5.0)
Alkaline Phosphatase: 42 U/L (ref 40–150)
BUN: 17 mg/dL (ref 7.0–26.0)
CO2: 24 mEq/L (ref 22–29)
Calcium: 9.5 mg/dL (ref 8.4–10.4)
Chloride: 106 mEq/L (ref 98–107)
Creatinine: 1.1 mg/dL (ref 0.6–1.1)
Glucose: 93 mg/dl (ref 70–99)
Potassium: 3.7 mEq/L (ref 3.5–5.1)
Sodium: 141 mEq/L (ref 136–145)
Total Bilirubin: 0.9 mg/dL (ref 0.20–1.20)
Total Protein: 6.9 g/dL (ref 6.4–8.3)

## 2011-11-15 NOTE — Progress Notes (Signed)
   [facility]    OFFICE PROGRESS NOTE   INTERVAL HISTORY:   She returns as scheduled. No new complaint. She has rhinorrhea and increased tearing. She relates this to seasonal allergies. No nausea or diarrhea.  Objective:  Vital signs in last 24 hours:  Blood pressure 144/84, pulse 57, temperature 97.4 F (36.3 C), temperature source Oral, resp. rate 20, height 5\' 3"  (1.6 m), weight 119 lb 9.6 oz (54.25 kg).    HEENT: No thrush or ulcers, mild periorbital edema Resp: Lungs clear bilaterally Cardio: Regular rate and rhythm GI: No hepatosplenomegaly Vascular: No leg edema  Skin: No rash     Lab Results:  Lab Results  Component Value Date   WBC 6.8 11/15/2011   HGB 13.8 11/15/2011   HCT 40.6 11/15/2011   MCV 87.7 11/15/2011   PLT 222 11/15/2011   ANC 3.7, potassium 3.7, creatinine 1.1 Peripheral blood BCR: ABL on 10/04/2011-0.48%  EKG-sinus bradycardia with occasional premature ventricular complexes., QT 468, QTC 447  Medications: I have reviewed the patient's current medications.  Assessment/Plan: 1. Chronic myelogenous leukemia -- initially treated with hydroxyurea. She began Gleevec on 08/27/2009. Gleevec was placed on hold on 10/06/2009 due to poor tolerance of the 400 mg daily dose. Gleevec was restarted when she had hematologic improvement on 10/13/2009 and the dose was changed to 300 mg on 10/26/2009. The Gleevec dose was reduced to 200 mg daily due to excessive "tearing". The peripheral blood BCR/ABL returned at 16% on 04/19/2010. Gleevec was discontinued secondary to toxicity and a persistent elevation of the peripheral blood translocation . She began Nilotinib on 06/09/2011. Nilotinib placed on hold beginning 08/04/2011 due to fatigue/dyspnea on exertion and slight QT prolongation. Nilotinib was resumed on 08/15/2011 at a dose of 300 mg daily. 2. Diarrhea. Resolved since discontinuing Gleevec. 3. Periorbital edema -improved since stopping  Gleevec. 4. Increased "tearing"-improved since stopping Gleevec. 5. History of a Fine papular rash over the trunk and legs-likely secondary to the nilotinib. No rash today. 6. Mild QT prolongation and multifocal atrial pacemaker on EKG 08/04/2011. QT normal on EKG today. 7. Mild hypokalemia 08/04/2011. Oral replacement initiated. The potassium was normal on today's labs.  Disposition:  She appears stable. She remains in hematologic remission and the peripheral blood PCR has improved. She will continue Nilotinib. Ms. Bertelli will return for an office visit, labs, and EKG in 6 weeks.   Thornton Papas, MD  11/15/2011  1:23 PM

## 2011-11-15 NOTE — Telephone Encounter (Signed)
Printed and gv pt appt schedule for NOV °

## 2011-11-30 ENCOUNTER — Telehealth: Payer: Self-pay | Admitting: *Deleted

## 2011-11-30 NOTE — Telephone Encounter (Signed)
[facility] faxed confirmation of prescription shipment.  Tasigna was shipped 11-29-2011 with next business day delivery. 

## 2011-12-27 ENCOUNTER — Telehealth: Payer: Self-pay | Admitting: Oncology

## 2011-12-27 ENCOUNTER — Other Ambulatory Visit: Payer: Self-pay

## 2011-12-27 ENCOUNTER — Ambulatory Visit (HOSPITAL_BASED_OUTPATIENT_CLINIC_OR_DEPARTMENT_OTHER): Payer: Medicare Other | Admitting: Nurse Practitioner

## 2011-12-27 ENCOUNTER — Other Ambulatory Visit (HOSPITAL_BASED_OUTPATIENT_CLINIC_OR_DEPARTMENT_OTHER): Payer: Medicare Other | Admitting: Lab

## 2011-12-27 ENCOUNTER — Encounter: Payer: Self-pay | Admitting: *Deleted

## 2011-12-27 DIAGNOSIS — C9211 Chronic myeloid leukemia, BCR/ABL-positive, in remission: Secondary | ICD-10-CM

## 2011-12-27 DIAGNOSIS — C9210 Chronic myeloid leukemia, BCR/ABL-positive, not having achieved remission: Secondary | ICD-10-CM

## 2011-12-27 LAB — CBC WITH DIFFERENTIAL/PLATELET
BASO%: 1 % (ref 0.0–2.0)
Basophils Absolute: 0.1 10*3/uL (ref 0.0–0.1)
EOS%: 5.2 % (ref 0.0–7.0)
Eosinophils Absolute: 0.4 10*3/uL (ref 0.0–0.5)
HCT: 37.9 % (ref 34.8–46.6)
HGB: 13.4 g/dL (ref 11.6–15.9)
LYMPH%: 26.7 % (ref 14.0–49.7)
MCH: 32.3 pg (ref 25.1–34.0)
MCHC: 35.3 g/dL (ref 31.5–36.0)
MCV: 91.3 fL (ref 79.5–101.0)
MONO#: 0.7 10*3/uL (ref 0.1–0.9)
MONO%: 9.9 % (ref 0.0–14.0)
NEUT#: 3.9 10*3/uL (ref 1.5–6.5)
NEUT%: 57.2 % (ref 38.4–76.8)
Platelets: 223 10*3/uL (ref 145–400)
RBC: 4.15 10*6/uL (ref 3.70–5.45)
RDW: 13.8 % (ref 11.2–14.5)
WBC: 6.7 10*3/uL (ref 3.9–10.3)
lymph#: 1.8 10*3/uL (ref 0.9–3.3)

## 2011-12-27 LAB — COMPREHENSIVE METABOLIC PANEL (CC13)
ALT: 14 U/L (ref 0–55)
AST: 16 U/L (ref 5–34)
Albumin: 4 g/dL (ref 3.5–5.0)
Alkaline Phosphatase: 42 U/L (ref 40–150)
BUN: 20 mg/dL (ref 7.0–26.0)
CO2: 28 mEq/L (ref 22–29)
Calcium: 10.1 mg/dL (ref 8.4–10.4)
Chloride: 105 mEq/L (ref 98–107)
Creatinine: 1 mg/dL (ref 0.6–1.1)
Glucose: 90 mg/dl (ref 70–99)
Potassium: 3.7 mEq/L (ref 3.5–5.1)
Sodium: 140 mEq/L (ref 136–145)
Total Bilirubin: 0.99 mg/dL (ref 0.20–1.20)
Total Protein: 6.9 g/dL (ref 6.4–8.3)

## 2011-12-27 NOTE — Progress Notes (Signed)
OFFICE PROGRESS NOTE  Interval history:   [name] returns as scheduled. She is feeling well. No interim illnesses or infections. She has occasional mild nausea. No mouth sores. No diarrhea. No skin rash. She continues to note rhinorrhea and increased tearing. She relates both to allergies.   Objective: 96.7, 71, 20, 149/79  Oropharynx is without thrush or ulceration. No palpable cervical or supraclavicular or axillary lymph nodes. Lungs are clear. Regular cardiac rhythm with occasional premature beat. Abdomen soft and nontender. No organomegaly. Extremities without edema. Calves soft and nontender.   Lab Results: Lab Results  Component Value Date   WBC 6.7 12/27/2011   HGB 13.4 12/27/2011   HCT 37.9 12/27/2011   MCV 91.3 12/27/2011   PLT 223 12/27/2011    Chemistry:    Chemistry      Component Value Date/Time   NA 140 12/27/2011 0947   NA 140 09/02/2011 0941   K 3.7 12/27/2011 0947   K 4.4 09/02/2011 0941   CL 105 12/27/2011 0947   CL 109 09/02/2011 0941   CO2 28 12/27/2011 0947   CO2 25 09/02/2011 0941   BUN 20.0 12/27/2011 0947   BUN 16 09/02/2011 0941   CREATININE 1.0 12/27/2011 0947   CREATININE 0.80 09/02/2011 0941      Component Value Date/Time   CALCIUM 10.1 12/27/2011 0947   CALCIUM 8.9 09/02/2011 0941   ALKPHOS 42 12/27/2011 0947   ALKPHOS 55 08/04/2011 1302   AST 16 12/27/2011 0947   AST 27 08/04/2011 1302   ALT 14 12/27/2011 0947   ALT 41* 08/04/2011 1302   BILITOT 0.99 12/27/2011 0947   BILITOT 1.1 08/04/2011 1302       Studies/Results: No results found.  Medications: I have reviewed the patient's current medications.  Assessment/Plan:  1. Chronic myelogenous leukemia -- initially treated with hydroxyurea. She began Gleevec on 08/27/2009. Gleevec was placed on hold on 10/06/2009 due to poor tolerance of the 400 mg daily dose. Gleevec was restarted when she had hematologic improvement on 10/13/2009 and the dose was changed to 300 mg on 10/26/2009. The  Gleevec dose was reduced to 200 mg daily due to excessive "tearing". The peripheral blood BCR/ABL returned at 16% on 04/19/2010. Gleevec was discontinued secondary to toxicity and a persistent elevation of the peripheral blood translocation . She began Nilotinib on 06/09/2011. Nilotinib placed on hold beginning 08/04/2011 due to fatigue/dyspnea on exertion and slight QT prolongation. Nilotinib was resumed on 08/15/2011 at a dose of 300 mg daily. Peripheral blood PCR was improved on 10/04/2011. 2. Diarrhea. Resolved since discontinuing Gleevec. 3. Periorbital edema -improved since stopping Gleevec. 4. Increased "tearing"-improved with discontinuance of Gleevec. She reports increased tearing and rhinorrhea over the past several months. Question seasonal allergies. 5. History of a fine papular rash over the trunk and legs-likely secondary to the nilotinib. No rash today. 6. Mild QT prolongation and multifocal atrial pacemaker on EKG 08/04/2011. QT normal on EKG today. 7. Mild hypokalemia 08/04/2011. Oral replacement initiated. The potassium was normal on labs 11/15/2011.  Disposition-Ms [name] appears stable. She remains in hematologic remission. We will follow-up on the peripheral blood PCR from today. She will continue Nilotinib and return for a follow-up visit, labs and EKG in 6-7 weeks. She will contact the office in the interim with any problems.  Plan reviewed with Dr. Truett Perna.  [name] ANP/GNP-BC

## 2011-12-27 NOTE — Telephone Encounter (Signed)
appts made and printed for pt aom °

## 2011-12-29 ENCOUNTER — Encounter: Payer: Self-pay | Admitting: *Deleted

## 2011-12-29 NOTE — Progress Notes (Signed)
RECEIVED A FAX FROM [facility] CONCERNING A CONFIRMATION OF PRESCRIPTION SHIPMENT FOR TASIGNA ON 12/28/11.

## 2012-01-06 ENCOUNTER — Telehealth: Payer: Self-pay | Admitting: *Deleted

## 2012-01-06 NOTE — Telephone Encounter (Signed)
Notified of negative PCR. Continue Nilotinib

## 2012-01-06 NOTE — Telephone Encounter (Signed)
Message copied by Wandalee Ferdinand on Fri Jan 06, 2012  2:15 PM ------      Message from: Thornton Papas B      Created: Wed Jan 04, 2012  7:55 PM       Please call patient, pcr is negative, cont. nilotinib

## 2012-01-27 ENCOUNTER — Encounter: Payer: Self-pay | Admitting: *Deleted

## 2012-01-27 NOTE — Progress Notes (Signed)
RECEIVED A FAX FROM [facility] CONCERNING A CONFIRMATION OF PRESCRIPTION SHIPMENT FOR TASIGNA ON 01/26/12.

## 2012-02-03 ENCOUNTER — Other Ambulatory Visit: Payer: Self-pay | Admitting: Nurse Practitioner

## 2012-02-14 ENCOUNTER — Other Ambulatory Visit: Payer: Self-pay

## 2012-02-14 ENCOUNTER — Telehealth: Payer: Self-pay | Admitting: Oncology

## 2012-02-14 ENCOUNTER — Ambulatory Visit (HOSPITAL_BASED_OUTPATIENT_CLINIC_OR_DEPARTMENT_OTHER): Payer: Medicare Other | Admitting: Oncology

## 2012-02-14 ENCOUNTER — Other Ambulatory Visit (HOSPITAL_BASED_OUTPATIENT_CLINIC_OR_DEPARTMENT_OTHER): Payer: Medicare Other | Admitting: Lab

## 2012-02-14 ENCOUNTER — Ambulatory Visit: Payer: Medicare Other

## 2012-02-14 DIAGNOSIS — C9210 Chronic myeloid leukemia, BCR/ABL-positive, not having achieved remission: Secondary | ICD-10-CM

## 2012-02-14 LAB — CBC WITH DIFFERENTIAL/PLATELET
BASO%: 1.2 % (ref 0.0–2.0)
Basophils Absolute: 0.1 10*3/uL (ref 0.0–0.1)
EOS%: 3.3 % (ref 0.0–7.0)
Eosinophils Absolute: 0.2 10*3/uL (ref 0.0–0.5)
HCT: 42.7 % (ref 34.8–46.6)
HGB: 14.8 g/dL (ref 11.6–15.9)
LYMPH%: 21.8 % (ref 14.0–49.7)
MCH: 32.3 pg (ref 25.1–34.0)
MCHC: 34.7 g/dL (ref 31.5–36.0)
MCV: 93.2 fL (ref 79.5–101.0)
MONO#: 0.6 10*3/uL (ref 0.1–0.9)
MONO%: 8 % (ref 0.0–14.0)
NEUT#: 4.6 10*3/uL (ref 1.5–6.5)
NEUT%: 65.7 % (ref 38.4–76.8)
Platelets: 252 10*3/uL (ref 145–400)
RBC: 4.58 10*6/uL (ref 3.70–5.45)
RDW: 12.6 % (ref 11.2–14.5)
WBC: 6.9 10*3/uL (ref 3.9–10.3)
lymph#: 1.5 10*3/uL (ref 0.9–3.3)

## 2012-02-14 LAB — COMPREHENSIVE METABOLIC PANEL (CC13)
ALT: 13 U/L (ref 0–55)
AST: 16 U/L (ref 5–34)
Albumin: 4.1 g/dL (ref 3.5–5.0)
Alkaline Phosphatase: 43 U/L (ref 40–150)
BUN: 20 mg/dL (ref 7.0–26.0)
CO2: 27 mEq/L (ref 22–29)
Calcium: 9.8 mg/dL (ref 8.4–10.4)
Chloride: 105 mEq/L (ref 98–107)
Creatinine: 1.2 mg/dL — ABNORMAL HIGH (ref 0.6–1.1)
Glucose: 136 mg/dl — ABNORMAL HIGH (ref 70–99)
Potassium: 3.4 mEq/L — ABNORMAL LOW (ref 3.5–5.1)
Sodium: 140 mEq/L (ref 136–145)
Total Bilirubin: 0.82 mg/dL (ref 0.20–1.20)
Total Protein: 7.4 g/dL (ref 6.4–8.3)

## 2012-02-14 NOTE — Telephone Encounter (Signed)
Gave pt appt for February and April 2014 labs and ML

## 2012-02-14 NOTE — Progress Notes (Signed)
   [facility]    OFFICE PROGRESS NOTE   INTERVAL HISTORY:   She returns as scheduled. She feels well. No diarrhea. No dyspnea.  Objective:  Vital signs in last 24 hours:  There were no vitals taken for this visit.    HEENT: No thrush or ulcers Resp: Lungs clear bilaterally Cardio: Regular rate and rhythm with premature beats GI: No hepatosplenomegaly Vascular: No leg edema  Skin: No rash     Lab Results:  Lab Results  Component Value Date   WBC 6.9 02/14/2012   HGB 14.8 02/14/2012   HCT 42.7 02/14/2012   MCV 93.2 02/14/2012   PLT 252 02/14/2012   ANC 4.6  BCR: ABL-negative on 12/27/2011  EKG-sinus rhythm with PACs, QTC-452    Medications: I have reviewed the patient's current medications.  Assessment/Plan: 1. Chronic myelogenous leukemia -- initially treated with hydroxyurea. She began Gleevec on 08/27/2009. Gleevec was placed on hold on 10/06/2009 due to poor tolerance of the 400 mg daily dose. Gleevec was restarted when she had hematologic improvement on 10/13/2009 and the dose was changed to 300 mg on 10/26/2009. The Gleevec dose was reduced to 200 mg daily due to excessive "tearing". The peripheral blood BCR/ABL returned at 16% on 04/19/2010. Gleevec was discontinued secondary to toxicity and a persistent elevation of the peripheral blood translocation . She began Nilotinib on 06/09/2011. Nilotinib placed on hold beginning 08/04/2011 due to fatigue/dyspnea on exertion and slight QT prolongation. Nilotinib was resumed on 08/15/2011 at a dose of 300 mg daily. Peripheral blood PCR was improved on negative on 12/27/2011. 2. Diarrhea. Resolved since discontinuing Gleevec. 3. Periorbital edema -improved since stopping Gleevec. 4. Increased "tearing"-improved with discontinuance of Gleevec.  5. History of a fine papular rash over the trunk and legs-likely secondary to the nilotinib. No rash today. 6. Mild QT prolongation and multifocal atrial pacemaker on EKG  08/04/2011. QT normal on EKG today. 7. Mild hypokalemia -maintained on oral potassium replacement   Disposition:  She appears stable. The plan is to continue nilotinib at the current dose. [name] will return for a lab visit in 6 weeks. She is scheduled for an office visit in 3 months.   Thornton Papas, MD  02/14/2012  3:03 PM

## 2012-02-17 ENCOUNTER — Other Ambulatory Visit: Payer: Self-pay | Admitting: *Deleted

## 2012-02-17 DIAGNOSIS — C9210 Chronic myeloid leukemia, BCR/ABL-positive, not having achieved remission: Secondary | ICD-10-CM

## 2012-02-17 MED ORDER — NILOTINIB HCL 150 MG PO CAPS: 300.0000 mg | ORAL_CAPSULE | Freq: Every day | ORAL | Status: DC

## 2012-02-17 NOTE — Telephone Encounter (Signed)
THIS REFILL REQUEST FOR TASIGNA WAS GIVEN TO DR.SHERRILL'S NURSE, TANYA WHITLOCK,RN.

## 2012-02-20 NOTE — Telephone Encounter (Signed)
RECEIVED A FAX FROM [facility] CONCERNING A CONFIRMATION OF [facility] RECEIPT FOR PT. REFERRAL. CHECKING ON INSURANCE COVERAGE.

## 2012-02-23 NOTE — Telephone Encounter (Signed)
RECEIVED A FAX FROM [facility] CONCERNING A CONFIRMATION OF PRESCRIPTION SHIPMENT FOR TASIGNA ON 02/21/12.

## 2012-03-27 ENCOUNTER — Other Ambulatory Visit: Payer: Medicare Other | Admitting: Lab

## 2012-03-27 DIAGNOSIS — C9210 Chronic myeloid leukemia, BCR/ABL-positive, not having achieved remission: Secondary | ICD-10-CM

## 2012-03-27 LAB — CBC WITH DIFFERENTIAL/PLATELET
BASO%: 1 % (ref 0.0–2.0)
Basophils Absolute: 0.1 10*3/uL (ref 0.0–0.1)
EOS%: 4.2 % (ref 0.0–7.0)
Eosinophils Absolute: 0.3 10*3/uL (ref 0.0–0.5)
HCT: 40.5 % (ref 34.8–46.6)
HGB: 14 g/dL (ref 11.6–15.9)
LYMPH%: 24.2 % (ref 14.0–49.7)
MCH: 31.6 pg (ref 25.1–34.0)
MCHC: 34.7 g/dL (ref 31.5–36.0)
MCV: 91.1 fL (ref 79.5–101.0)
MONO#: 0.7 10*3/uL (ref 0.1–0.9)
MONO%: 9.4 % (ref 0.0–14.0)
NEUT#: 4.4 10*3/uL (ref 1.5–6.5)
NEUT%: 61.2 % (ref 38.4–76.8)
Platelets: 219 10*3/uL (ref 145–400)
RBC: 4.45 10*6/uL (ref 3.70–5.45)
RDW: 12.5 % (ref 11.2–14.5)
WBC: 7.2 10*3/uL (ref 3.9–10.3)
lymph#: 1.8 10*3/uL (ref 0.9–3.3)

## 2012-03-27 LAB — COMPREHENSIVE METABOLIC PANEL (CC13)
ALT: 18 U/L (ref 0–55)
AST: 17 U/L (ref 5–34)
Albumin: 3.8 g/dL (ref 3.5–5.0)
Alkaline Phosphatase: 41 U/L (ref 40–150)
BUN: 16.4 mg/dL (ref 7.0–26.0)
CO2: 27 mEq/L (ref 22–29)
Calcium: 9.4 mg/dL (ref 8.4–10.4)
Chloride: 105 mEq/L (ref 98–107)
Creatinine: 1.1 mg/dL (ref 0.6–1.1)
Glucose: 93 mg/dl (ref 70–99)
Potassium: 3.9 mEq/L (ref 3.5–5.1)
Sodium: 141 mEq/L (ref 136–145)
Total Bilirubin: 0.92 mg/dL (ref 0.20–1.20)
Total Protein: 7.1 g/dL (ref 6.4–8.3)

## 2012-04-25 NOTE — Telephone Encounter (Signed)
Refill sent to patient on 04/24/12 for next day delivery.

## 2012-05-14 ENCOUNTER — Telehealth: Payer: Self-pay | Admitting: Oncology

## 2012-05-14 ENCOUNTER — Other Ambulatory Visit: Payer: Self-pay

## 2012-05-14 ENCOUNTER — Ambulatory Visit (HOSPITAL_BASED_OUTPATIENT_CLINIC_OR_DEPARTMENT_OTHER): Payer: Medicare Other | Admitting: Nurse Practitioner

## 2012-05-14 ENCOUNTER — Other Ambulatory Visit (HOSPITAL_BASED_OUTPATIENT_CLINIC_OR_DEPARTMENT_OTHER): Payer: Medicare Other | Admitting: Lab

## 2012-05-14 VITALS — BP 149/73 | HR 69 | Temp 97.6°F | Resp 18 | Ht 63.0 in | Wt 128.8 lb

## 2012-05-14 DIAGNOSIS — C9210 Chronic myeloid leukemia, BCR/ABL-positive, not having achieved remission: Secondary | ICD-10-CM

## 2012-05-14 DIAGNOSIS — E876 Hypokalemia: Secondary | ICD-10-CM

## 2012-05-14 LAB — CBC WITH DIFFERENTIAL/PLATELET
BASO%: 1.2 % (ref 0.0–2.0)
Basophils Absolute: 0.1 10*3/uL (ref 0.0–0.1)
EOS%: 4.1 % (ref 0.0–7.0)
Eosinophils Absolute: 0.2 10*3/uL (ref 0.0–0.5)
HCT: 39 % (ref 34.8–46.6)
HGB: 13.4 g/dL (ref 11.6–15.9)
LYMPH%: 26.9 % (ref 14.0–49.7)
MCH: 31.2 pg (ref 25.1–34.0)
MCHC: 34.4 g/dL (ref 31.5–36.0)
MCV: 90.8 fL (ref 79.5–101.0)
MONO#: 0.7 10*3/uL (ref 0.1–0.9)
MONO%: 11.6 % (ref 0.0–14.0)
NEUT#: 3.4 10*3/uL (ref 1.5–6.5)
NEUT%: 56.2 % (ref 38.4–76.8)
Platelets: 207 10*3/uL (ref 145–400)
RBC: 4.3 10*6/uL (ref 3.70–5.45)
RDW: 13.1 % (ref 11.2–14.5)
WBC: 6.1 10*3/uL (ref 3.9–10.3)
lymph#: 1.6 10*3/uL (ref 0.9–3.3)

## 2012-05-14 LAB — COMPREHENSIVE METABOLIC PANEL (CC13)
ALT: 12 U/L (ref 0–55)
AST: 16 U/L (ref 5–34)
Albumin: 3.8 g/dL (ref 3.5–5.0)
Alkaline Phosphatase: 37 U/L — ABNORMAL LOW (ref 40–150)
BUN: 16.7 mg/dL (ref 7.0–26.0)
CO2: 24 mEq/L (ref 22–29)
Calcium: 9.4 mg/dL (ref 8.4–10.4)
Chloride: 107 mEq/L (ref 98–107)
Creatinine: 1.1 mg/dL (ref 0.6–1.1)
Glucose: 76 mg/dl (ref 70–99)
Potassium: 4.1 mEq/L (ref 3.5–5.1)
Sodium: 141 mEq/L (ref 136–145)
Total Bilirubin: 0.72 mg/dL (ref 0.20–1.20)
Total Protein: 7 g/dL (ref 6.4–8.3)

## 2012-05-14 NOTE — Progress Notes (Signed)
OFFICE PROGRESS NOTE  Interval history:  [name] returns as scheduled. She feels well. No interim illnesses or infections. No diarrhea. She has occasional mild nausea. No vomiting. She has a good appetite. No shortness of breath or chest pain. No leg swelling. Only complaint is related to seasonal allergies.   Objective: Blood pressure 149/73, pulse 69, temperature 97.6 F (36.4 C), temperature source Oral, resp. rate 18, height 5\' 3"  (1.6 m), weight 128 lb 12.8 oz (58.423 kg).  Oropharynx is without thrush or ulceration. Lungs are clear. Regular cardiac rhythm. Abdomen is soft and nontender. No organomegaly. Extremities without edema. No skin rash.  Lab Results: Lab Results  Component Value Date   WBC 6.1 05/14/2012   HGB 13.4 05/14/2012   HCT 39.0 05/14/2012   MCV 90.8 05/14/2012   PLT 207 05/14/2012    Chemistry:    Chemistry      Component Value Date/Time   NA 141 05/14/2012 0958   NA 140 09/02/2011 0941   K 4.1 05/14/2012 0958   K 4.4 09/02/2011 0941   CL 107 05/14/2012 0958   CL 109 09/02/2011 0941   CO2 24 05/14/2012 0958   CO2 25 09/02/2011 0941   BUN 16.7 05/14/2012 0958   BUN 16 09/02/2011 0941   CREATININE 1.1 05/14/2012 0958   CREATININE 0.80 09/02/2011 0941      Component Value Date/Time   CALCIUM 9.4 05/14/2012 0958   CALCIUM 8.9 09/02/2011 0941   ALKPHOS 37* 05/14/2012 0958   ALKPHOS 55 08/04/2011 1302   AST 16 05/14/2012 0958   AST 27 08/04/2011 1302   ALT 12 05/14/2012 0958   ALT 41* 08/04/2011 1302   BILITOT 0.72 05/14/2012 0958   BILITOT 1.1 08/04/2011 1302     12/27/2011 BCR-ABL negative. 05/14/2012 EKG QTC 454.  Studies/Results: No results found.  Medications: I have reviewed the patient's current medications.  Assessment/Plan:  1. Chronic myelogenous leukemia -- initially treated with hydroxyurea. She began Gleevec on 08/27/2009. Gleevec was placed on hold on 10/06/2009 due to poor tolerance of the 400 mg daily dose. Gleevec was restarted when she had hematologic  improvement on 10/13/2009 and the dose was changed to 300 mg on 10/26/2009. The Gleevec dose was reduced to 200 mg daily due to excessive "tearing". The peripheral blood BCR/ABL returned at 16% on 04/19/2010. Gleevec was discontinued secondary to toxicity and a persistent elevation of the peripheral blood translocation . She began Nilotinib on 06/09/2011. Nilotinib placed on hold beginning 08/04/2011 due to fatigue/dyspnea on exertion and slight QT prolongation. Nilotinib was resumed on 08/15/2011 at a dose of 300 mg daily. Peripheral blood PCR was negative on 12/27/2011. 2. Diarrhea. Resolved since discontinuing Gleevec. 3. Periorbital edema -improved since stopping Gleevec. 4. Increased "tearing"-improved with discontinuance of Gleevec.  5. History of a fine papular rash over the trunk and legs-likely secondary to the nilotinib. No rash today. 6. Mild QT prolongation and multifocal atrial pacemaker on EKG 08/04/2011. QT normal on EKG today. 7. Mild hypokalemia -maintained on oral potassium replacement.  Disposition-[name] appears stable. Plan to continue nilotinib at the current dose. She will return for a lab visit in 6 weeks and a followup visit in 3 months. She will contact the office in the interim with any problems.  [name] ANP/GNP-BC

## 2012-05-15 ENCOUNTER — Other Ambulatory Visit: Payer: Self-pay | Admitting: *Deleted

## 2012-05-15 DIAGNOSIS — C9210 Chronic myeloid leukemia, BCR/ABL-positive, not having achieved remission: Secondary | ICD-10-CM

## 2012-05-15 MED ORDER — NILOTINIB HCL 150 MG PO CAPS: 300.0000 mg | ORAL_CAPSULE | Freq: Every day | ORAL | Status: DC

## 2012-05-15 NOTE — Addendum Note (Signed)
Addended by: Wandalee Ferdinand on: 05/15/2012 06:16 PM   Modules accepted: Orders

## 2012-05-15 NOTE — Telephone Encounter (Signed)
THIS REFILL REQUEST FOR TASIGNA WAS GIVEN TO DR.SHERRILL'S NURSE, SUSAN COWARD,RN. 

## 2012-05-16 NOTE — Telephone Encounter (Signed)
Rec'd confirmation of Tasigna Rx. [facility] will arrange for delivery arrangements.

## 2012-05-17 ENCOUNTER — Encounter: Payer: Self-pay | Admitting: Internal Medicine

## 2012-05-21 NOTE — Telephone Encounter (Signed)
RECEIVED A FAX FROM [facility] CONCERNING A CONFIRMATION OF PRESCRIPTION SHIPMENT FOR TASIGNA ON 05/18/12.

## 2012-06-08 ENCOUNTER — Other Ambulatory Visit: Payer: Self-pay

## 2012-06-08 DIAGNOSIS — Z1231 Encounter for screening mammogram for malignant neoplasm of breast: Secondary | ICD-10-CM

## 2012-06-19 ENCOUNTER — Encounter: Payer: Self-pay | Admitting: *Deleted

## 2012-06-19 NOTE — Progress Notes (Signed)
RECEIVED A FAX FROM [facility] CONCERNING A CONFIRMATION OF PRESCRIPTION SHIPMENT FOR TASIGNA ON 06/18/12.

## 2012-07-17 ENCOUNTER — Ambulatory Visit
Admission: RE | Admit: 2012-07-17 | Discharge: 2012-07-17 | Disposition: A | Discharge status: Home / Self Care | Payer: Medicare Other | Source: Ambulatory Visit

## 2012-07-17 DIAGNOSIS — Z1231 Encounter for screening mammogram for malignant neoplasm of breast: Secondary | ICD-10-CM

## 2012-07-18 ENCOUNTER — Telehealth: Payer: Self-pay | Admitting: *Deleted

## 2012-07-18 NOTE — Telephone Encounter (Signed)
Fax from [facility] that Tasigna was shipped on 07/17/12 for next day delivery.

## 2012-07-30 ENCOUNTER — Telehealth: Payer: Self-pay | Admitting: *Deleted

## 2012-07-30 DIAGNOSIS — C9210 Chronic myeloid leukemia, BCR/ABL-positive, not having achieved remission: Secondary | ICD-10-CM

## 2012-07-30 MED ORDER — POTASSIUM CHLORIDE CRYS ER 20 MEQ PO TBCR: EXTENDED_RELEASE_TABLET | ORAL | Status: DC

## 2012-07-30 NOTE — Telephone Encounter (Signed)
Left VM this morning requesting 3 month refill, but did not state name of drug.

## 2012-07-30 NOTE — Telephone Encounter (Signed)
Called patient back and she reports the med was her KCL 20 meq.

## 2012-08-13 ENCOUNTER — Other Ambulatory Visit: Payer: Self-pay | Admitting: *Deleted

## 2012-08-13 DIAGNOSIS — C9210 Chronic myeloid leukemia, BCR/ABL-positive, not having achieved remission: Secondary | ICD-10-CM

## 2012-08-13 NOTE — Telephone Encounter (Signed)
THIS REFILL REQUEST FOR TASIGNA WAS GIVEN TO DR.SHERRILL'S NURSE, SUSAN COWARD,RN. 

## 2012-08-14 MED ORDER — NILOTINIB HCL 150 MG PO CAPS: 300.0000 mg | ORAL_CAPSULE | Freq: Every day | ORAL | Status: DC

## 2012-08-14 NOTE — Addendum Note (Signed)
Addended by: Arvilla Meres on: 08/14/2012 04:04 PM   Modules accepted: Orders

## 2012-08-16 ENCOUNTER — Telehealth: Payer: Self-pay | Admitting: Oncology

## 2012-08-16 NOTE — Telephone Encounter (Signed)
R/S pt to [facility] per md

## 2012-08-17 NOTE — Telephone Encounter (Signed)
RECEIVED A FAX FROM [facility] CONCERNING A CONFIRMATION OF PRESCRIPTION SHIPMENT FOR TASIGNA ON 08/16/12.

## 2012-08-21 ENCOUNTER — Other Ambulatory Visit: Payer: Self-pay

## 2012-08-21 ENCOUNTER — Ambulatory Visit: Payer: Medicare Other | Admitting: Oncology

## 2012-08-21 ENCOUNTER — Ambulatory Visit: Payer: Medicare Other

## 2012-08-21 ENCOUNTER — Other Ambulatory Visit (HOSPITAL_BASED_OUTPATIENT_CLINIC_OR_DEPARTMENT_OTHER): Payer: Medicare Other | Admitting: Lab

## 2012-08-21 ENCOUNTER — Ambulatory Visit (HOSPITAL_BASED_OUTPATIENT_CLINIC_OR_DEPARTMENT_OTHER): Payer: Medicare Other | Admitting: Nurse Practitioner

## 2012-08-21 ENCOUNTER — Telehealth: Payer: Self-pay | Admitting: Oncology

## 2012-08-21 DIAGNOSIS — C9210 Chronic myeloid leukemia, BCR/ABL-positive, not having achieved remission: Secondary | ICD-10-CM

## 2012-08-21 LAB — CBC WITH DIFFERENTIAL/PLATELET
BASO%: 1.1 % (ref 0.0–2.0)
Basophils Absolute: 0.1 10*3/uL (ref 0.0–0.1)
EOS%: 6.1 % (ref 0.0–7.0)
Eosinophils Absolute: 0.3 10*3/uL (ref 0.0–0.5)
HCT: 36.7 % (ref 34.8–46.6)
HGB: 12.7 g/dL (ref 11.6–15.9)
LYMPH%: 28.9 % (ref 14.0–49.7)
MCH: 31 pg (ref 25.1–34.0)
MCHC: 34.7 g/dL (ref 31.5–36.0)
MCV: 89.6 fL (ref 79.5–101.0)
MONO#: 0.6 10*3/uL (ref 0.1–0.9)
MONO%: 11.5 % (ref 0.0–14.0)
NEUT#: 2.8 10*3/uL (ref 1.5–6.5)
NEUT%: 52.4 % (ref 38.4–76.8)
Platelets: 203 10*3/uL (ref 145–400)
RBC: 4.1 10*6/uL (ref 3.70–5.45)
RDW: 12.7 % (ref 11.2–14.5)
WBC: 5.3 10*3/uL (ref 3.9–10.3)
lymph#: 1.5 10*3/uL (ref 0.9–3.3)

## 2012-08-21 LAB — COMPREHENSIVE METABOLIC PANEL (CC13)
ALT: 18 U/L (ref 0–55)
AST: 18 U/L (ref 5–34)
Albumin: 3.7 g/dL (ref 3.5–5.0)
Alkaline Phosphatase: 34 U/L — ABNORMAL LOW (ref 40–150)
BUN: 13.6 mg/dL (ref 7.0–26.0)
CO2: 24 mEq/L (ref 22–29)
Calcium: 9.3 mg/dL (ref 8.4–10.4)
Chloride: 108 mEq/L (ref 98–109)
Creatinine: 1 mg/dL (ref 0.6–1.1)
Glucose: 90 mg/dl (ref 70–140)
Potassium: 4.3 mEq/L (ref 3.5–5.1)
Sodium: 140 mEq/L (ref 136–145)
Total Bilirubin: 0.72 mg/dL (ref 0.20–1.20)
Total Protein: 6.6 g/dL (ref 6.4–8.3)

## 2012-08-21 NOTE — Telephone Encounter (Signed)
gv and printed appt sched and avs for pt  °

## 2012-08-21 NOTE — Progress Notes (Signed)
OFFICE PROGRESS NOTE  Interval history:  [name] returns as scheduled. She continues Nilotinib. She denies nausea/vomiting. No mouth sores. No diarrhea. No skin rash. She denies shortness of breath. She has "itchy watery eyes".   Objective: There were no vitals taken for this visit.  Oropharynx is without thrush or ulceration. Lungs are clear. No palpable cervical, supraclavicular or axillary lymph nodes. Regular cardiac rhythm. Abdomen soft and nontender. No organomegaly. Extremities without edema. No skin rash.  Lab Results: Lab Results  Component Value Date   WBC 5.3 08/21/2012   HGB 12.7 08/21/2012   HCT 36.7 08/21/2012   MCV 89.6 08/21/2012   PLT 203 08/21/2012    Chemistry:    Chemistry      Component Value Date/Time   NA 140 08/21/2012 1026   NA 140 09/02/2011 0941   K 4.3 08/21/2012 1026   K 4.4 09/02/2011 0941   CL 107 05/14/2012 0958   CL 109 09/02/2011 0941   CO2 24 08/21/2012 1026   CO2 25 09/02/2011 0941   BUN 13.6 08/21/2012 1026   BUN 16 09/02/2011 0941   CREATININE 1.0 08/21/2012 1026   CREATININE 0.80 09/02/2011 0941      Component Value Date/Time   CALCIUM 9.3 08/21/2012 1026   CALCIUM 8.9 09/02/2011 0941   ALKPHOS 34* 08/21/2012 1026   ALKPHOS 55 08/04/2011 1302   AST 18 08/21/2012 1026   AST 27 08/04/2011 1302   ALT 18 08/21/2012 1026   ALT 41* 08/04/2011 1302   BILITOT 0.72 08/21/2012 1026   BILITOT 1.1 08/04/2011 1302       Studies/Results: No results found.  Medications: I have reviewed the patient's current medications.  Assessment/Plan:  1. Chronic myelogenous leukemia -- initially treated with hydroxyurea. She began Gleevec on 08/27/2009. Gleevec was placed on hold on 10/06/2009 due to poor tolerance of the 400 mg daily dose. Gleevec was restarted when she had hematologic improvement on 10/13/2009 and the dose was changed to 300 mg on 10/26/2009. The Gleevec dose was reduced to 200 mg daily due to excessive "tearing". The peripheral blood BCR/ABL returned  at 16% on 04/19/2010. Gleevec was discontinued secondary to toxicity and a persistent elevation of the peripheral blood translocation . She began Nilotinib on 06/09/2011. Nilotinib placed on hold beginning 08/04/2011 due to fatigue/dyspnea on exertion and slight QT prolongation. Nilotinib was resumed on 08/15/2011 at a dose of 300 mg daily. Peripheral blood PCR was negative on 12/27/2011 and 05/14/2012. 2. Diarrhea. Resolved since discontinuing Gleevec. 3. Periorbital edema -improved since stopping Gleevec. 4. Increased "tearing"-improved with discontinuance of Gleevec.  5. History of a fine papular rash over the trunk and legs-likely secondary to the nilotinib. No rash today. 6. Mild QT prolongation and multifocal atrial pacemaker on EKG 08/04/2011. QT normal on EKG today. 7. Mild hypokalemia -maintained on oral potassium replacement. Potassium in normal range on labs today.  Disposition-[name] appears stable. She will continue Nilotinib at the current dose. She will return for a lab visit in 6 weeks and a followup visit in 3 months. She will contact the office the interim with any problems.  Plan reviewed with Dr. Truett Perna.  Lonna Cobb ANP/GNP-BC

## 2012-10-02 ENCOUNTER — Other Ambulatory Visit (HOSPITAL_BASED_OUTPATIENT_CLINIC_OR_DEPARTMENT_OTHER): Payer: Medicare Other

## 2012-10-02 DIAGNOSIS — C9210 Chronic myeloid leukemia, BCR/ABL-positive, not having achieved remission: Secondary | ICD-10-CM

## 2012-10-02 LAB — CBC WITH DIFFERENTIAL/PLATELET
BASO%: 0.7 % (ref 0.0–2.0)
Basophils Absolute: 0 10*3/uL (ref 0.0–0.1)
EOS%: 7 % (ref 0.0–7.0)
Eosinophils Absolute: 0.4 10*3/uL (ref 0.0–0.5)
HCT: 37.7 % (ref 34.8–46.6)
HGB: 12.9 g/dL (ref 11.6–15.9)
LYMPH%: 30.2 % (ref 14.0–49.7)
MCH: 30.4 pg (ref 25.1–34.0)
MCHC: 34.2 g/dL (ref 31.5–36.0)
MCV: 88.7 fL (ref 79.5–101.0)
MONO#: 0.6 10*3/uL (ref 0.1–0.9)
MONO%: 11.1 % (ref 0.0–14.0)
NEUT#: 2.9 10*3/uL (ref 1.5–6.5)
NEUT%: 51 % (ref 38.4–76.8)
Platelets: 201 10*3/uL (ref 145–400)
RBC: 4.25 10*6/uL (ref 3.70–5.45)
RDW: 12.7 % (ref 11.2–14.5)
WBC: 5.6 10*3/uL (ref 3.9–10.3)
lymph#: 1.7 10*3/uL (ref 0.9–3.3)

## 2012-10-12 ENCOUNTER — Encounter: Payer: Self-pay | Admitting: *Deleted

## 2012-10-12 NOTE — Progress Notes (Signed)
RECEIVED A FAX FROM [facility] CONCERNING A CONFIRMATION OF PRESCRIPTION SHIPMENT FOR TASIGNA ON 10/11/12.

## 2012-11-02 ENCOUNTER — Other Ambulatory Visit: Payer: Self-pay | Admitting: *Deleted

## 2012-11-02 NOTE — Telephone Encounter (Signed)
THIS REFILL REQUEST FOR TASIGNA WAS PLACED ON DR.SHERRILL'S DESK. 

## 2012-11-05 ENCOUNTER — Telehealth: Payer: Self-pay | Admitting: *Deleted

## 2012-11-05 DIAGNOSIS — C9210 Chronic myeloid leukemia, BCR/ABL-positive, not having achieved remission: Secondary | ICD-10-CM

## 2012-11-05 MED ORDER — NILOTINIB HCL 150 MG PO CAPS: 300.0000 mg | ORAL_CAPSULE | Freq: Every day | ORAL | Status: DC

## 2012-11-09 NOTE — Telephone Encounter (Signed)
RECEIVED A FAX FROM [facility] CONCERNING A CONFIRMATION OF PRESCRIPTION SHIPMENT FOR TASIGNA ON 11/08/12.

## 2012-11-27 ENCOUNTER — Telehealth: Payer: Self-pay | Admitting: Oncology

## 2012-11-27 ENCOUNTER — Other Ambulatory Visit: Payer: Self-pay

## 2012-11-27 ENCOUNTER — Ambulatory Visit (HOSPITAL_BASED_OUTPATIENT_CLINIC_OR_DEPARTMENT_OTHER): Payer: Medicare Other | Admitting: Oncology

## 2012-11-27 ENCOUNTER — Other Ambulatory Visit (HOSPITAL_BASED_OUTPATIENT_CLINIC_OR_DEPARTMENT_OTHER): Payer: Medicare Other | Admitting: Lab

## 2012-11-27 VITALS — BP 154/70 | HR 61 | Temp 97.9°F | Resp 18 | Ht 63.0 in | Wt 130.3 lb

## 2012-11-27 DIAGNOSIS — C9210 Chronic myeloid leukemia, BCR/ABL-positive, not having achieved remission: Secondary | ICD-10-CM

## 2012-11-27 LAB — COMPREHENSIVE METABOLIC PANEL (CC13)
ALT: 18 U/L (ref 0–55)
AST: 18 U/L (ref 5–34)
Albumin: 3.8 g/dL (ref 3.5–5.0)
Alkaline Phosphatase: 37 U/L — ABNORMAL LOW (ref 40–150)
Anion Gap: 10 mEq/L (ref 3–11)
BUN: 17.7 mg/dL (ref 7.0–26.0)
CO2: 25 mEq/L (ref 22–29)
Calcium: 9.6 mg/dL (ref 8.4–10.4)
Chloride: 108 mEq/L (ref 98–109)
Creatinine: 1.1 mg/dL (ref 0.6–1.1)
Glucose: 91 mg/dl (ref 70–140)
Potassium: 4.3 mEq/L (ref 3.5–5.1)
Sodium: 143 mEq/L (ref 136–145)
Total Bilirubin: 0.82 mg/dL (ref 0.20–1.20)
Total Protein: 7.1 g/dL (ref 6.4–8.3)

## 2012-11-27 LAB — CBC WITH DIFFERENTIAL/PLATELET
BASO%: 1.3 % (ref 0.0–2.0)
Basophils Absolute: 0.1 10*3/uL (ref 0.0–0.1)
EOS%: 6 % (ref 0.0–7.0)
Eosinophils Absolute: 0.4 10*3/uL (ref 0.0–0.5)
HCT: 39.7 % (ref 34.8–46.6)
HGB: 13.8 g/dL (ref 11.6–15.9)
LYMPH%: 26.7 % (ref 14.0–49.7)
MCH: 30.9 pg (ref 25.1–34.0)
MCHC: 34.9 g/dL (ref 31.5–36.0)
MCV: 88.6 fL (ref 79.5–101.0)
MONO#: 0.7 10*3/uL (ref 0.1–0.9)
MONO%: 10.4 % (ref 0.0–14.0)
NEUT#: 3.9 10*3/uL (ref 1.5–6.5)
NEUT%: 55.6 % (ref 38.4–76.8)
Platelets: 238 10*3/uL (ref 145–400)
RBC: 4.48 10*6/uL (ref 3.70–5.45)
RDW: 12.9 % (ref 11.2–14.5)
WBC: 6.9 10*3/uL (ref 3.9–10.3)
lymph#: 1.9 10*3/uL (ref 0.9–3.3)

## 2012-11-27 NOTE — Telephone Encounter (Signed)
gv and printed appt sched and avs for pt for Nov Jan 2015

## 2012-11-27 NOTE — Progress Notes (Signed)
   [facility]    OFFICE PROGRESS NOTE   INTERVAL HISTORY:   [name] returns for scheduled followup of CML. She continues nilotinib. No complaint. Persistent periorbital edema.  Objective:  Vital signs in last 24 hours:  Blood pressure 154/70, pulse 61, temperature 97.9 F (36.6 C), temperature source Oral, resp. rate 18, height 5\' 3"  (1.6 m), weight 130 lb 4.8 oz (59.104 kg).    HEENT: No thrush or ulcer, mild periorbital edema Resp: Lungs clear bilaterally Cardio: Regular rate and rhythm GI: No hepatosplenomegaly Vascular: No leg  Skin: No rash     Lab Results:  Lab Results  Component Value Date   WBC 6.9 11/27/2012   HGB 13.8 11/27/2012   HCT 39.7 11/27/2012   MCV 88.6 11/27/2012   PLT 238 11/27/2012   ANC 3.9 Potassium 4.3, creatinine 1.1  Peripheral blood BCR/ABL on 10/02/2012 0.28%  EKG: Sinus bradycardia, QT 448, QTC 416    Medications: I have reviewed the patient's current medications.  Assessment/Plan: 1. Chronic myelogenous leukemia -- initially treated with hydroxyurea. She began Gleevec on 08/27/2009. Gleevec was placed on hold on 10/06/2009 due to poor tolerance of the 400 mg daily dose. Gleevec was restarted when she had hematologic improvement on 10/13/2009 and the dose was changed to 300 mg on 10/26/2009. The Gleevec dose was reduced to 200 mg daily due to excessive "tearing". The peripheral blood BCR/ABL returned at 16% on 04/19/2010. Gleevec was discontinued secondary to toxicity and a persistent elevation of the peripheral blood translocation . She began Nilotinib on 06/09/2011. Nilotinib placed on hold beginning 08/04/2011 due to fatigue/dyspnea on exertion and slight QT prolongation. Nilotinib was resumed on 08/15/2011 at a dose of 300 mg daily. Peripheral blood PCR was negative on 12/27/2011 and 05/14/2012. Mild elevation of the peripheral blood PCR on 10/02/2012. 2. Diarrhea. Resolved since discontinuing  Gleevec. 3. Periorbital edema -improved since stopping Gleevec. 4. Increased "tearing"-improved with discontinuance of Gleevec.  5. History of a fine papular rash over the trunk and legs-likely secondary to the nilotinib. No rash today. 6. Mild QT prolongation and multifocal atrial pacemaker on EKG 08/04/2011. QT normal on EKG today. 7. History of Mild hypokalemia -maintained on oral potassium replacement. Potassium in normal range on labs today.   Disposition:  She appears stable. The peripheral blood PCR was slightly elevated in August. She will return for a repeat PCR in approximately 6 weeks. Ms. Mogle will continue nilotinib. She will return for an office visit in 3 months.   Thornton Papas, MD  11/27/2012  1:35 PM

## 2012-12-07 ENCOUNTER — Encounter: Payer: Self-pay | Admitting: *Deleted

## 2012-12-07 NOTE — Progress Notes (Signed)
RECEIVED A FAX FROM [facility] CONCERNING A CONFIRMATION OF PRESCRIPTION SHIPMENT FOR TASIGNA ON 12/06/12.

## 2013-01-01 ENCOUNTER — Other Ambulatory Visit (HOSPITAL_BASED_OUTPATIENT_CLINIC_OR_DEPARTMENT_OTHER): Payer: Medicare Other

## 2013-01-01 DIAGNOSIS — C9210 Chronic myeloid leukemia, BCR/ABL-positive, not having achieved remission: Secondary | ICD-10-CM

## 2013-01-01 LAB — CBC WITH DIFFERENTIAL/PLATELET
BASO%: 0.3 % (ref 0.0–2.0)
Basophils Absolute: 0 10*3/uL (ref 0.0–0.1)
EOS%: 7.2 % — ABNORMAL HIGH (ref 0.0–7.0)
Eosinophils Absolute: 0.4 10*3/uL (ref 0.0–0.5)
HCT: 38.3 % (ref 34.8–46.6)
HGB: 13.1 g/dL (ref 11.6–15.9)
LYMPH%: 28.1 % (ref 14.0–49.7)
MCH: 30.2 pg (ref 25.1–34.0)
MCHC: 34.2 g/dL (ref 31.5–36.0)
MCV: 88.4 fL (ref 79.5–101.0)
MONO#: 0.6 10*3/uL (ref 0.1–0.9)
MONO%: 10.2 % (ref 0.0–14.0)
NEUT#: 3.1 10*3/uL (ref 1.5–6.5)
NEUT%: 54.2 % (ref 38.4–76.8)
Platelets: 213 10*3/uL (ref 145–400)
RBC: 4.33 10*6/uL (ref 3.70–5.45)
RDW: 12.9 % (ref 11.2–14.5)
WBC: 5.7 10*3/uL (ref 3.9–10.3)
lymph#: 1.6 10*3/uL (ref 0.9–3.3)

## 2013-01-04 ENCOUNTER — Encounter: Payer: Self-pay | Admitting: *Deleted

## 2013-01-04 NOTE — Progress Notes (Signed)
RECEIVED A FAX FROM [facility] CONCERNING A CONFIRMATION OF PRESCRIPTION SHIPMENT FOR TASIGNA ON 01/02/13.

## 2013-01-08 ENCOUNTER — Telehealth: Payer: Self-pay | Admitting: *Deleted

## 2013-01-08 NOTE — Telephone Encounter (Signed)
Message copied by Caleb Popp on Tue Jan 08, 2013  5:48 PM ------      Message from: Thornton Papas B      Created: Mon Jan 07, 2013  8:21 PM       Please call patient, pcr looks good, cont. Nilotinib, f/u as scheduled ------

## 2013-01-08 NOTE — Telephone Encounter (Signed)
Called pt with lab results. PCR looks good, per Dr. Truett Perna. Continue Nilotinib. Pt voiced understanding.

## 2013-01-22 ENCOUNTER — Other Ambulatory Visit: Payer: Self-pay | Admitting: Medical Oncology

## 2013-01-22 DIAGNOSIS — C9210 Chronic myeloid leukemia, BCR/ABL-positive, not having achieved remission: Secondary | ICD-10-CM

## 2013-01-22 MED ORDER — NILOTINIB HCL 150 MG PO CAPS: 300.0000 mg | ORAL_CAPSULE | Freq: Every day | ORAL | Status: DC

## 2013-01-22 NOTE — Telephone Encounter (Signed)
Received a refill request from [facility] for Tasigna. Given to Dr. Truett Perna for signature.

## 2013-01-23 ENCOUNTER — Other Ambulatory Visit: Payer: Self-pay | Admitting: *Deleted

## 2013-01-23 NOTE — Telephone Encounter (Signed)
Notification from [facility] that referral for Tasigna has been received and in insurance verification process.

## 2013-02-04 ENCOUNTER — Telehealth: Payer: Self-pay | Admitting: *Deleted

## 2013-02-04 NOTE — Telephone Encounter (Signed)
Received fax from [facility]- Tasigna shipped 02/01/13

## 2013-02-19 ENCOUNTER — Ambulatory Visit (HOSPITAL_BASED_OUTPATIENT_CLINIC_OR_DEPARTMENT_OTHER): Payer: Medicare Other | Admitting: Nurse Practitioner

## 2013-02-19 ENCOUNTER — Other Ambulatory Visit (HOSPITAL_BASED_OUTPATIENT_CLINIC_OR_DEPARTMENT_OTHER): Payer: Medicare Other

## 2013-02-19 ENCOUNTER — Telehealth: Payer: Self-pay | Admitting: Oncology

## 2013-02-19 VITALS — BP 144/89 | HR 70 | Temp 98.2°F | Resp 18 | Ht 63.0 in | Wt 124.3 lb

## 2013-02-19 DIAGNOSIS — C9210 Chronic myeloid leukemia, BCR/ABL-positive, not having achieved remission: Secondary | ICD-10-CM

## 2013-02-19 DIAGNOSIS — R197 Diarrhea, unspecified: Secondary | ICD-10-CM

## 2013-02-19 DIAGNOSIS — L819 Disorder of pigmentation, unspecified: Secondary | ICD-10-CM

## 2013-02-19 LAB — CBC WITH DIFFERENTIAL/PLATELET
BASO%: 1.2 % (ref 0.0–2.0)
Basophils Absolute: 0.1 10*3/uL (ref 0.0–0.1)
EOS%: 4 % (ref 0.0–7.0)
Eosinophils Absolute: 0.2 10*3/uL (ref 0.0–0.5)
HCT: 41.8 % (ref 34.8–46.6)
HGB: 14.5 g/dL (ref 11.6–15.9)
LYMPH%: 25.7 % (ref 14.0–49.7)
MCH: 31 pg (ref 25.1–34.0)
MCHC: 34.6 g/dL (ref 31.5–36.0)
MCV: 89.5 fL (ref 79.5–101.0)
MONO#: 0.6 10*3/uL (ref 0.1–0.9)
MONO%: 9.9 % (ref 0.0–14.0)
NEUT#: 3.7 10*3/uL (ref 1.5–6.5)
NEUT%: 59.2 % (ref 38.4–76.8)
Platelets: 246 10*3/uL (ref 145–400)
RBC: 4.67 10*6/uL (ref 3.70–5.45)
RDW: 12.8 % (ref 11.2–14.5)
WBC: 6.2 10*3/uL (ref 3.9–10.3)
lymph#: 1.6 10*3/uL (ref 0.9–3.3)

## 2013-02-19 LAB — COMPREHENSIVE METABOLIC PANEL (CC13)
ALT: 12 U/L (ref 0–55)
AST: 16 U/L (ref 5–34)
Albumin: 4.2 g/dL (ref 3.5–5.0)
Alkaline Phosphatase: 42 U/L (ref 40–150)
Anion Gap: 13 mEq/L — ABNORMAL HIGH (ref 3–11)
BUN: 22 mg/dL (ref 7.0–26.0)
CO2: 25 mEq/L (ref 22–29)
Calcium: 9.8 mg/dL (ref 8.4–10.4)
Chloride: 103 mEq/L (ref 98–109)
Creatinine: 1.2 mg/dL — ABNORMAL HIGH (ref 0.6–1.1)
Glucose: 134 mg/dl (ref 70–140)
Potassium: 3.7 mEq/L (ref 3.5–5.1)
Sodium: 141 mEq/L (ref 136–145)
Total Bilirubin: 1.01 mg/dL (ref 0.20–1.20)
Total Protein: 7.4 g/dL (ref 6.4–8.3)

## 2013-02-19 NOTE — Progress Notes (Addendum)
OFFICE PROGRESS NOTE  Interval history:  [name] returns for followup of CML. She continues Nilotinib. She denies nausea/vomiting. No mouth sores. No diarrhea. She denies abdominal pain. No fever or other signs of infection. She continues to note periorbital edema. No leg swelling. No shortness of breath. She has recently noted scattered areas of skin hypopigmentation over the dorsal aspects of the hands and forearms.   Objective: Filed Vitals:   02/19/13 0917  BP: 150/98  Pulse: 70  Temp: 98.2 F (36.8 C)  Resp: 18   repeat blood pressure 144/89.   Oropharynx is without thrush or ulceration. Trace periorbital edema. No palpable cervical or supraclavicular lymph nodes. Lungs are clear. Regular cardiac rhythm. Abdomen soft and nontender. No organomegaly. No leg edema. Calves soft and nontender. Dorsal aspect of the hands, forearms, upper back and lower legs with scattered areas of skin hypopigmentation.   Lab Results: Lab Results  Component Value Date   WBC 6.2 02/19/2013   HGB 14.5 02/19/2013   HCT 41.8 02/19/2013   MCV 89.5 02/19/2013   PLT 246 02/19/2013   NEUTROABS 3.7 02/19/2013    Chemistry:    Chemistry      Component Value Date/Time   NA 141 02/19/2013 0900   NA 140 09/02/2011 0941   K 3.7 02/19/2013 0900   K 4.4 09/02/2011 0941   CL 107 05/14/2012 0958   CL 109 09/02/2011 0941   CO2 25 02/19/2013 0900   CO2 25 09/02/2011 0941   BUN 22.0 02/19/2013 0900   BUN 16 09/02/2011 0941   CREATININE 1.2* 02/19/2013 0900   CREATININE 0.80 09/02/2011 0941      Component Value Date/Time   CALCIUM 9.8 02/19/2013 0900   CALCIUM 8.9 09/02/2011 0941   ALKPHOS 42 02/19/2013 0900   ALKPHOS 55 08/04/2011 1302   AST 16 02/19/2013 0900   AST 27 08/04/2011 1302   ALT 12 02/19/2013 0900   ALT 41* 08/04/2011 1302   BILITOT 1.01 02/19/2013 0900   BILITOT 1.1 08/04/2011 1302       Studies/Results: No results found.  Medications: I have reviewed the patient's current  medications.  Assessment/Plan: 1. Chronic myelogenous leukemia -- initially treated with hydroxyurea. She began [place] on 08/27/2009. Gleevec was placed on hold on 10/06/2009 due to poor tolerance of the 400 mg daily dose. Gleevec was restarted when she had hematologic improvement on 10/13/2009 and the dose was changed to 300 mg on 10/26/2009. The Gleevec dose was reduced to 200 mg daily due to excessive "tearing". The peripheral blood BCR/ABL returned at 16% on 04/19/2010. Gleevec was discontinued secondary to toxicity and a persistent elevation of the peripheral blood translocation . She began Nilotinib on 06/09/2011. Nilotinib placed on hold beginning 08/04/2011 due to fatigue/dyspnea on exertion and slight QT prolongation. Nilotinib was resumed on 08/15/2011 at a dose of 300 mg daily. Peripheral blood PCR was negative on 12/27/2011 and 05/14/2012. Mild elevation of the peripheral blood PCR on 10/02/2012. Improved on repeat peripheral blood PCR 01/01/2013. 2. Diarrhea. Resolved since discontinuing Gleevec. 3. Periorbital edema -improved since stopping Gleevec. 4. Increased "tearing"-improved with discontinuance of Gleevec.  5. History of a fine papular rash over the trunk and legs-likely secondary to the nilotinib.  6. Mild QT prolongation and multifocal atrial pacemaker on EKG 08/04/2011. QT normal on EKG 11/27/2012. 7. History of mild hypokalemia -maintained on oral potassium replacement. Potassium in normal range on labs today. 8. Scattered areas of skin hypopigmentation. She declines a referral to dermatology.  Dispositon-she appears stable. She continues Nilotinib. The peripheral blood PCR from 01/01/2013 was improved. We will followup on the result from today. She will return for labs, EKG and a followup visit in 3 months. She will contact the office in the interim with any problems.  The etiology of the areas of the skin hypopigmentation is unclear. We feel it is unlikely to be related to  Nilotinib. We discussed a referral to dermatology which she declines.  Patient seen with Dr. Benay Spice.   [name] ANP/GNP-BC   This was a shared visit with [name]. [name] in hematologic remission from the [facility]. She will continue Nilotinib.  [name], M.D.

## 2013-02-19 NOTE — Telephone Encounter (Signed)
GV PT APPT SCHEDULE FOR APRIL °

## 2013-02-23 ENCOUNTER — Telehealth: Payer: Self-pay | Admitting: *Deleted

## 2013-02-23 NOTE — Telephone Encounter (Signed)
Message copied by Tania Ade on Sat Feb 23, 2013 12:57 PM ------      Message from: Betsy Coder B      Created: Fri Feb 22, 2013  9:05 PM       Please call patient, pcr is stable, continue tasigna, repeat 62mo. ------

## 2013-02-23 NOTE — Telephone Encounter (Signed)
Notified of normal results and to recheck in 3 months. Continue [place]

## 2013-03-05 ENCOUNTER — Encounter: Payer: Self-pay | Admitting: *Deleted

## 2013-03-05 NOTE — Progress Notes (Signed)
RECEIVED A FAX FROM [facility] CONCERNING A CONFIRMATION OF PRESCRIPTION SHIPMENT FOR [place] ON 03/04/13.

## 2013-03-08 ENCOUNTER — Ambulatory Visit: Payer: Medicare Other

## 2013-03-15 ENCOUNTER — Ambulatory Visit: Payer: Medicare Other

## 2013-04-02 ENCOUNTER — Encounter: Payer: Self-pay | Admitting: *Deleted

## 2013-04-02 NOTE — Progress Notes (Signed)
RECEIVED A FAX FROM [facility] CONCERNING A CONFIRMATION OF PRESCRIPTION SHIPMENT FOR [place] ON 04/01/13.

## 2013-04-23 ENCOUNTER — Other Ambulatory Visit: Payer: Self-pay | Admitting: *Deleted

## 2013-04-23 NOTE — Telephone Encounter (Signed)
THIS REFILL REQUEST FOR TASIGNA WAS GIVEN TO DR.SHERRILL'S NURSE, AMY HORTON,RN.

## 2013-04-24 ENCOUNTER — Other Ambulatory Visit: Payer: Self-pay | Admitting: *Deleted

## 2013-04-24 DIAGNOSIS — C9210 Chronic myeloid leukemia, BCR/ABL-positive, not having achieved remission: Secondary | ICD-10-CM

## 2013-04-24 MED ORDER — NILOTINIB HCL 150 MG PO CAPS: 300.0000 mg | ORAL_CAPSULE | Freq: Every day | ORAL | Status: DC

## 2013-04-25 NOTE — Telephone Encounter (Signed)
RECEIVED A FAX FROM [facility] CONCERNING A CONFIRMATION OF FACSIMILE RECEIPT FOR PT. REFERRAL. 

## 2013-04-29 NOTE — Telephone Encounter (Signed)
RECEIVED A FAX FROM [facility] CONCERNING A CONFIRMATION OF PRESCRIPTION SHIPMENT FOR [place] ON 04/26/13.

## 2013-05-21 ENCOUNTER — Other Ambulatory Visit (HOSPITAL_BASED_OUTPATIENT_CLINIC_OR_DEPARTMENT_OTHER): Payer: Medicare Other

## 2013-05-21 ENCOUNTER — Other Ambulatory Visit: Payer: Self-pay

## 2013-05-21 ENCOUNTER — Ambulatory Visit (HOSPITAL_BASED_OUTPATIENT_CLINIC_OR_DEPARTMENT_OTHER): Payer: Medicare Other | Admitting: Oncology

## 2013-05-21 ENCOUNTER — Telehealth: Payer: Self-pay | Admitting: Oncology

## 2013-05-21 VITALS — BP 179/81 | HR 74 | Temp 98.4°F | Resp 18 | Ht 63.0 in | Wt 125.5 lb

## 2013-05-21 DIAGNOSIS — C9210 Chronic myeloid leukemia, BCR/ABL-positive, not having achieved remission: Secondary | ICD-10-CM

## 2013-05-21 LAB — COMPREHENSIVE METABOLIC PANEL (CC13)
ALT: 11 U/L (ref 0–55)
AST: 16 U/L (ref 5–34)
Albumin: 4.1 g/dL (ref 3.5–5.0)
Alkaline Phosphatase: 36 U/L — ABNORMAL LOW (ref 40–150)
Anion Gap: 10 mEq/L (ref 3–11)
BUN: 16.5 mg/dL (ref 7.0–26.0)
CO2: 26 mEq/L (ref 22–29)
Calcium: 9.5 mg/dL (ref 8.4–10.4)
Chloride: 107 mEq/L (ref 98–109)
Creatinine: 1 mg/dL (ref 0.6–1.1)
Glucose: 106 mg/dl (ref 70–140)
Potassium: 3.9 mEq/L (ref 3.5–5.1)
Sodium: 143 mEq/L (ref 136–145)
Total Bilirubin: 0.85 mg/dL (ref 0.20–1.20)
Total Protein: 7 g/dL (ref 6.4–8.3)

## 2013-05-21 LAB — CBC WITH DIFFERENTIAL/PLATELET
BASO%: 0.9 % (ref 0.0–2.0)
Basophils Absolute: 0.1 10*3/uL (ref 0.0–0.1)
EOS%: 4.4 % (ref 0.0–7.0)
Eosinophils Absolute: 0.3 10*3/uL (ref 0.0–0.5)
HCT: 40.2 % (ref 34.8–46.6)
HGB: 13.9 g/dL (ref 11.6–15.9)
LYMPH%: 23 % (ref 14.0–49.7)
MCH: 31.5 pg (ref 25.1–34.0)
MCHC: 34.6 g/dL (ref 31.5–36.0)
MCV: 91.1 fL (ref 79.5–101.0)
MONO#: 0.6 10*3/uL (ref 0.1–0.9)
MONO%: 8.6 % (ref 0.0–14.0)
NEUT#: 4.1 10*3/uL (ref 1.5–6.5)
NEUT%: 63.1 % (ref 38.4–76.8)
Platelets: 214 10*3/uL (ref 145–400)
RBC: 4.41 10*6/uL (ref 3.70–5.45)
RDW: 12.8 % (ref 11.2–14.5)
WBC: 6.4 10*3/uL (ref 3.9–10.3)
lymph#: 1.5 10*3/uL (ref 0.9–3.3)

## 2013-05-21 NOTE — Progress Notes (Signed)
  [place] OFFICE PROGRESS NOTE   Diagnosis: CML  INTERVAL HISTORY:   She returns as scheduled. She continues nilotinib. She complains of "allergies "affecting her eyes. This improves with an over-the-counter allergy medication. No rash or diarrhea.  Objective:  Vital signs in last 24 hours:  Blood pressure 179/81, pulse 74, temperature 98.4 F (36.9 C), temperature source Oral, resp. rate 18, height 5\' 3"  (1.6 m), weight 125 lb 8 oz (56.926 kg), SpO2 100.00%.    HEENT: No thrush or ulcers Resp: Lungs clear bilaterally Cardio: Regular rate and rhythm, 2/6 systolic murmur GI: No hepatosplenomegaly Vascular: No leg edema   Lab Results:  Lab Results  Component Value Date   WBC 6.4 05/21/2013   HGB 13.9 05/21/2013   HCT 40.2 05/21/2013   MCV 91.1 05/21/2013   PLT 214 05/21/2013   NEUTROABS 4.1 05/21/2013    BCR: ABL on 02/21/2013-0.15%  EKG: Normal sinus rhythm, [id number]    Medications: I have reviewed the patient's current medications.  Assessment/Plan: 1. Chronic myelogenous leukemia -- initially treated with hydroxyurea. She began [place] on 08/27/2009. Gleevec was placed on hold on 10/06/2009 due to poor tolerance of the 400 mg daily dose. Gleevec was restarted when she had hematologic improvement on 10/13/2009 and the dose was changed to 300 mg on 10/26/2009. The Gleevec dose was reduced to 200 mg daily due to excessive "tearing". The peripheral blood BCR/ABL returned at 16% on 04/19/2010. Gleevec was discontinued secondary to toxicity and a persistent elevation of the peripheral blood translocation . She began Nilotinib on 06/09/2011. Nilotinib placed on hold beginning 08/04/2011 due to fatigue/dyspnea on exertion and slight QT prolongation. Nilotinib was resumed on 08/15/2011 at a dose of 300 mg daily. Peripheral blood PCR was negative on 12/27/2011 and 05/14/2012. Mild elevation of the peripheral blood PCR on 10/02/2012. Improved on repeat peripheral blood  PCR 01/01/2013. Stable 02/21/2013 2. Diarrhea. Resolved since discontinuing Gleevec. 3. Periorbital edema -improved since stopping Gleevec. 4. Increased "tearing"-improved with discontinuance of Gleevec.  5. History of a fine papular rash over the trunk and legs-likely secondary to the nilotinib.  6. Mild QT prolongation and multifocal atrial pacemaker on EKG 08/04/2011. QT normal on EKG 05/21/2013 7. History of mild hypokalemia -maintained on oral potassium replacement. Potassium in normal range on labs today.   Disposition:  She remains in hematologic remission from CML. We will followup on the peripheral blood PCR from today. She will continue Nilotinib. [name] will return for an office and lab visit in 3 months.  Ladell Pier, MD  05/21/2013  10:44 AM

## 2013-05-21 NOTE — Telephone Encounter (Signed)
Gave pt appt for l;ab and MD on july 2015 °

## 2013-05-28 ENCOUNTER — Telehealth: Payer: Self-pay | Admitting: *Deleted

## 2013-05-28 NOTE — Telephone Encounter (Signed)
Called pt with PCR results. Looks good, per Dr. Benay Spice. Follow up as scheduled. Pt voiced understanding.

## 2013-05-28 NOTE — Telephone Encounter (Signed)
Message copied by Brien Few on Tue May 28, 2013 12:03 PM ------      Message from: Ladell Pier      Created: Fri May 24, 2013  4:34 PM       Please call patient, pcr looks good, f/u as scheduled ------

## 2013-06-10 ENCOUNTER — Other Ambulatory Visit: Payer: Self-pay

## 2013-06-10 DIAGNOSIS — Z1231 Encounter for screening mammogram for malignant neoplasm of breast: Secondary | ICD-10-CM

## 2013-06-25 ENCOUNTER — Other Ambulatory Visit: Payer: Self-pay | Admitting: *Deleted

## 2013-06-25 DIAGNOSIS — C9210 Chronic myeloid leukemia, BCR/ABL-positive, not having achieved remission: Secondary | ICD-10-CM

## 2013-06-25 NOTE — Telephone Encounter (Signed)
THIS REFILL REQUEST FOR TASIGNA WAS PLACED ON DR.SHERRILL'S DESK. 

## 2013-06-26 MED ORDER — NILOTINIB HCL 150 MG PO CAPS: 300.0000 mg | ORAL_CAPSULE | Freq: Every day | ORAL | Status: DC

## 2013-06-26 NOTE — Addendum Note (Signed)
Addended by: Tania Ade on: 06/26/2013 12:15 PM   Modules accepted: Orders

## 2013-07-02 ENCOUNTER — Other Ambulatory Visit: Payer: Self-pay | Admitting: Oncology

## 2013-07-02 NOTE — Telephone Encounter (Signed)
Fax from [facility] that Tasigna was shipped on 06/28/13 for next day delivery.

## 2013-07-18 ENCOUNTER — Encounter (INDEPENDENT_AMBULATORY_CARE_PROVIDER_SITE_OTHER): Payer: Self-pay

## 2013-07-18 ENCOUNTER — Ambulatory Visit
Admission: RE | Admit: 2013-07-18 | Discharge: 2013-07-18 | Disposition: A | Discharge status: Home / Self Care | Payer: Medicare Other | Source: Ambulatory Visit

## 2013-07-18 DIAGNOSIS — Z1231 Encounter for screening mammogram for malignant neoplasm of breast: Secondary | ICD-10-CM

## 2013-07-29 ENCOUNTER — Encounter: Payer: Self-pay | Admitting: *Deleted

## 2013-07-29 NOTE — Progress Notes (Signed)
RECEIVED A FAX FROM [facility] CONCERNING A CONFIRMATION OF PRESCRIPTION SHIPMENT FOR [place] ON 07/26/13.

## 2013-08-20 ENCOUNTER — Telehealth: Payer: Self-pay | Admitting: Nurse Practitioner

## 2013-08-20 ENCOUNTER — Ambulatory Visit (HOSPITAL_BASED_OUTPATIENT_CLINIC_OR_DEPARTMENT_OTHER): Payer: Medicare Other | Admitting: Nurse Practitioner

## 2013-08-20 ENCOUNTER — Other Ambulatory Visit (HOSPITAL_BASED_OUTPATIENT_CLINIC_OR_DEPARTMENT_OTHER): Payer: Medicare Other

## 2013-08-20 VITALS — BP 155/78 | HR 68 | Temp 98.1°F | Resp 18 | Ht 63.0 in | Wt 123.3 lb

## 2013-08-20 DIAGNOSIS — C9211 Chronic myeloid leukemia, BCR/ABL-positive, in remission: Secondary | ICD-10-CM

## 2013-08-20 DIAGNOSIS — C9210 Chronic myeloid leukemia, BCR/ABL-positive, not having achieved remission: Secondary | ICD-10-CM

## 2013-08-20 LAB — COMPREHENSIVE METABOLIC PANEL (CC13)
ALT: 15 U/L (ref 0–55)
AST: 16 U/L (ref 5–34)
Albumin: 4 g/dL (ref 3.5–5.0)
Alkaline Phosphatase: 31 U/L — ABNORMAL LOW (ref 40–150)
Anion Gap: 11 mEq/L (ref 3–11)
BUN: 19.8 mg/dL (ref 7.0–26.0)
CO2: 26 mEq/L (ref 22–29)
Calcium: 9.7 mg/dL (ref 8.4–10.4)
Chloride: 104 mEq/L (ref 98–109)
Creatinine: 1.1 mg/dL (ref 0.6–1.1)
Glucose: 117 mg/dl (ref 70–140)
Potassium: 4.1 mEq/L (ref 3.5–5.1)
Sodium: 141 mEq/L (ref 136–145)
Total Bilirubin: 1.13 mg/dL (ref 0.20–1.20)
Total Protein: 6.8 g/dL (ref 6.4–8.3)

## 2013-08-20 LAB — CBC WITH DIFFERENTIAL/PLATELET
BASO%: 0.5 % (ref 0.0–2.0)
Basophils Absolute: 0 10*3/uL (ref 0.0–0.1)
EOS%: 4.8 % (ref 0.0–7.0)
Eosinophils Absolute: 0.3 10*3/uL (ref 0.0–0.5)
HCT: 39.1 % (ref 34.8–46.6)
HGB: 13.5 g/dL (ref 11.6–15.9)
LYMPH%: 21.5 % (ref 14.0–49.7)
MCH: 30.7 pg (ref 25.1–34.0)
MCHC: 34.5 g/dL (ref 31.5–36.0)
MCV: 88.9 fL (ref 79.5–101.0)
MONO#: 0.5 10*3/uL (ref 0.1–0.9)
MONO%: 7.8 % (ref 0.0–14.0)
NEUT#: 4 10*3/uL (ref 1.5–6.5)
NEUT%: 65.4 % (ref 38.4–76.8)
Platelets: 200 10*3/uL (ref 145–400)
RBC: 4.4 10*6/uL (ref 3.70–5.45)
RDW: 12.7 % (ref 11.2–14.5)
WBC: 6.1 10*3/uL (ref 3.9–10.3)
lymph#: 1.3 10*3/uL (ref 0.9–3.3)

## 2013-08-20 LAB — MAGNESIUM (CC13): Magnesium: 2.1 mg/dl (ref 1.5–2.5)

## 2013-08-20 NOTE — Telephone Encounter (Signed)
Pt confirmed labs/ov per 07/14 POF, gave pt AVS, per POF pt to see GBS, Dr. did not have availability during 25mth f/u.Marland Kitchen..KJ

## 2013-08-20 NOTE — Progress Notes (Signed)
  [place] OFFICE PROGRESS NOTE   Diagnosis:  CML.  INTERVAL HISTORY:   [name] returns as scheduled. She continues nilotinib. She overall feels well. She has occasional mild nausea. No vomiting. No mouth sores. She has periodic loose stools. She reports a good appetite. Her energy is "good". No shortness of breath, cough or chest pain. She continues to have "itchy watery eyes".  Objective:  Vital signs in last 24 hours:  Blood pressure 155/78, pulse 68, temperature 98.1 F (36.7 C), temperature source Oral, resp. rate 18, height 5\' 3"  (1.6 m), weight 123 lb 4.8 oz (55.929 kg), SpO2 100.00%.    HEENT: Mild conjunctival erythema. No thrush or ulcerations. Resp: Lungs clear. Cardio: Regular cardiac rhythm. GI: Abdomen soft and nontender. No organomegaly. Vascular: No leg edema. Calves soft and nontender.   Lab Results:  Lab Results  Component Value Date   WBC 6.1 08/20/2013   HGB 13.5 08/20/2013   HCT 39.1 08/20/2013   MCV 88.9 08/20/2013   PLT 200 08/20/2013   NEUTROABS 4.0 08/20/2013   05/21/2013 BCR/ABL 0.14%.  Imaging:  No results found.  Medications: I have reviewed the patient's current medications.  Assessment/Plan: 1. Chronic myelogenous leukemia -- initially treated with hydroxyurea. She began [place] on 08/27/2009. Gleevec was placed on hold on 10/06/2009 due to poor tolerance of the 400 mg daily dose. Gleevec was restarted when she had hematologic improvement on 10/13/2009 and the dose was changed to 300 mg on 10/26/2009. The Gleevec dose was reduced to 200 mg daily due to excessive "tearing". The peripheral blood BCR/ABL returned at 16% on 04/19/2010. Gleevec was discontinued secondary to toxicity and a persistent elevation of the peripheral blood translocation . She began Nilotinib on 06/09/2011. Nilotinib placed on hold beginning 08/04/2011 due to fatigue/dyspnea on exertion and slight QT prolongation. Nilotinib was resumed on 08/15/2011 at a dose  of 300 mg daily. Peripheral blood PCR was negative on 12/27/2011 and 05/14/2012. Mild elevation of the peripheral blood PCR on 10/02/2012. Improved on repeat peripheral blood PCR 01/01/2013. Stable 02/21/2013. Stable 05/21/2013. 2. Diarrhea. Resolved since discontinuing Gleevec. 3. Periorbital edema -improved since stopping Gleevec. 4. Increased "tearing"-improved with discontinuance of Gleevec.  5. History of a fine papular rash over the trunk and legs-likely secondary to the nilotinib.  6. Mild QT prolongation and multifocal atrial pacemaker on EKG 08/04/2011. QT normal on EKG 05/21/2013. 7. History of mild hypokalemia -maintained on oral potassium replacement. Potassium in normal range on labs today.   Disposition: She appears stable. She remains in hematologic remission from CML. We will followup on the peripheral blood PCR from today. She will continue Nilotinib.  She continues to have eye pruritus. She has an appointment with her eye doctor in the near future.  She will return for a followup visit and labs in 3 months. She understands to contact the office in the interim with any problems.  Plan reviewed with Dr. Benay Spice.    [name] ANP/GNP-BC   08/20/2013  9:40 AM

## 2013-08-27 ENCOUNTER — Encounter: Payer: Self-pay | Admitting: *Deleted

## 2013-08-27 NOTE — Progress Notes (Signed)
RECEIVED A FAX FROM [facility] CONCERNING A CONFIRMATION OF PRESCRIPTION SHIPMENT FOR [place] ON 08/26/13.

## 2013-09-20 ENCOUNTER — Other Ambulatory Visit: Payer: Self-pay | Admitting: *Deleted

## 2013-09-20 NOTE — Telephone Encounter (Signed)
THIS REFILL REQUEST FOR TASIGNA WAS GIVEN TO DR.SHERRILL'S NURSE, SUSAN COWARD,RN.

## 2013-09-24 ENCOUNTER — Other Ambulatory Visit: Payer: Self-pay | Admitting: *Deleted

## 2013-09-24 DIAGNOSIS — C9210 Chronic myeloid leukemia, BCR/ABL-positive, not having achieved remission: Secondary | ICD-10-CM

## 2013-09-24 MED ORDER — NILOTINIB HCL 150 MG PO CAPS: 300.0000 mg | ORAL_CAPSULE | Freq: Every day | ORAL | Status: DC

## 2013-09-24 NOTE — Telephone Encounter (Signed)
RECEIVED A FAX FROM [facility] CONCERNING A CONFIRMATION OF FACSIMILE RECEIPT FOR PT. REFERRAL. 

## 2013-10-18 ENCOUNTER — Other Ambulatory Visit: Payer: Self-pay | Admitting: *Deleted

## 2013-10-18 DIAGNOSIS — C9210 Chronic myeloid leukemia, BCR/ABL-positive, not having achieved remission: Secondary | ICD-10-CM

## 2013-10-18 MED ORDER — NILOTINIB HCL 150 MG PO CAPS: 300.0000 mg | ORAL_CAPSULE | Freq: Every day | ORAL | Status: DC

## 2013-10-18 NOTE — Telephone Encounter (Signed)
THIS REFILL REQUEST WAS PLACED ON DR.SHERRILL'S DESK.

## 2013-10-18 NOTE — Addendum Note (Signed)
Addended by: Wyonia Hough on: 10/18/2013 03:14 PM   Modules accepted: Orders

## 2013-11-18 ENCOUNTER — Other Ambulatory Visit: Payer: Self-pay | Admitting: *Deleted

## 2013-11-18 DIAGNOSIS — C9210 Chronic myeloid leukemia, BCR/ABL-positive, not having achieved remission: Secondary | ICD-10-CM

## 2013-11-18 MED ORDER — NILOTINIB HCL 150 MG PO CAPS: 300.0000 mg | ORAL_CAPSULE | Freq: Every day | ORAL | Status: DC

## 2013-11-19 ENCOUNTER — Other Ambulatory Visit (HOSPITAL_BASED_OUTPATIENT_CLINIC_OR_DEPARTMENT_OTHER): Payer: Medicare Other

## 2013-11-19 ENCOUNTER — Ambulatory Visit (HOSPITAL_BASED_OUTPATIENT_CLINIC_OR_DEPARTMENT_OTHER): Payer: Medicare Other | Admitting: Nurse Practitioner

## 2013-11-19 ENCOUNTER — Telehealth: Payer: Self-pay | Admitting: Oncology

## 2013-11-19 VITALS — BP 132/65 | HR 63 | Temp 98.1°F | Resp 20 | Ht 63.0 in | Wt 123.2 lb

## 2013-11-19 DIAGNOSIS — C9210 Chronic myeloid leukemia, BCR/ABL-positive, not having achieved remission: Secondary | ICD-10-CM

## 2013-11-19 DIAGNOSIS — C9211 Chronic myeloid leukemia, BCR/ABL-positive, in remission: Secondary | ICD-10-CM

## 2013-11-19 DIAGNOSIS — R197 Diarrhea, unspecified: Secondary | ICD-10-CM

## 2013-11-19 DIAGNOSIS — Z23 Encounter for immunization: Secondary | ICD-10-CM

## 2013-11-19 LAB — CBC WITH DIFFERENTIAL/PLATELET
BASO%: 0.6 % (ref 0.0–2.0)
Basophils Absolute: 0 10*3/uL (ref 0.0–0.1)
EOS%: 4.1 % (ref 0.0–7.0)
Eosinophils Absolute: 0.3 10*3/uL (ref 0.0–0.5)
HCT: 40.5 % (ref 34.8–46.6)
HGB: 14 g/dL (ref 11.6–15.9)
LYMPH%: 24.3 % (ref 14.0–49.7)
MCH: 31.2 pg (ref 25.1–34.0)
MCHC: 34.6 g/dL (ref 31.5–36.0)
MCV: 90.2 fL (ref 79.5–101.0)
MONO#: 0.6 10*3/uL (ref 0.1–0.9)
MONO%: 9.4 % (ref 0.0–14.0)
NEUT#: 4 10*3/uL (ref 1.5–6.5)
NEUT%: 61.6 % (ref 38.4–76.8)
Platelets: 220 10*3/uL (ref 145–400)
RBC: 4.49 10*6/uL (ref 3.70–5.45)
RDW: 13 % (ref 11.2–14.5)
WBC: 6.5 10*3/uL (ref 3.9–10.3)
lymph#: 1.6 10*3/uL (ref 0.9–3.3)

## 2013-11-19 LAB — COMPREHENSIVE METABOLIC PANEL (CC13)
ALT: 19 U/L (ref 0–55)
AST: 16 U/L (ref 5–34)
Albumin: 3.8 g/dL (ref 3.5–5.0)
Alkaline Phosphatase: 36 U/L — ABNORMAL LOW (ref 40–150)
Anion Gap: 9 mEq/L (ref 3–11)
BUN: 14.4 mg/dL (ref 7.0–26.0)
CO2: 25 mEq/L (ref 22–29)
Calcium: 10.1 mg/dL (ref 8.4–10.4)
Chloride: 106 mEq/L (ref 98–109)
Creatinine: 1.1 mg/dL (ref 0.6–1.1)
Glucose: 109 mg/dl (ref 70–140)
Potassium: 4.4 mEq/L (ref 3.5–5.1)
Sodium: 141 mEq/L (ref 136–145)
Total Bilirubin: 0.87 mg/dL (ref 0.20–1.20)
Total Protein: 6.8 g/dL (ref 6.4–8.3)

## 2013-11-19 LAB — MAGNESIUM (CC13): Magnesium: 2.2 mg/dl (ref 1.5–2.5)

## 2013-11-19 MED ORDER — INFLUENZA VAC SPLIT QUAD 0.5 ML IM SUSY
0.5000 mL | PREFILLED_SYRINGE | Freq: Once | INTRAMUSCULAR | Status: AC
  Administered 2013-11-19: 0.5 mL via INTRAMUSCULAR
  Filled 2013-11-19: qty 0.5

## 2013-11-19 NOTE — Progress Notes (Signed)
  [place] OFFICE PROGRESS NOTE   Diagnosis:  CML  INTERVAL HISTORY:   [name] returns as scheduled. She continues Nilotinib. She feels well. No interim illnesses or infections. No fever. She denies cough and shortness of breath. She has occasional mild nausea. No mouth sores. No diarrhea. Eyes continue to be "watery". She has seen an eye doctor with no specific recommendations. She has a good appetite and good energy level. No skin rash. No leg swelling.  Objective:  Vital signs in last 24 hours:  Blood pressure 132/65, pulse 63, temperature 98.1 F (36.7 C), temperature source Oral, resp. rate 20, height 5\' 3"  (1.6 m), weight 123 lb 3.2 oz (55.883 kg), SpO2 98.00%.    HEENT: No thrush or ulcers. Mild conjunctival erythema. Lymphatics: No palpable cervical or supraclavicular lymph nodes. Resp: Lungs clear bilaterally. Cardio: Regular rate and rhythm. GI: Abdomen soft and nontender. No organomegaly. Vascular: No leg edema. Calves soft and nontender. Skin: No rash.    Lab Results:  Lab Results  Component Value Date   WBC 6.5 11/19/2013   HGB 14.0 11/19/2013   HCT 40.5 11/19/2013   MCV 90.2 11/19/2013   PLT 220 11/19/2013   NEUTROABS 4.0 11/19/2013    Imaging:  No results found.  Medications: I have reviewed the patient's current medications.  Assessment/Plan: 1. Chronic myelogenous leukemia -- initially treated with hydroxyurea. She began [place] on 08/27/2009. Gleevec was placed on hold on 10/06/2009 due to poor tolerance of the 400 mg daily dose. Gleevec was restarted when she had hematologic improvement on 10/13/2009 and the dose was changed to 300 mg on 10/26/2009. The Gleevec dose was reduced to 200 mg daily due to excessive "tearing". The peripheral blood BCR/ABL returned at 16% on 04/19/2010. Gleevec was discontinued secondary to toxicity and a persistent elevation of the peripheral blood translocation . She began Nilotinib on 06/09/2011.  Nilotinib placed on hold beginning 08/04/2011 due to fatigue/dyspnea on exertion and slight QT prolongation. Nilotinib was resumed on 08/15/2011 at a dose of 300 mg daily. Peripheral blood PCR was negative on 12/27/2011 and 05/14/2012. Mild elevation of the peripheral blood PCR on 10/02/2012. Improved on repeat peripheral blood PCR 01/01/2013. Stable 02/21/2013. Stable 05/21/2013. 2. Diarrhea. Resolved since discontinuing Gleevec. 3. Periorbital edema -improved since stopping Gleevec. 4. Increased "tearing"-improved with discontinuance of Gleevec. She continues to have mild tearing. 5. History of a fine papular rash over the trunk and legs-likely secondary to the nilotinib.  6. Mild QT prolongation and multifocal atrial pacemaker on EKG 08/04/2011. QT normal on EKG 05/21/2013. 7. History of mild hypokalemia -maintained on oral potassium replacement. Potassium in normal range on labs today.   Disposition: [name] appears stable. She remains in hematologic remission from CML. She will continue Nilotinib. We will followup on the peripheral blood PCR from today.  She will return for a followup visit and labs in 3 months. She will contact the office in the interim with any problems.  The influenza vaccine was administered at today's visit.  Plan reviewed with Dr. Benay Spice.    [name] ANP/GNP-BC   11/19/2013  9:49 AM

## 2013-11-19 NOTE — Telephone Encounter (Signed)
, °

## 2013-11-20 ENCOUNTER — Other Ambulatory Visit: Payer: Self-pay | Admitting: *Deleted

## 2013-11-20 ENCOUNTER — Telehealth: Payer: Self-pay | Admitting: *Deleted

## 2013-11-20 DIAGNOSIS — C9210 Chronic myeloid leukemia, BCR/ABL-positive, not having achieved remission: Secondary | ICD-10-CM

## 2013-11-20 MED ORDER — NILOTINIB HCL 150 MG PO CAPS: 300.0000 mg | ORAL_CAPSULE | Freq: Every day | ORAL | Status: DC

## 2013-11-20 NOTE — Telephone Encounter (Signed)
[facility] faxed confirmation of facsimile receipt for Tasigna prescription referral.  [facility] will verify insurance and make delivery arrangements with patient.

## 2013-11-20 NOTE — Telephone Encounter (Signed)
Refaxed [place] script to [facility]-they have no record of receiving the refill on 11/18/13 from office.

## 2013-12-23 ENCOUNTER — Telehealth: Payer: Self-pay | Admitting: Oncology

## 2013-12-23 NOTE — Telephone Encounter (Signed)
Faxed pt medical records to [facility] [phone]

## 2013-12-31 ENCOUNTER — Other Ambulatory Visit: Payer: Self-pay | Admitting: *Deleted

## 2013-12-31 DIAGNOSIS — C9210 Chronic myeloid leukemia, BCR/ABL-positive, not having achieved remission: Secondary | ICD-10-CM

## 2013-12-31 MED ORDER — NILOTINIB HCL 150 MG PO CAPS: 300.0000 mg | ORAL_CAPSULE | Freq: Every day | ORAL | Status: DC

## 2013-12-31 NOTE — Telephone Encounter (Signed)
RECEIVED A FAX FROM [facility] CONCERNING A CONFIRMATION OF FACSIMILE RECEIPT FOR PT. REFERRAL. 

## 2013-12-31 NOTE — Telephone Encounter (Signed)
Pt called requesting to change Tasigna from [facility] back to [facility] "I have to go all the way across town to pick it up"  Per Dr. Benay Spice; notified pt that new script will be sent to [facility].  Pt verbalized understanding and expressed appreciation.

## 2014-01-16 ENCOUNTER — Telehealth: Payer: Self-pay

## 2014-01-16 NOTE — Telephone Encounter (Signed)
Received confirmation of shipment from [facility] of tasigna. Ship date 12//9/15.

## 2014-01-28 ENCOUNTER — Encounter: Payer: Self-pay | Admitting: Oncology

## 2014-02-05 IMAGING — CR DG CHEST 2V
2 series · 2 of 2 positions shown · non-contrast
Comparison: 11/05/2007.

CLINICAL DATA: Cough and shortness of breath.

CHEST - 2 VIEW

[w chest pa]
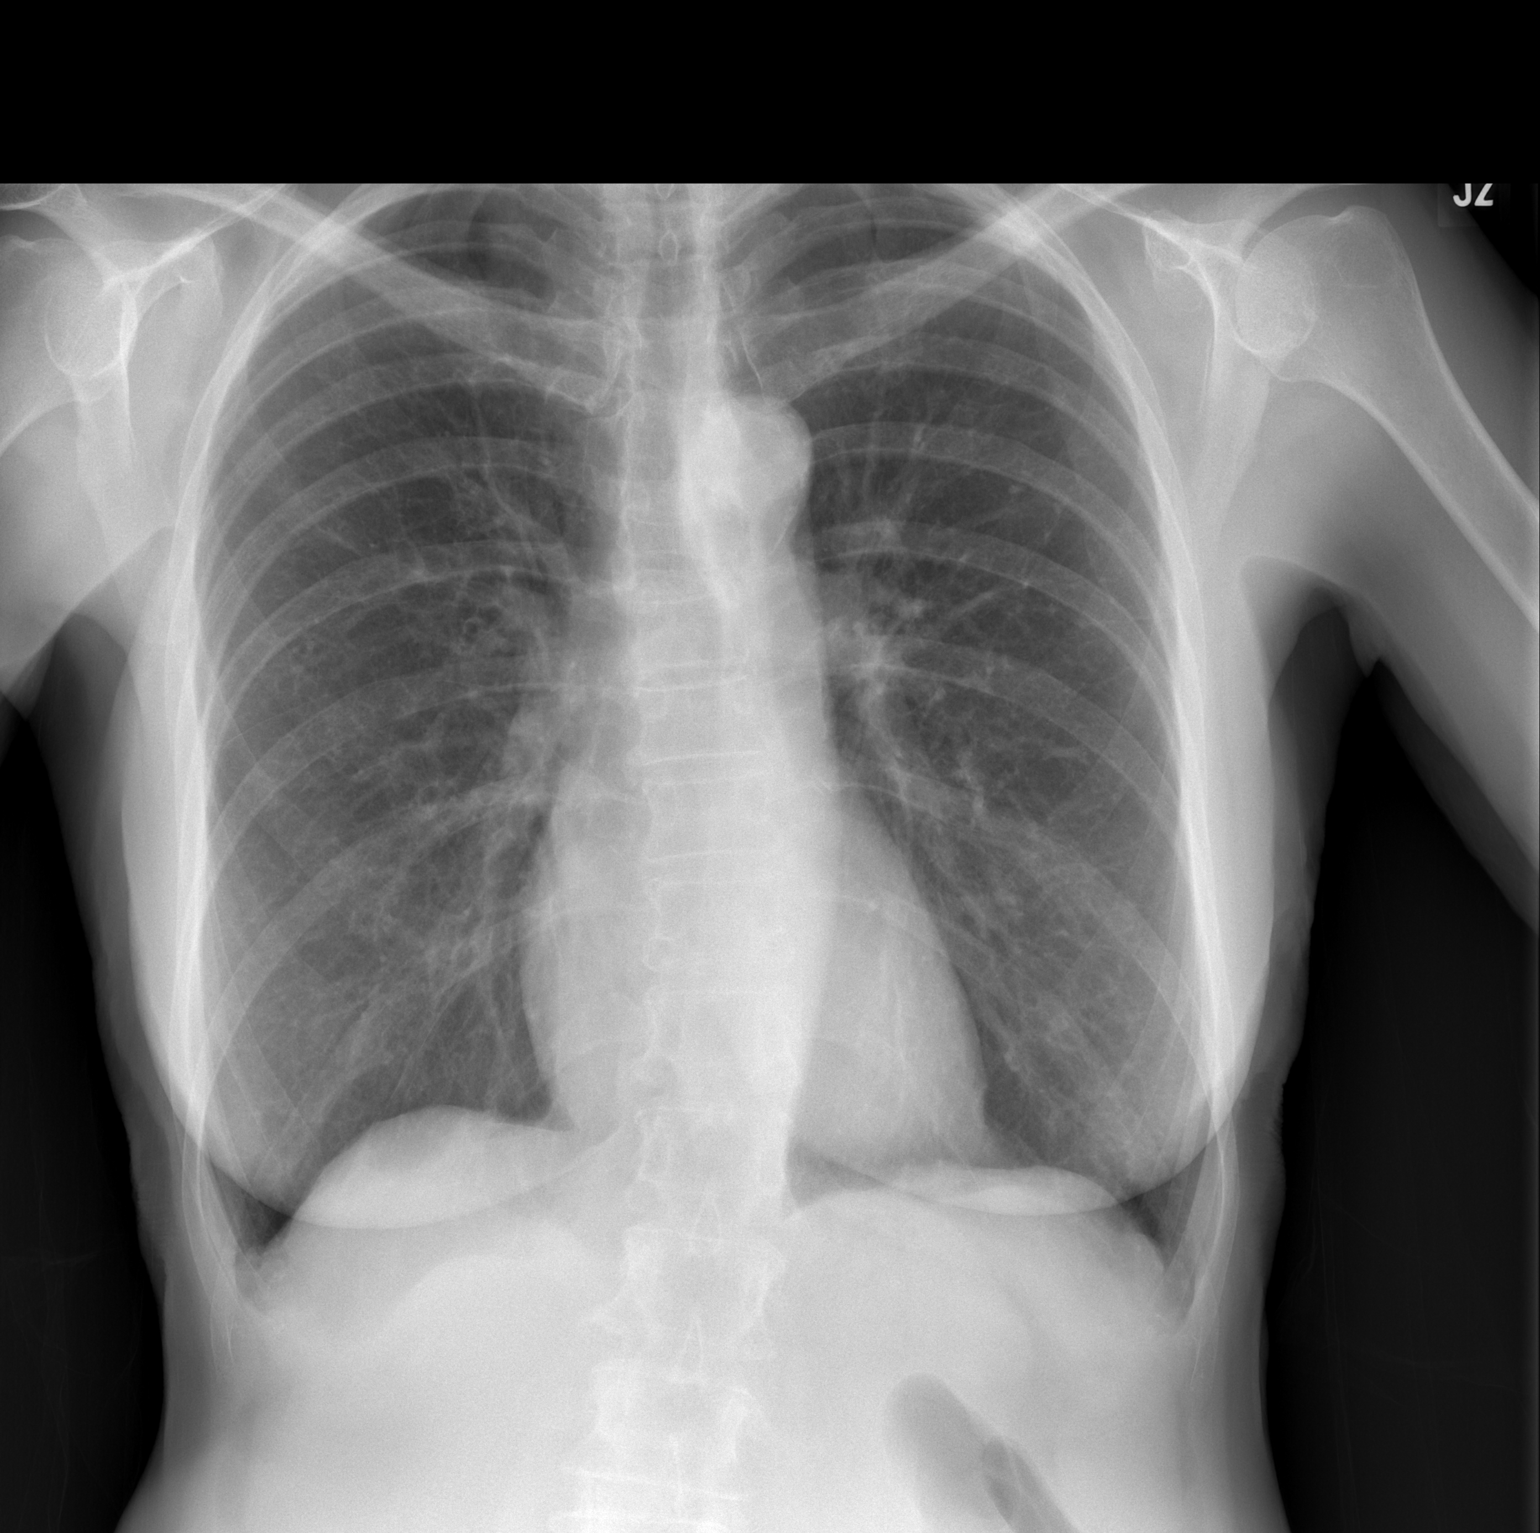

[w chest lat]
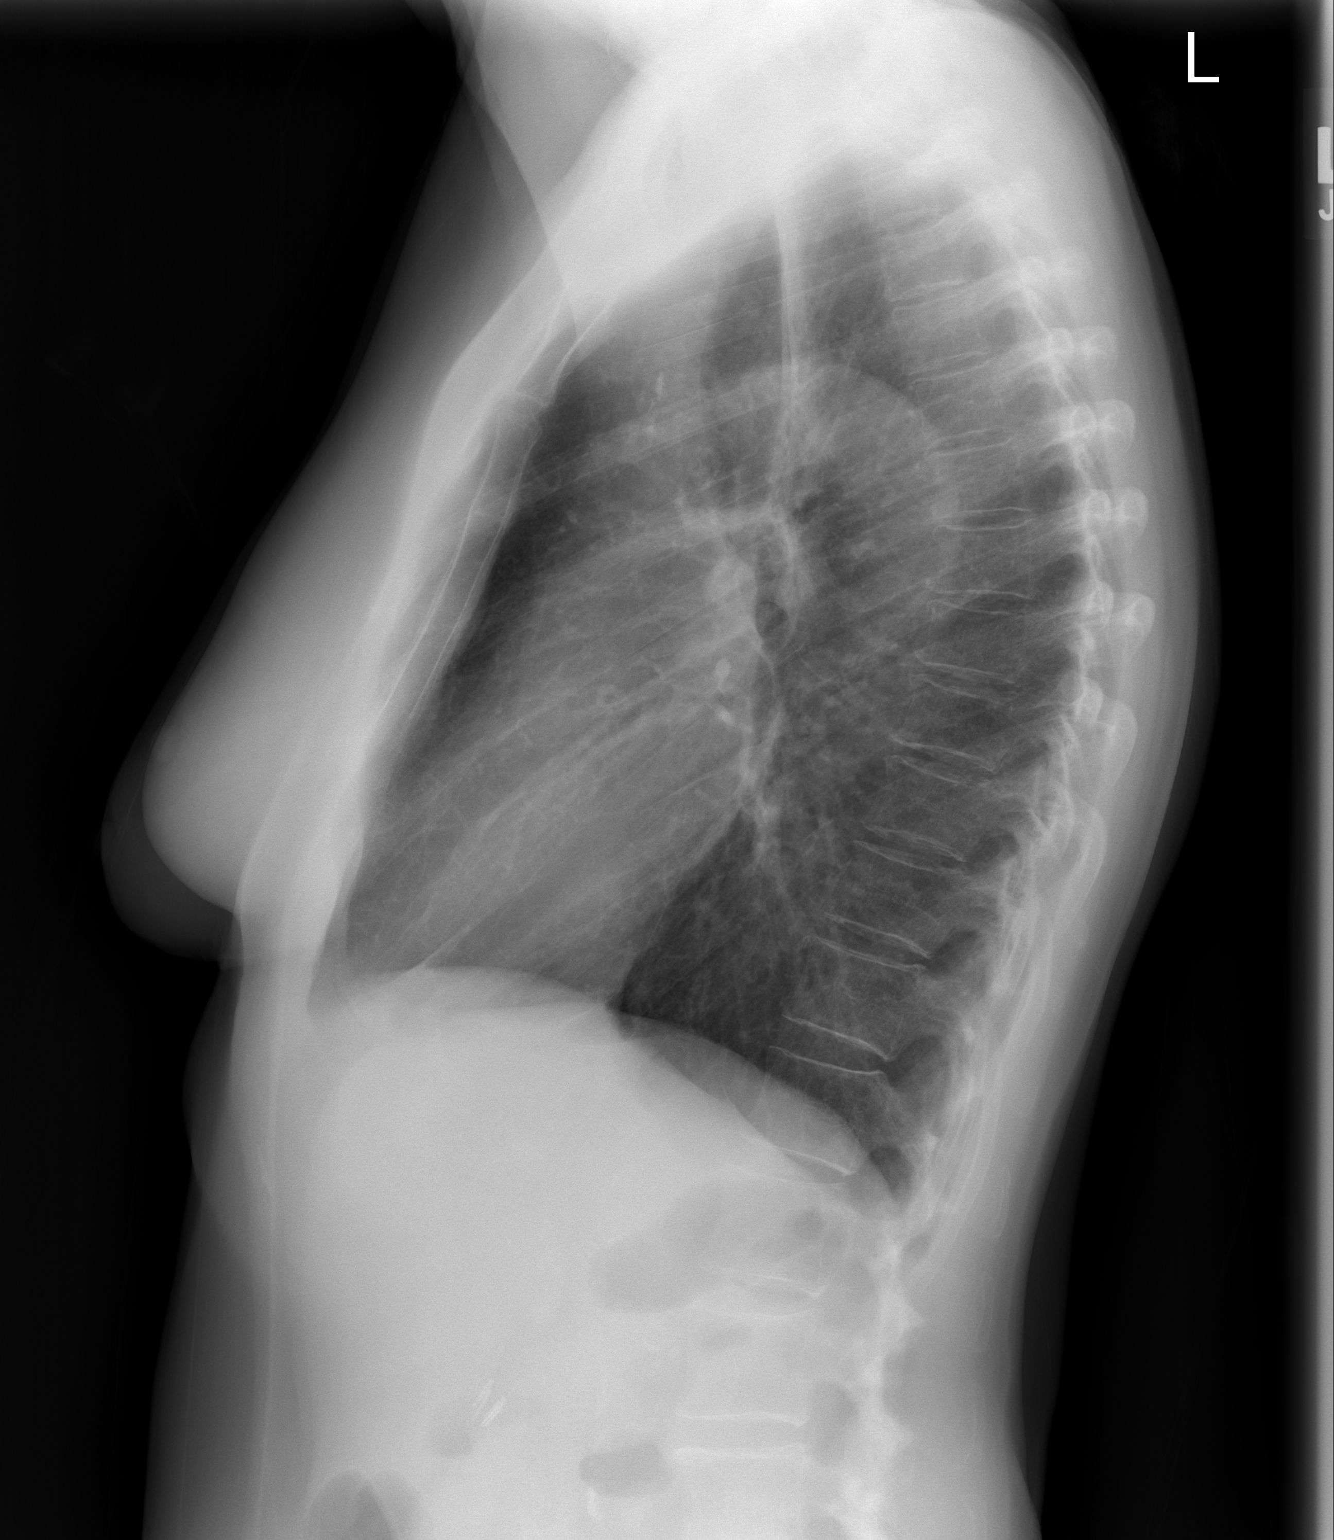

[2 of 2 positions shown; findings below may reference images not displayed]

FINDINGS: Question mild upper tracheal narrowing.  Heart size
normal.  Thoracic aorta is calcified.  Lungs are clear.  No pleural
fluid.  Pectus deformity.
IMPRESSION: 1.  No acute findings.
2.  Upper trachea appears narrowed, which can be seen with thyroid
enlargement.  Please correlate clinically.

## 2014-02-25 ENCOUNTER — Ambulatory Visit (HOSPITAL_BASED_OUTPATIENT_CLINIC_OR_DEPARTMENT_OTHER): Payer: Medicare Other | Admitting: Oncology

## 2014-02-25 ENCOUNTER — Other Ambulatory Visit: Payer: Self-pay

## 2014-02-25 ENCOUNTER — Telehealth: Payer: Self-pay | Admitting: Oncology

## 2014-02-25 ENCOUNTER — Other Ambulatory Visit (HOSPITAL_BASED_OUTPATIENT_CLINIC_OR_DEPARTMENT_OTHER): Payer: Medicare Other

## 2014-02-25 VITALS — BP 165/71 | HR 77 | Temp 97.8°F | Resp 18 | Ht 63.0 in | Wt 123.9 lb

## 2014-02-25 DIAGNOSIS — C9211 Chronic myeloid leukemia, BCR/ABL-positive, in remission: Secondary | ICD-10-CM

## 2014-02-25 DIAGNOSIS — C9210 Chronic myeloid leukemia, BCR/ABL-positive, not having achieved remission: Secondary | ICD-10-CM

## 2014-02-25 LAB — COMPREHENSIVE METABOLIC PANEL (CC13)
ALT: 13 U/L (ref 0–55)
AST: 15 U/L (ref 5–34)
Albumin: 4 g/dL (ref 3.5–5.0)
Alkaline Phosphatase: 36 U/L — ABNORMAL LOW (ref 40–150)
Anion Gap: 8 mEq/L (ref 3–11)
BUN: 16 mg/dL (ref 7.0–26.0)
CO2: 26 mEq/L (ref 22–29)
Calcium: 9.2 mg/dL (ref 8.4–10.4)
Chloride: 106 mEq/L (ref 98–109)
Creatinine: 1 mg/dL (ref 0.6–1.1)
EGFR: 51 mL/min/{1.73_m2} — ABNORMAL LOW (ref >90)
Glucose: 95 mg/dl (ref 70–140)
Potassium: 3.6 mEq/L (ref 3.5–5.1)
Sodium: 140 mEq/L (ref 136–145)
Total Bilirubin: 0.88 mg/dL (ref 0.20–1.20)
Total Protein: 6.6 g/dL (ref 6.4–8.3)

## 2014-02-25 LAB — CBC WITH DIFFERENTIAL/PLATELET
BASO%: 1.4 % (ref 0.0–2.0)
Basophils Absolute: 0.1 10*3/uL (ref 0.0–0.1)
EOS%: 4.7 % (ref 0.0–7.0)
Eosinophils Absolute: 0.3 10*3/uL (ref 0.0–0.5)
HCT: 42.4 % (ref 34.8–46.6)
HGB: 13.8 g/dL (ref 11.6–15.9)
LYMPH%: 25.8 % (ref 14.0–49.7)
MCH: 30.4 pg (ref 25.1–34.0)
MCHC: 32.6 g/dL (ref 31.5–36.0)
MCV: 93.4 fL (ref 79.5–101.0)
MONO#: 0.6 10*3/uL (ref 0.1–0.9)
MONO%: 9.6 % (ref 0.0–14.0)
NEUT#: 3.6 10*3/uL (ref 1.5–6.5)
NEUT%: 58.5 % (ref 38.4–76.8)
Platelets: 246 10*3/uL (ref 145–400)
RBC: 4.54 10*6/uL (ref 3.70–5.45)
RDW: 12.1 % (ref 11.2–14.5)
WBC: 6.2 10*3/uL (ref 3.9–10.3)
lymph#: 1.6 10*3/uL (ref 0.9–3.3)

## 2014-02-25 LAB — MAGNESIUM (CC13): Magnesium: 2.4 mg/dl (ref 1.5–2.5)

## 2014-02-25 NOTE — Progress Notes (Signed)
  [name]   Diagnosis: CML  INTERVAL HISTORY:   [name] returns as scheduled. She continues nilotinib. She has a loose stool once daily. No complaint.   Objective:  Vital signs in last 24 hours:  Blood pressure 165/71, pulse 77, temperature 97.8 F (36.6 C), temperature source Oral, resp. rate 18, height 5\' 3"  (1.6 m), weight 123 lb 14.4 oz (56.201 kg), SpO2 100 %.    HEENT: No thrush or ulcers Resp: Lungs Clear bilaterally  Cardio: Regular rate and rhythm GI: No hepatosplenomegaly Vascular: No leg edema    Lab Results:  Lab Results  Component Value Date   WBC 6.2 02/25/2014   HGB 13.8 02/25/2014   HCT 42.4 02/25/2014   MCV 93.4 02/25/2014   PLT 246 02/25/2014   NEUTROABS 3.6 02/25/2014   potassium 3.6, creatinine 1.0, magnesium 2.4  EKG: Sinus bradycardia, QT 436, QTc 424 Medications: I have reviewed the patient's current medications.  Assessment/Plan: 1. Chronic myelogenous leukemia -- initially treated with hydroxyurea. She began [place] on 08/27/2009. Gleevec was placed on hold on 10/06/2009 due to poor tolerance of the 400 mg daily dose. Gleevec was restarted when she had hematologic improvement on 10/13/2009 and the dose was changed to 300 mg on 10/26/2009. The Gleevec dose was reduced to 200 mg daily due to excessive "tearing". The peripheral blood BCR/ABL returned at 16% on 04/19/2010. Gleevec was discontinued secondary to toxicity and a persistent elevation of the peripheral blood translocation . She began Nilotinib on 06/09/2011. Nilotinib placed on hold beginning 08/04/2011 due to fatigue/dyspnea on exertion and slight QT prolongation. Nilotinib was resumed on 08/15/2011 at a dose of 300 mg daily. Peripheral blood PCR was negative on 12/27/2011 and 05/14/2012. Mild elevation of the peripheral blood PCR on 10/02/2012. Improved on repeat peripheral blood PCR 01/01/2013. Stable 02/21/2013. Stable 05/21/2013. Stable  11/22/2013. 2. Diarrhea. Resolved since discontinuing Gleevec. 3. Periorbital edema -improved since stopping Gleevec. 4. Increased "tearing"-improved with discontinuance of Gleevec. She continues to have mild tearing. 5. History of a fine papular rash over the trunk and legs-likely secondary to the nilotinib.  6. Mild QT prolongation and multifocal atrial pacemaker on EKG 08/04/2011. QT normal on EKG 02/25/2014. 7. History of mild hypokalemia -maintained on oral potassium replacement. Potassium in normal range on labs today.     Disposition:  She remains in hematologic remission from CML. We will follow-up on the peripheral blood PCR from today. She will continue Nilotinib at the current dose. She will return for an office and lab visit in 3 months.  Betsy Coder, MD  02/25/2014  10:32 AM

## 2014-02-25 NOTE — Telephone Encounter (Signed)
Pt confirmed labs/ov per 01/19 POF, gave pt AVS.... KJ °

## 2014-04-08 ENCOUNTER — Other Ambulatory Visit: Payer: Self-pay | Admitting: *Deleted

## 2014-04-08 DIAGNOSIS — C9210 Chronic myeloid leukemia, BCR/ABL-positive, not having achieved remission: Secondary | ICD-10-CM

## 2014-04-08 MED ORDER — NILOTINIB HCL 150 MG PO CAPS: 300.0000 mg | ORAL_CAPSULE | Freq: Every day | ORAL | Status: DC

## 2014-04-08 NOTE — Telephone Encounter (Signed)
RECEIVED A FAX FROM [facility] CONCERNING A CONFIRMATION OF FACSIMILE RECEIPT FOR PT. REFERRAL. 

## 2014-05-27 ENCOUNTER — Ambulatory Visit (HOSPITAL_BASED_OUTPATIENT_CLINIC_OR_DEPARTMENT_OTHER): Payer: Medicare Other | Admitting: Nurse Practitioner

## 2014-05-27 ENCOUNTER — Telehealth: Payer: Self-pay | Admitting: Oncology

## 2014-05-27 ENCOUNTER — Other Ambulatory Visit (HOSPITAL_BASED_OUTPATIENT_CLINIC_OR_DEPARTMENT_OTHER): Payer: Medicare Other

## 2014-05-27 VITALS — BP 168/71 | HR 63 | Temp 97.7°F | Resp 19 | Ht 63.0 in | Wt 123.7 lb

## 2014-05-27 DIAGNOSIS — C9210 Chronic myeloid leukemia, BCR/ABL-positive, not having achieved remission: Secondary | ICD-10-CM

## 2014-05-27 DIAGNOSIS — C9211 Chronic myeloid leukemia, BCR/ABL-positive, in remission: Secondary | ICD-10-CM

## 2014-05-27 LAB — CBC WITH DIFFERENTIAL/PLATELET
BASO%: 1 % (ref 0.0–2.0)
Basophils Absolute: 0.1 10*3/uL (ref 0.0–0.1)
EOS%: 5 % (ref 0.0–7.0)
Eosinophils Absolute: 0.3 10*3/uL (ref 0.0–0.5)
HCT: 41.2 % (ref 34.8–46.6)
HGB: 13.8 g/dL (ref 11.6–15.9)
LYMPH%: 26.4 % (ref 14.0–49.7)
MCH: 30.8 pg (ref 25.1–34.0)
MCHC: 33.5 g/dL (ref 31.5–36.0)
MCV: 91.9 fL (ref 79.5–101.0)
MONO#: 0.5 10*3/uL (ref 0.1–0.9)
MONO%: 8.4 % (ref 0.0–14.0)
NEUT#: 3.5 10*3/uL (ref 1.5–6.5)
NEUT%: 59.2 % (ref 38.4–76.8)
Platelets: 244 10*3/uL (ref 145–400)
RBC: 4.48 10*6/uL (ref 3.70–5.45)
RDW: 12.8 % (ref 11.2–14.5)
WBC: 6 10*3/uL (ref 3.9–10.3)
lymph#: 1.6 10*3/uL (ref 0.9–3.3)

## 2014-05-27 LAB — COMPREHENSIVE METABOLIC PANEL (CC13)
ALT: 16 U/L (ref 0–55)
AST: 17 U/L (ref 5–34)
Albumin: 4 g/dL (ref 3.5–5.0)
Alkaline Phosphatase: 36 U/L — ABNORMAL LOW (ref 40–150)
Anion Gap: 12 mEq/L — ABNORMAL HIGH (ref 3–11)
BUN: 19.1 mg/dL (ref 7.0–26.0)
CO2: 23 mEq/L (ref 22–29)
Calcium: 9.4 mg/dL (ref 8.4–10.4)
Chloride: 105 mEq/L (ref 98–109)
Creatinine: 1 mg/dL (ref 0.6–1.1)
EGFR: 55 mL/min/{1.73_m2} — ABNORMAL LOW (ref >90)
Glucose: 107 mg/dl (ref 70–140)
Potassium: 4 mEq/L (ref 3.5–5.1)
Sodium: 141 mEq/L (ref 136–145)
Total Bilirubin: 0.9 mg/dL (ref 0.20–1.20)
Total Protein: 6.8 g/dL (ref 6.4–8.3)

## 2014-05-27 NOTE — Telephone Encounter (Signed)
Pt confirmed labs/ov/EKG per 04/19 POF, gave pt AVS and Calendar..... KJ

## 2014-05-27 NOTE — Progress Notes (Signed)
  [facility] OFFICE PROGRESS NOTE   Diagnosis:  CLL  INTERVAL HISTORY:   Ms. Collister returns as scheduled. She has occasional mild nausea. No diarrhea. No skin rash. She reports a good appetite. She denies pain. No fever, cough or shortness of breath.  Objective:  Vital signs in last 24 hours:  Blood pressure 168/71, pulse 63, temperature 97.7 F (36.5 C), temperature source Oral, resp. rate 19, height 5\' 3"  (1.6 m), weight 123 lb 11.2 oz (56.11 kg), SpO2 100 %.    HEENT: No thrush or ulcers. Lymphatics: No palpable cervical, supra clavicular or axillary lymph nodes. Resp: Lungs clear bilaterally. Cardio: Regular rate and rhythm. GI: Abdomen soft and nontender. No organomegaly. Vascular: No leg edema. Calves soft and nontender.  Skin: No rash.    Lab Results:  Lab Results  Component Value Date   WBC 6.0 05/27/2014   HGB 13.8 05/27/2014   HCT 41.2 05/27/2014   MCV 91.9 05/27/2014   PLT 244 05/27/2014   NEUTROABS 3.5 05/27/2014    Imaging:  No results found.  Medications: I have reviewed the patient's current medications.  Assessment/Plan: 1. Chronic myelogenous leukemia -- initially treated with hydroxyurea. She began [place] on 08/27/2009. Gleevec was placed on hold on 10/06/2009 due to poor tolerance of the 400 mg daily dose. Gleevec was restarted when she had hematologic improvement on 10/13/2009 and the dose was changed to 300 mg on 10/26/2009. The Gleevec dose was reduced to 200 mg daily due to excessive "tearing". The peripheral blood BCR/ABL returned at 16% on 04/19/2010. Gleevec was discontinued secondary to toxicity and a persistent elevation of the peripheral blood translocation . She began Nilotinib on 06/09/2011. Nilotinib placed on hold beginning 08/04/2011 due to fatigue/dyspnea on exertion and slight QT prolongation. Nilotinib was resumed on 08/15/2011 at a dose of 300 mg daily. Peripheral blood PCR was negative on 12/27/2011 and 05/14/2012.  Mild elevation of the peripheral blood PCR on 10/02/2012. Improved on repeat peripheral blood PCR 01/01/2013. Stable 02/21/2013. Stable 05/21/2013. Stable 11/22/2013. Stable 02/25/2014. 2. Diarrhea. Resolved since discontinuing Gleevec. 3. Periorbital edema -improved since stopping Gleevec. 4. Increased "tearing"-improved with discontinuance of Gleevec. She continues to have mild tearing. 5. History of a fine papular rash over the trunk and legs-likely secondary to the nilotinib.  6. Mild QT prolongation and multifocal atrial pacemaker on EKG 08/04/2011. QT normal on EKG 02/25/2014. 7. History of mild hypokalemia -maintained on oral potassium replacement. Potassium in normal range on labs today.   Disposition: Ms. Verret appears stable. She remains in hematologic remission from CML. She will continue Nilotinib. We will follow-up on the peripheral blood PCR from today. She will return for a follow-up visit in 3 months.    [name] ANP/GNP-BC   05/27/2014  10:08 AM

## 2014-06-17 ENCOUNTER — Other Ambulatory Visit: Payer: Self-pay

## 2014-06-17 DIAGNOSIS — Z1231 Encounter for screening mammogram for malignant neoplasm of breast: Secondary | ICD-10-CM

## 2014-07-02 ENCOUNTER — Telehealth: Payer: Self-pay | Admitting: *Deleted

## 2014-07-02 NOTE — Telephone Encounter (Signed)
Faxed refill request for Tasigna received and placed on Dr. Gearldine Shown nurse's desk.

## 2014-07-03 ENCOUNTER — Other Ambulatory Visit: Payer: Self-pay | Admitting: *Deleted

## 2014-07-03 DIAGNOSIS — C9210 Chronic myeloid leukemia, BCR/ABL-positive, not having achieved remission: Secondary | ICD-10-CM

## 2014-07-03 MED ORDER — NILOTINIB HCL 150 MG PO CAPS: 300.0000 mg | ORAL_CAPSULE | Freq: Every day | ORAL | Status: DC

## 2014-07-09 ENCOUNTER — Telehealth: Payer: Self-pay | Admitting: *Deleted

## 2014-07-09 DIAGNOSIS — C9210 Chronic myeloid leukemia, BCR/ABL-positive, not having achieved remission: Secondary | ICD-10-CM

## 2014-07-09 MED ORDER — NILOTINIB HCL 150 MG PO CAPS: 300.0000 mg | ORAL_CAPSULE | Freq: Every day | ORAL | Status: DC

## 2014-07-09 NOTE — Telephone Encounter (Addendum)
Call from [facility] with [facility]- pt's prescription for Tasigna has been sent to [facility]. They are the preferred pharmacy for her insurance. Spoke with pt, she has one week supply of Tasigna. She understands to expect call from [facility] with co-pay information and to set up delivery. Faxed Rx to [facility].

## 2014-07-10 ENCOUNTER — Telehealth: Payer: Self-pay | Admitting: *Deleted

## 2014-07-10 NOTE — Telephone Encounter (Signed)
Received fax from [employer]: Tasigna is approved through 07/09/15 under pt's Medicare Part D benefit.

## 2014-07-11 ENCOUNTER — Encounter: Payer: Self-pay | Admitting: Internal Medicine

## 2014-07-11 ENCOUNTER — Other Ambulatory Visit: Payer: Self-pay | Admitting: *Deleted

## 2014-07-11 MED ORDER — POTASSIUM CHLORIDE CRYS ER 20 MEQ PO TBCR: 20.0000 meq | EXTENDED_RELEASE_TABLET | Freq: Every day | ORAL | Status: DC

## 2014-07-15 ENCOUNTER — Telehealth: Payer: Self-pay | Admitting: *Deleted

## 2014-07-15 NOTE — Telephone Encounter (Signed)
Received VM message from patient regarding her Tasigna prescription. This prescription is now being filled @ [facility] instead of [facility]. The prescription was to have been sent to [facility] on 07/09/14 but pt states that [facility] does not have it.  Call made to [facility] and verbal order for Tasigna 150 mg 2 capsules by mouth daily  For 28 days given to pharmacist, including 2 refills.  Ref # [id number]. Phone # [phone]. Ms. Stiner is going out of town and needs this sent to her by tomorrow. Per [facility] pharmacist, pt needs to call them to make these arrangements.  Call back to patient to provide her with phone # and ref. #. Informed her that she needs to call [facility] herself to make faster delivery arrangements.  Ms. Hindley verbalized understanding.

## 2014-07-22 ENCOUNTER — Ambulatory Visit
Admission: RE | Admit: 2014-07-22 | Discharge: 2014-07-22 | Disposition: A | Discharge status: Home / Self Care | Payer: Medicare Other | Source: Ambulatory Visit

## 2014-07-22 DIAGNOSIS — Z1231 Encounter for screening mammogram for malignant neoplasm of breast: Secondary | ICD-10-CM

## 2014-08-26 ENCOUNTER — Telehealth: Payer: Self-pay | Admitting: Oncology

## 2014-08-26 ENCOUNTER — Other Ambulatory Visit (HOSPITAL_BASED_OUTPATIENT_CLINIC_OR_DEPARTMENT_OTHER): Payer: Medicare Other

## 2014-08-26 ENCOUNTER — Ambulatory Visit: Payer: Medicare Other

## 2014-08-26 ENCOUNTER — Ambulatory Visit (HOSPITAL_BASED_OUTPATIENT_CLINIC_OR_DEPARTMENT_OTHER): Payer: Medicare Other | Admitting: Oncology

## 2014-08-26 VITALS — BP 142/66 | HR 60 | Temp 98.4°F | Resp 18 | Ht 63.0 in | Wt 123.8 lb

## 2014-08-26 DIAGNOSIS — C9210 Chronic myeloid leukemia, BCR/ABL-positive, not having achieved remission: Secondary | ICD-10-CM

## 2014-08-26 DIAGNOSIS — C9290 Myeloid leukemia, unspecified, not having achieved remission: Secondary | ICD-10-CM

## 2014-08-26 LAB — COMPREHENSIVE METABOLIC PANEL (CC13)
ALT: 14 U/L (ref 0–55)
AST: 16 U/L (ref 5–34)
Albumin: 3.8 g/dL (ref 3.5–5.0)
Alkaline Phosphatase: 33 U/L — ABNORMAL LOW (ref 40–150)
Anion Gap: 7 mEq/L (ref 3–11)
BUN: 16.8 mg/dL (ref 7.0–26.0)
CO2: 27 mEq/L (ref 22–29)
Calcium: 9.1 mg/dL (ref 8.4–10.4)
Chloride: 107 mEq/L (ref 98–109)
Creatinine: 1 mg/dL (ref 0.6–1.1)
EGFR: 53 mL/min/{1.73_m2} — ABNORMAL LOW (ref >90)
Glucose: 109 mg/dl (ref 70–140)
Potassium: 4.1 mEq/L (ref 3.5–5.1)
Sodium: 141 mEq/L (ref 136–145)
Total Bilirubin: 0.88 mg/dL (ref 0.20–1.20)
Total Protein: 6.4 g/dL (ref 6.4–8.3)

## 2014-08-26 LAB — CBC WITH DIFFERENTIAL/PLATELET
BASO%: 1.2 % (ref 0.0–2.0)
Basophils Absolute: 0.1 10*3/uL (ref 0.0–0.1)
EOS%: 5.2 % (ref 0.0–7.0)
Eosinophils Absolute: 0.3 10*3/uL (ref 0.0–0.5)
HCT: 39.2 % (ref 34.8–46.6)
HGB: 13.7 g/dL (ref 11.6–15.9)
LYMPH%: 25 % (ref 14.0–49.7)
MCH: 31.3 pg (ref 25.1–34.0)
MCHC: 34.8 g/dL (ref 31.5–36.0)
MCV: 89.7 fL (ref 79.5–101.0)
MONO#: 0.5 10*3/uL (ref 0.1–0.9)
MONO%: 7.9 % (ref 0.0–14.0)
NEUT#: 3.9 10*3/uL (ref 1.5–6.5)
NEUT%: 60.7 % (ref 38.4–76.8)
Platelets: 229 10*3/uL (ref 145–400)
RBC: 4.37 10*6/uL (ref 3.70–5.45)
RDW: 12.9 % (ref 11.2–14.5)
WBC: 6.5 10*3/uL (ref 3.9–10.3)
lymph#: 1.6 10*3/uL (ref 0.9–3.3)

## 2014-08-26 NOTE — Telephone Encounter (Signed)
Gave and printed appt sched and avs fo rpt for OCT °

## 2014-08-26 NOTE — Progress Notes (Signed)
  [facility] OFFICE PROGRESS NOTE   Diagnosis: CML  INTERVAL HISTORY:   [name] returns as scheduled. She continues nilotinib. She reports chronic occasional diarrhea. No rash. Persistent periorbital edema.  Objective:  Vital signs in last 24 hours:  Blood pressure 142/66, pulse 60, temperature 98.4 F (36.9 C), temperature source Oral, resp. rate 18, height 5\' 3"  (1.6 m), weight 123 lb 12.8 oz (56.155 kg), SpO2 99 %.    HEENT: No thrush or ulcers, mild periorbital edema Resp: Lungs clear bilaterally Cardio: Regular rate and rhythm GI: No hepatosplenomegaly Vascular: No leg edema  Skin: No rash     Lab Results:  Lab Results  Component Value Date   WBC 6.5 08/26/2014   HGB 13.7 08/26/2014   HCT 39.2 08/26/2014   MCV 89.7 08/26/2014   PLT 229 08/26/2014   NEUTROABS 3.9 08/26/2014   EKG: Sinus bradycardia, ventricular rate 50, QT 470/QTc 428  Medications: I have reviewed the patient's current medications.  Assessment/Plan: 1. Chronic myelogenous leukemia -- initially treated with hydroxyurea. She began [place] on 08/27/2009. Gleevec was placed on hold on 10/06/2009 due to poor tolerance of the 400 mg daily dose. Gleevec was restarted when she had hematologic improvement on 10/13/2009 and the dose was changed to 300 mg on 10/26/2009. The Gleevec dose was reduced to 200 mg daily due to excessive "tearing". The peripheral blood BCR/ABL returned at 16% on 04/19/2010. Gleevec was discontinued secondary to toxicity and a persistent elevation of the peripheral blood translocation . She began Nilotinib on 06/09/2011. Nilotinib placed on hold beginning 08/04/2011 due to fatigue/dyspnea on exertion and slight QT prolongation. Nilotinib was resumed on 08/15/2011 at a dose of 300 mg daily. Peripheral blood PCR was negative on 12/27/2011 and 05/14/2012. Mild elevation of the peripheral blood PCR on 10/02/2012. Improved on repeat peripheral blood PCR 01/01/2013. Stable  02/21/2013. Stable 05/21/2013. Stable 11/22/2013. Stable 02/25/2014. Stable 05/27/2014 2. Diarrhea. Resolved since discontinuing Gleevec. 3. Periorbital edema -improved since stopping Gleevec. 4. Increased "tearing"-improved with discontinuance of Gleevec. She continues to have mild tearing. 5. History of a fine papular rash over the trunk and legs-likely secondary to the nilotinib.  6. Mild QT prolongation and multifocal atrial pacemaker on EKG 08/04/2011. QT normal on EKG 08/26/2014 7. History of mild hypokalemia -maintained on oral potassium replacement. Potassium in normal range on labs today.  Disposition:  She appears stable. The plan is to continue nilotinib at the current dose. [name] will return for an office and lab visit in 3 months.  Jill Coder, MD  08/26/2014  5:12 PM

## 2014-09-10 ENCOUNTER — Telehealth: Payer: Self-pay | Admitting: *Deleted

## 2014-09-10 NOTE — Telephone Encounter (Signed)
-----   Message from Ladell Pier, MD sent at 09/01/2014  5:04 PM EDT ----- Please call patient, pcr is stable, f/u as scheduled

## 2014-09-10 NOTE — Telephone Encounter (Signed)
Pt informed of stable PCR. She voiced understanding.

## 2014-10-09 ENCOUNTER — Other Ambulatory Visit: Payer: Self-pay | Admitting: *Deleted

## 2014-10-09 DIAGNOSIS — C9210 Chronic myeloid leukemia, BCR/ABL-positive, not having achieved remission: Secondary | ICD-10-CM

## 2014-10-09 MED ORDER — NILOTINIB HCL 150 MG PO CAPS: 300.0000 mg | ORAL_CAPSULE | Freq: Every day | ORAL | Status: DC

## 2014-10-09 NOTE — Telephone Encounter (Signed)
"  I called for my refill on Tasigna.  Can someone call me.  I have two pills left and will run out Saturday.  [facility] says they need orders."  This nurse entered refill order sent to provider for signature.  Called [phone] to notify patient.  Reports [facility] has told her they will sip priority when order received.

## 2014-10-15 ENCOUNTER — Other Ambulatory Visit: Payer: Self-pay | Admitting: *Deleted

## 2014-10-15 DIAGNOSIS — C9210 Chronic myeloid leukemia, BCR/ABL-positive, not having achieved remission: Secondary | ICD-10-CM

## 2014-10-15 MED ORDER — NILOTINIB HCL 150 MG PO CAPS: 300.0000 mg | ORAL_CAPSULE | Freq: Every day | ORAL | Status: DC

## 2014-10-15 NOTE — Telephone Encounter (Signed)
Patient called reporting she is out of Tasigna and hasn't heard from [facility].  Called [facility].  Message that name is changing to [facility] and call lost.  Will resend order at this time.  [phone] called [facility], spoke with Alver Fisher who confirms "receipt of order, marked STAT and she will email the pharmacy to release it."

## 2014-11-25 ENCOUNTER — Ambulatory Visit (HOSPITAL_BASED_OUTPATIENT_CLINIC_OR_DEPARTMENT_OTHER): Payer: Medicare Other | Admitting: Nurse Practitioner

## 2014-11-25 ENCOUNTER — Other Ambulatory Visit (HOSPITAL_BASED_OUTPATIENT_CLINIC_OR_DEPARTMENT_OTHER): Payer: Medicare Other

## 2014-11-25 ENCOUNTER — Telehealth: Payer: Self-pay | Admitting: Oncology

## 2014-11-25 VITALS — BP 152/65 | HR 63 | Temp 98.4°F | Resp 18 | Ht 63.0 in | Wt 123.3 lb

## 2014-11-25 DIAGNOSIS — E876 Hypokalemia: Secondary | ICD-10-CM | POA: Diagnosis not present

## 2014-11-25 DIAGNOSIS — C9210 Chronic myeloid leukemia, BCR/ABL-positive, not having achieved remission: Secondary | ICD-10-CM

## 2014-11-25 LAB — COMPREHENSIVE METABOLIC PANEL (CC13)
ALT: 16 U/L (ref 0–55)
AST: 15 U/L (ref 5–34)
Albumin: 3.9 g/dL (ref 3.5–5.0)
Alkaline Phosphatase: 34 U/L — ABNORMAL LOW (ref 40–150)
Anion Gap: 8 mEq/L (ref 3–11)
BUN: 23 mg/dL (ref 7.0–26.0)
CO2: 25 mEq/L (ref 22–29)
Calcium: 9.4 mg/dL (ref 8.4–10.4)
Chloride: 107 mEq/L (ref 98–109)
Creatinine: 1.1 mg/dL (ref 0.6–1.1)
EGFR: 47 mL/min/{1.73_m2} — ABNORMAL LOW (ref >90)
Glucose: 124 mg/dl (ref 70–140)
Potassium: 3.8 mEq/L (ref 3.5–5.1)
Sodium: 141 mEq/L (ref 136–145)
Total Bilirubin: 0.89 mg/dL (ref 0.20–1.20)
Total Protein: 6.7 g/dL (ref 6.4–8.3)

## 2014-11-25 LAB — CBC WITH DIFFERENTIAL/PLATELET
BASO%: 0.6 % (ref 0.0–2.0)
Basophils Absolute: 0 10*3/uL (ref 0.0–0.1)
EOS%: 5.5 % (ref 0.0–7.0)
Eosinophils Absolute: 0.3 10*3/uL (ref 0.0–0.5)
HCT: 40.3 % (ref 34.8–46.6)
HGB: 14 g/dL (ref 11.6–15.9)
LYMPH%: 22.8 % (ref 14.0–49.7)
MCH: 31.2 pg (ref 25.1–34.0)
MCHC: 34.7 g/dL (ref 31.5–36.0)
MCV: 89.8 fL (ref 79.5–101.0)
MONO#: 0.4 10*3/uL (ref 0.1–0.9)
MONO%: 7 % (ref 0.0–14.0)
NEUT#: 4 10*3/uL (ref 1.5–6.5)
NEUT%: 64.1 % (ref 38.4–76.8)
Platelets: 206 10*3/uL (ref 145–400)
RBC: 4.49 10*6/uL (ref 3.70–5.45)
RDW: 12.8 % (ref 11.2–14.5)
WBC: 6.2 10*3/uL (ref 3.9–10.3)
lymph#: 1.4 10*3/uL (ref 0.9–3.3)

## 2014-11-25 NOTE — Progress Notes (Signed)
  [place] OFFICE PROGRESS NOTE   Diagnosis:  CML  INTERVAL HISTORY:   [name] returns as scheduled. She continues Nilotinib. She has occasional mild nausea. No vomiting. No mouth sores. No diarrhea. No skin rash. She denies shortness of breath and chest pain. No leg swelling. She has intermittent periorbital edema and headaches. She thinks both may be related to "allergies".  Objective:  Vital signs in last 24 hours:  Blood pressure 152/65, pulse 63, temperature 98.4 F (36.9 C), temperature source Oral, resp. rate 18, height 5\' 3"  (1.6 m), weight 123 lb 4.8 oz (55.929 kg), SpO2 99 %.    HEENT: No thrush or ulcers. Mild periorbital edema. Resp: Lungs clear bilaterally. Cardio: Regular rate and rhythm. GI: Abdomen soft and nontender. No hepatomegaly. No splenomegaly. Vascular: No leg edema. Calves soft and nontender. Skin: No rash.    Lab Results:  Lab Results  Component Value Date   WBC 6.2 11/25/2014   HGB 14.0 11/25/2014   HCT 40.3 11/25/2014   MCV 89.8 11/25/2014   PLT 206 11/25/2014   NEUTROABS 4.0 11/25/2014    Imaging:  No results found.  Medications: I have reviewed the patient's current medications.  Assessment/Plan: 1. Chronic myelogenous leukemia -- initially treated with hydroxyurea. She began [place] on 08/27/2009. Gleevec was placed on hold on 10/06/2009 due to poor tolerance of the 400 mg daily dose. Gleevec was restarted when she had hematologic improvement on 10/13/2009 and the dose was changed to 300 mg on 10/26/2009. The Gleevec dose was reduced to 200 mg daily due to excessive "tearing". The peripheral blood BCR/ABL returned at 16% on 04/19/2010. Gleevec was discontinued secondary to toxicity and a persistent elevation of the peripheral blood translocation . She began Nilotinib on 06/09/2011. Nilotinib placed on hold beginning 08/04/2011 due to fatigue/dyspnea on exertion and slight QT prolongation. Nilotinib was resumed on  08/15/2011 at a dose of 300 mg daily. Peripheral blood PCR was negative on 12/27/2011 and 05/14/2012. Mild elevation of the peripheral blood PCR on 10/02/2012. Improved on repeat peripheral blood PCR 01/01/2013. Stable 02/21/2013. Stable 05/21/2013. Stable 11/22/2013. Stable 02/25/2014. Stable 05/27/2014. Stable 08/26/2014. 2. Diarrhea. Resolved since discontinuing Gleevec. 3. Periorbital edema -improved since stopping Gleevec. 4. Increased "tearing"-improved with discontinuance of Gleevec. She continues to have mild tearing. 5. History of a fine papular rash over the trunk and legs-likely secondary to the nilotinib.  6. Mild QT prolongation and multifocal atrial pacemaker on EKG 08/04/2011. QT normal on EKG 08/26/2014 7. History of mild hypokalemia -maintained on oral potassium replacement. Potassium in normal range on labs today.   Disposition: [name] appears stable. Plan to continue Nilotinib. She will return for follow-up visit in 3 months. She will contact the office in the interim with any problems.    [name] ANP/GNP-BC   11/25/2014  10:50 AM

## 2014-11-25 NOTE — Telephone Encounter (Signed)
per pof to sch pt appt-gave pt copy of avs °

## 2014-11-26 ENCOUNTER — Other Ambulatory Visit: Payer: Medicare Other

## 2014-11-26 ENCOUNTER — Ambulatory Visit: Payer: Medicare Other | Admitting: Nurse Practitioner

## 2015-01-06 ENCOUNTER — Telehealth: Payer: Self-pay | Admitting: *Deleted

## 2015-01-06 DIAGNOSIS — C9210 Chronic myeloid leukemia, BCR/ABL-positive, not having achieved remission: Secondary | ICD-10-CM

## 2015-01-06 MED ORDER — NILOTINIB HCL 150 MG PO CAPS: 300.0000 mg | ORAL_CAPSULE | Freq: Every day | ORAL | Status: DC

## 2015-01-06 NOTE — Telephone Encounter (Signed)
Sarah with [facility] called on patient's behalf requesting refill for Tasigna.  Asked for refill to be sent to [place], [place].  Pharmacy list updated and will send order to [facility].  Next F/U 02-24-2015.

## 2015-02-24 ENCOUNTER — Telehealth: Payer: Self-pay | Admitting: Oncology

## 2015-02-24 ENCOUNTER — Other Ambulatory Visit: Payer: Self-pay | Admitting: *Deleted

## 2015-02-24 ENCOUNTER — Ambulatory Visit (HOSPITAL_BASED_OUTPATIENT_CLINIC_OR_DEPARTMENT_OTHER): Payer: Medicare Other | Admitting: Oncology

## 2015-02-24 ENCOUNTER — Other Ambulatory Visit (HOSPITAL_BASED_OUTPATIENT_CLINIC_OR_DEPARTMENT_OTHER): Payer: Medicare Other

## 2015-02-24 VITALS — BP 161/76 | HR 79 | Temp 98.2°F | Resp 18 | Ht 63.0 in | Wt 123.8 lb

## 2015-02-24 DIAGNOSIS — C9211 Chronic myeloid leukemia, BCR/ABL-positive, in remission: Secondary | ICD-10-CM

## 2015-02-24 DIAGNOSIS — C9210 Chronic myeloid leukemia, BCR/ABL-positive, not having achieved remission: Secondary | ICD-10-CM

## 2015-02-24 LAB — CBC WITH DIFFERENTIAL/PLATELET
BASO%: 0.8 % (ref 0.0–2.0)
Basophils Absolute: 0.1 10*3/uL (ref 0.0–0.1)
EOS%: 4.9 % (ref 0.0–7.0)
Eosinophils Absolute: 0.3 10*3/uL (ref 0.0–0.5)
HCT: 41.7 % (ref 34.8–46.6)
HGB: 14.2 g/dL (ref 11.6–15.9)
LYMPH%: 27.9 % (ref 14.0–49.7)
MCH: 30.9 pg (ref 25.1–34.0)
MCHC: 34.1 g/dL (ref 31.5–36.0)
MCV: 90.5 fL (ref 79.5–101.0)
MONO#: 0.6 10*3/uL (ref 0.1–0.9)
MONO%: 8.1 % (ref 0.0–14.0)
NEUT#: 3.9 10*3/uL (ref 1.5–6.5)
NEUT%: 58.3 % (ref 38.4–76.8)
Platelets: 231 10*3/uL (ref 145–400)
RBC: 4.61 10*6/uL (ref 3.70–5.45)
RDW: 12.8 % (ref 11.2–14.5)
WBC: 6.8 10*3/uL (ref 3.9–10.3)
lymph#: 1.9 10*3/uL (ref 0.9–3.3)

## 2015-02-24 LAB — COMPREHENSIVE METABOLIC PANEL
ALT: 20 U/L (ref 0–55)
AST: 20 U/L (ref 5–34)
Albumin: 4 g/dL (ref 3.5–5.0)
Alkaline Phosphatase: 37 U/L — ABNORMAL LOW (ref 40–150)
Anion Gap: 8 mEq/L (ref 3–11)
BUN: 17.7 mg/dL (ref 7.0–26.0)
CO2: 26 mEq/L (ref 22–29)
Calcium: 9.4 mg/dL (ref 8.4–10.4)
Chloride: 106 mEq/L (ref 98–109)
Creatinine: 1.1 mg/dL (ref 0.6–1.1)
EGFR: 48 mL/min/{1.73_m2} — ABNORMAL LOW (ref >90)
Glucose: 93 mg/dl (ref 70–140)
Potassium: 4 mEq/L (ref 3.5–5.1)
Sodium: 140 mEq/L (ref 136–145)
Total Bilirubin: 0.71 mg/dL (ref 0.20–1.20)
Total Protein: 7.1 g/dL (ref 6.4–8.3)

## 2015-02-24 NOTE — Progress Notes (Signed)
  [place] OFFICE PROGRESS NOTE   Diagnosis: CML  INTERVAL HISTORY:   [name] returns as scheduled. She continues nilotinib. She has occasional diarrhea. No other complaint. She relates periorbital edema to allergies.  Objective:  Vital signs in last 24 hours:  Blood pressure 161/76, pulse 79, temperature 98.2 F (36.8 C), temperature source Oral, resp. rate 18, height 5\' 3"  (1.6 m), weight 123 lb 12.8 oz (56.155 kg), SpO2 100 %.    HEENT: No thrush or ulcers, mild. Orbital edema Resp: Lungs clear bilaterally Cardio: Regular rate and rhythm GI: No hepatosplenomegaly Vascular: No leg edema   Lab Results:  Lab Results  Component Value Date   WBC 6.8 02/24/2015   HGB 14.2 02/24/2015   HCT 41.7 02/24/2015   MCV 90.5 02/24/2015   PLT 231 02/24/2015   NEUTROABS 3.9 02/24/2015   Potassium 4.0, creatinine 1.1   Peripheral blood BCR/ABL on 11/25/2014-0.274%  Medications: I have reviewed the patient's current medications.  Assessment/Plan: 1. Chronic myelogenous leukemia -- initially treated with hydroxyurea. She began [place] on 08/27/2009. Gleevec was placed on hold on 10/06/2009 due to poor tolerance of the 400 mg daily dose. Gleevec was restarted when she had hematologic improvement on 10/13/2009 and the dose was changed to 300 mg on 10/26/2009. The Gleevec dose was reduced to 200 mg daily due to excessive "tearing". The peripheral blood BCR/ABL returned at 16% on 04/19/2010. Gleevec was discontinued secondary to toxicity and a persistent elevation of the peripheral blood translocation . She began Nilotinib on 06/09/2011. Nilotinib placed on hold beginning 08/04/2011 due to fatigue/dyspnea on exertion and slight QT prolongation. Nilotinib was resumed on 08/15/2011 at a dose of 300 mg daily. Peripheral blood PCR was negative on 12/27/2011 and 05/14/2012. Mild elevation of the peripheral blood PCR on 10/02/2012. Improved on repeat peripheral blood PCR  01/01/2013. Stable 11/25/2014  2. Diarrhea. Intermittent, mild. 3. Periorbital edema -improved since stopping Gleevec. 4. Increased "tearing"-improved with discontinuance of Gleevec.  5. History of a fine papular rash over the trunk and legs-likely secondary to the nilotinib.  6. Mild QT prolongation and multifocal atrial pacemaker on EKG 08/04/2011. QT normal on EKG 08/26/2014 7. History of mild hypokalemia -maintained on oral potassium replacement. Potassium in normal range on labs today. 8. Hypertension   Disposition:  [name] remains in hematologic remission from the [facility]. She will continue nilotinib and we will follow-up on the peripheral blood PCR from today. She will be scheduled for a lab visit and EKG in 3 months. She will return for an office visit in 6 months.  Betsy Coder, MD  02/24/2015  12:47 PM

## 2015-02-24 NOTE — Telephone Encounter (Signed)
Talked to patient here in office. Scheduled appt.       [facility]. °

## 2015-02-25 NOTE — Telephone Encounter (Signed)
Added EKG to 4/18 lab/fu - added for AR - time adjusted - spoke with patient re EKG and new time for 4/18 @ 10:45 am.

## 2015-03-03 ENCOUNTER — Telehealth: Payer: Self-pay | Admitting: *Deleted

## 2015-03-03 NOTE — Telephone Encounter (Signed)
Per Dr. Benay Spice; notified pt that PCR results are better.  Pt verbalized understanding and expressed appreciation for call; confirmed appt for 05/2015

## 2015-03-16 ENCOUNTER — Other Ambulatory Visit: Payer: Self-pay | Admitting: *Deleted

## 2015-03-16 DIAGNOSIS — C9210 Chronic myeloid leukemia, BCR/ABL-positive, not having achieved remission: Secondary | ICD-10-CM

## 2015-03-16 MED ORDER — NILOTINIB HCL 150 MG PO CAPS: 300.0000 mg | ORAL_CAPSULE | Freq: Every day | ORAL | Status: DC

## 2015-04-22 ENCOUNTER — Other Ambulatory Visit: Payer: Self-pay | Admitting: Oncology

## 2015-05-26 ENCOUNTER — Other Ambulatory Visit: Payer: Self-pay

## 2015-05-26 ENCOUNTER — Ambulatory Visit (HOSPITAL_BASED_OUTPATIENT_CLINIC_OR_DEPARTMENT_OTHER): Payer: Medicare Other | Admitting: Nurse Practitioner

## 2015-05-26 ENCOUNTER — Telehealth: Payer: Self-pay | Admitting: Oncology

## 2015-05-26 ENCOUNTER — Other Ambulatory Visit (HOSPITAL_BASED_OUTPATIENT_CLINIC_OR_DEPARTMENT_OTHER): Payer: Medicare Other

## 2015-05-26 VITALS — BP 166/81 | HR 55 | Temp 98.3°F | Resp 18 | Ht 63.0 in | Wt 124.6 lb

## 2015-05-26 DIAGNOSIS — C9210 Chronic myeloid leukemia, BCR/ABL-positive, not having achieved remission: Secondary | ICD-10-CM

## 2015-05-26 DIAGNOSIS — C9211 Chronic myeloid leukemia, BCR/ABL-positive, in remission: Secondary | ICD-10-CM

## 2015-05-26 LAB — COMPREHENSIVE METABOLIC PANEL
ALT: 17 U/L (ref 0–55)
AST: 20 U/L (ref 5–34)
Albumin: 3.9 g/dL (ref 3.5–5.0)
Alkaline Phosphatase: 34 U/L — ABNORMAL LOW (ref 40–150)
Anion Gap: 7 mEq/L (ref 3–11)
BUN: 17.7 mg/dL (ref 7.0–26.0)
CO2: 26 mEq/L (ref 22–29)
Calcium: 9.7 mg/dL (ref 8.4–10.4)
Chloride: 107 mEq/L (ref 98–109)
Creatinine: 1 mg/dL (ref 0.6–1.1)
EGFR: 53 mL/min/{1.73_m2} — ABNORMAL LOW (ref >90)
Glucose: 95 mg/dl (ref 70–140)
Potassium: 4.5 mEq/L (ref 3.5–5.1)
Sodium: 141 mEq/L (ref 136–145)
Total Bilirubin: 0.94 mg/dL (ref 0.20–1.20)
Total Protein: 6.9 g/dL (ref 6.4–8.3)

## 2015-05-26 LAB — CBC WITH DIFFERENTIAL/PLATELET
BASO%: 1.2 % (ref 0.0–2.0)
Basophils Absolute: 0.1 10*3/uL (ref 0.0–0.1)
EOS%: 6 % (ref 0.0–7.0)
Eosinophils Absolute: 0.4 10*3/uL (ref 0.0–0.5)
HCT: 42.1 % (ref 34.8–46.6)
HGB: 14.4 g/dL (ref 11.6–15.9)
LYMPH%: 27 % (ref 14.0–49.7)
MCH: 31.1 pg (ref 25.1–34.0)
MCHC: 34.1 g/dL (ref 31.5–36.0)
MCV: 91.3 fL (ref 79.5–101.0)
MONO#: 0.6 10*3/uL (ref 0.1–0.9)
MONO%: 9.2 % (ref 0.0–14.0)
NEUT#: 3.4 10*3/uL (ref 1.5–6.5)
NEUT%: 56.6 % (ref 38.4–76.8)
Platelets: 225 10*3/uL (ref 145–400)
RBC: 4.62 10*6/uL (ref 3.70–5.45)
RDW: 13 % (ref 11.2–14.5)
WBC: 6.1 10*3/uL (ref 3.9–10.3)
lymph#: 1.6 10*3/uL (ref 0.9–3.3)

## 2015-05-26 NOTE — Progress Notes (Signed)
  [place] OFFICE PROGRESS NOTE   Diagnosis:   CML  INTERVAL HISTORY:   Ms. Mountz returns as scheduled. She continues not new. She feels well. No nausea or vomiting. She has occasional diarrhea. No mouth sores. No rash. She denies shortness of breath. No leg edema. No abdominal pain.  Objective:  Vital signs in last 24 hours:  Blood pressure 166/81, pulse 55, temperature 98.3 F (36.8 C), temperature source Oral, resp. rate 18, height 5\' 3"  (1.6 m), weight 124 lb 9.6 oz (56.518 kg), SpO2 98 %.    HEENT: No thrush or ulcers. Mild bilateral periorbital edema. Resp: Lungs clear bilaterally. Cardio: Regular rate and rhythm. GI: Abdomen soft and nontender. No hepatomegaly. No splenomegaly. Vascular: No leg edema. Calves soft and nontender. Skin: No rash.    Lab Results:  Lab Results  Component Value Date   WBC 6.1 05/26/2015   HGB 14.4 05/26/2015   HCT 42.1 05/26/2015   MCV 91.3 05/26/2015   PLT 225 05/26/2015   NEUTROABS 3.4 05/26/2015  Peripheral blood BCR/ABL 02/24/2015 0.1826% EKG 05/26/2015 QTc 425 Imaging:  No results found.  Medications: I have reviewed the patient's current medications.  Assessment/Plan: 1. Chronic myelogenous leukemia -- initially treated with hydroxyurea. She began [place] on 08/27/2009. Gleevec was placed on hold on 10/06/2009 due to poor tolerance of the 400 mg daily dose. Gleevec was restarted when she had hematologic improvement on 10/13/2009 and the dose was changed to 300 mg on 10/26/2009. The Gleevec dose was reduced to 200 mg daily due to excessive "tearing". The peripheral blood BCR/ABL returned at 16% on 04/19/2010. Gleevec was discontinued secondary to toxicity and a persistent elevation of the peripheral blood translocation . She began Nilotinib on 06/09/2011. Nilotinib placed on hold beginning 08/04/2011 due to fatigue/dyspnea on exertion and slight QT prolongation. Nilotinib was resumed on 08/15/2011 at a dose of 300  mg daily. Peripheral blood PCR was negative on 12/27/2011 and 05/14/2012. Mild elevation of the peripheral blood PCR on 10/02/2012. Improved on repeat peripheral blood PCR 01/01/2013. Stable 11/25/2014. Improved 02/24/2015. 2. Diarrhea. Intermittent, mild. 3. Periorbital edema -improved since stopping Gleevec. 4. Increased "tearing"-improved with discontinuance of Gleevec.  5. History of a fine papular rash over the trunk and legs-likely secondary to the nilotinib.  6. Mild QT prolongation and multifocal atrial pacemaker on EKG 08/04/2011. QT normal on EKG 08/26/2014. QTc normal 05/26/2015. 7. History of mild hypokalemia -maintained on oral potassium replacement. Potassium in normal range on labs today. 8. Hypertension   Disposition: Ms. Gallego appears well. She remains in hematologic remission from CML. She will continue Nilotinib. We will follow-up on the peripheral blood PCR from today. She will return for a follow-up visit, labs and EKG in 3 months.    [name] ANP/GNP-BC   05/26/2015  12:10 PM

## 2015-05-26 NOTE — Telephone Encounter (Signed)
Gave and printed appt sched and avs for pt for July  °

## 2015-06-01 ENCOUNTER — Other Ambulatory Visit: Payer: Self-pay | Admitting: *Deleted

## 2015-06-01 DIAGNOSIS — C9210 Chronic myeloid leukemia, BCR/ABL-positive, not having achieved remission: Secondary | ICD-10-CM

## 2015-06-01 MED ORDER — NILOTINIB HCL 150 MG PO CAPS: 300.0000 mg | ORAL_CAPSULE | Freq: Every day | ORAL | Status: DC

## 2015-06-03 MED ORDER — NILOTINIB HCL 150 MG PO CAPS: 300.0000 mg | ORAL_CAPSULE | Freq: Every day | ORAL | Status: DC

## 2015-06-03 NOTE — Addendum Note (Signed)
Addended by: Brien Few on: 06/03/2015 04:10 PM   Modules accepted: Orders

## 2015-06-03 NOTE — Telephone Encounter (Signed)
Received fax from [facility]: Prescription referral was transitioned to [facility]. All future refills should be sent to [facility]. [place], pt's Tasigna was shipped on 4/20. They have the most recent refill order and will be able to process when due.

## 2015-06-12 ENCOUNTER — Other Ambulatory Visit: Payer: Self-pay

## 2015-06-12 DIAGNOSIS — Z1231 Encounter for screening mammogram for malignant neoplasm of breast: Secondary | ICD-10-CM

## 2015-07-28 ENCOUNTER — Ambulatory Visit
Admission: RE | Admit: 2015-07-28 | Discharge: 2015-07-28 | Disposition: A | Discharge status: Home / Self Care | Payer: Medicare Other | Source: Ambulatory Visit

## 2015-07-28 DIAGNOSIS — Z1231 Encounter for screening mammogram for malignant neoplasm of breast: Secondary | ICD-10-CM

## 2015-08-25 ENCOUNTER — Ambulatory Visit (HOSPITAL_BASED_OUTPATIENT_CLINIC_OR_DEPARTMENT_OTHER): Payer: Medicare Other | Admitting: Oncology

## 2015-08-25 ENCOUNTER — Telehealth: Payer: Self-pay | Admitting: Oncology

## 2015-08-25 ENCOUNTER — Other Ambulatory Visit (HOSPITAL_BASED_OUTPATIENT_CLINIC_OR_DEPARTMENT_OTHER): Payer: Medicare Other

## 2015-08-25 ENCOUNTER — Other Ambulatory Visit: Payer: Self-pay

## 2015-08-25 VITALS — BP 156/81 | HR 61 | Temp 98.2°F | Resp 18 | Ht 63.0 in | Wt 124.2 lb

## 2015-08-25 DIAGNOSIS — R197 Diarrhea, unspecified: Secondary | ICD-10-CM

## 2015-08-25 DIAGNOSIS — C9210 Chronic myeloid leukemia, BCR/ABL-positive, not having achieved remission: Secondary | ICD-10-CM | POA: Diagnosis not present

## 2015-08-25 DIAGNOSIS — I10 Essential (primary) hypertension: Secondary | ICD-10-CM

## 2015-08-25 LAB — COMPREHENSIVE METABOLIC PANEL
ALT: 17 U/L (ref 0–55)
AST: 18 U/L (ref 5–34)
Albumin: 3.9 g/dL (ref 3.5–5.0)
Alkaline Phosphatase: 35 U/L — ABNORMAL LOW (ref 40–150)
Anion Gap: 8 mEq/L (ref 3–11)
BUN: 16.1 mg/dL (ref 7.0–26.0)
CO2: 26 mEq/L (ref 22–29)
Calcium: 9.4 mg/dL (ref 8.4–10.4)
Chloride: 106 mEq/L (ref 98–109)
Creatinine: 1.1 mg/dL (ref 0.6–1.1)
EGFR: 49 mL/min/{1.73_m2} — ABNORMAL LOW (ref >90)
Glucose: 93 mg/dl (ref 70–140)
Potassium: 3.9 mEq/L (ref 3.5–5.1)
Sodium: 139 mEq/L (ref 136–145)
Total Bilirubin: 1.02 mg/dL (ref 0.20–1.20)
Total Protein: 6.8 g/dL (ref 6.4–8.3)

## 2015-08-25 LAB — CBC WITH DIFFERENTIAL/PLATELET
BASO%: 0.8 % (ref 0.0–2.0)
Basophils Absolute: 0.1 10*3/uL (ref 0.0–0.1)
EOS%: 5.5 % (ref 0.0–7.0)
Eosinophils Absolute: 0.4 10*3/uL (ref 0.0–0.5)
HCT: 39.5 % (ref 34.8–46.6)
HGB: 13.5 g/dL (ref 11.6–15.9)
LYMPH%: 26.2 % (ref 14.0–49.7)
MCH: 30.3 pg (ref 25.1–34.0)
MCHC: 34.1 g/dL (ref 31.5–36.0)
MCV: 89 fL (ref 79.5–101.0)
MONO#: 0.6 10*3/uL (ref 0.1–0.9)
MONO%: 8.6 % (ref 0.0–14.0)
NEUT#: 3.8 10*3/uL (ref 1.5–6.5)
NEUT%: 58.9 % (ref 38.4–76.8)
Platelets: 237 10*3/uL (ref 145–400)
RBC: 4.44 10*6/uL (ref 3.70–5.45)
RDW: 12.9 % (ref 11.2–14.5)
WBC: 6.5 10*3/uL (ref 3.9–10.3)
lymph#: 1.7 10*3/uL (ref 0.9–3.3)

## 2015-08-25 NOTE — Telephone Encounter (Signed)
Gave pt cal & avs °

## 2015-08-25 NOTE — Progress Notes (Signed)
  [facility] OFFICE PROGRESS NOTE   Diagnosis: CML  INTERVAL HISTORY:   [name] returns as scheduled per she feels well. She continues nilotinib. No rash. Occasional diarrhea. No complaint.  Objective:  Vital signs in last 24 hours:  Blood pressure 156/81, pulse 61, temperature 98.2 F (36.8 C), temperature source Oral, resp. rate 18, height 5\' 3"  (1.6 m), weight 124 lb 3.2 oz (56.337 kg), SpO2 100 %.    HEENT: No thrush or ulcers Resp: Lungs clear bilaterally Cardio: Regular rate and rhythm GI: No hepatosplenomegaly Vascular: No leg edema  Skin: No rash     Lab Results:  Lab Results  Component Value Date   WBC 6.5 08/25/2015   HGB 13.5 08/25/2015   HCT 39.5 08/25/2015   MCV 89.0 08/25/2015   PLT 237 08/25/2015   NEUTROABS 3.8 08/25/2015   Peripheral blood PCR on 05/26/2015-0.0996%  EKG: Sinus bradycardia, QTc-437  Medications: I have reviewed the patient's current medications.  Assessment/Plan: 1. Chronic myelogenous leukemia -- initially treated with hydroxyurea. She began [place] on 08/27/2009. Gleevec was placed on hold on 10/06/2009 due to poor tolerance of the 400 mg daily dose. Gleevec was restarted when she had hematologic improvement on 10/13/2009 and the dose was changed to 300 mg on 10/26/2009. The Gleevec dose was reduced to 200 mg daily due to excessive "tearing". The peripheral blood BCR/ABL returned at 16% on 04/19/2010. Gleevec was discontinued secondary to toxicity and a persistent elevation of the peripheral blood translocation . She began Nilotinib on 06/09/2011. Nilotinib placed on hold beginning 08/04/2011 due to fatigue/dyspnea on exertion and slight QT prolongation. Nilotinib was resumed on 08/15/2011 at a dose of 300 mg daily. Peripheral blood PCR was negative on 12/27/2011 and 05/14/2012. Mild elevation of the peripheral blood PCR on 10/02/2012. Improved on repeat peripheral blood PCR 01/01/2013. Stable 11/25/2014. Improved  05/26/2015 2. Diarrhea. Intermittent, mild. 3. Periorbital edema -improved since stopping Gleevec. 4. Increased "tearing"-improved with discontinuance of Gleevec.  5. History of a fine papular rash over the trunk and legs-likely secondary to the nilotinib.  6. Mild QT prolongation and multifocal atrial pacemaker on EKG 08/04/2011. QT normal on EKG 08/26/2014. QTc normal 05/26/2015 and 08/25/2015. 7. History of mild hypokalemia -maintained on oral potassium replacement. Potassium in normal range on labs today. 8. Hypertension   Disposition: [name] appears stable. She is tolerating the nilotinib well and remains in hematologic/molecular remission.  She will return for an office and lab visit in 3 months.   Betsy Coder, MD  08/25/2015  12:50 PM

## 2015-08-28 ENCOUNTER — Other Ambulatory Visit: Payer: Self-pay | Admitting: *Deleted

## 2015-08-28 DIAGNOSIS — C9210 Chronic myeloid leukemia, BCR/ABL-positive, not having achieved remission: Secondary | ICD-10-CM

## 2015-08-28 MED ORDER — NILOTINIB HCL 150 MG PO CAPS: 300.0000 mg | ORAL_CAPSULE | Freq: Every day | ORAL | Status: DC

## 2015-09-18 ENCOUNTER — Other Ambulatory Visit: Payer: Self-pay

## 2015-09-18 DIAGNOSIS — C9210 Chronic myeloid leukemia, BCR/ABL-positive, not having achieved remission: Secondary | ICD-10-CM

## 2015-10-02 ENCOUNTER — Other Ambulatory Visit: Payer: Self-pay | Admitting: *Deleted

## 2015-10-02 DIAGNOSIS — C9210 Chronic myeloid leukemia, BCR/ABL-positive, not having achieved remission: Secondary | ICD-10-CM

## 2015-10-02 MED ORDER — NILOTINIB HCL 150 MG PO CAPS: 300.0000 mg | ORAL_CAPSULE | Freq: Every day | ORAL | 2 refills | Status: DC

## 2015-10-12 ENCOUNTER — Other Ambulatory Visit: Payer: Self-pay | Admitting: Oncology

## 2015-10-29 ENCOUNTER — Telehealth: Payer: Self-pay | Admitting: *Deleted

## 2015-10-29 NOTE — Telephone Encounter (Signed)
Call received from patient stating that Tasigna was delivered to her from [facility] as a 200 mg dose and not 150 mg. Pt states that she has three 150 mg tablets left. Call placed by this RN to [facility] and they stated that they will call patient and send 150mg  dose ASAP.  Call placed back to patient to inform her that [facility] will be contacting her regarding delivery of wrong dose of medication and that 150mg  dose will be sent out ASAP.  Patient appreciative of call.

## 2015-11-19 ENCOUNTER — Telehealth: Payer: Self-pay | Admitting: *Deleted

## 2015-11-19 NOTE — Telephone Encounter (Signed)
Message from [place] at [facility] requesting supervising provider's information for potassium prescription written by [name], NP. "This is a third and final attempt." Returned call, spoke with Quillian Quince who stated they already have that information.

## 2015-11-24 ENCOUNTER — Encounter: Payer: Self-pay | Admitting: *Deleted

## 2015-11-24 ENCOUNTER — Other Ambulatory Visit (HOSPITAL_BASED_OUTPATIENT_CLINIC_OR_DEPARTMENT_OTHER): Payer: Medicare Other

## 2015-11-24 ENCOUNTER — Other Ambulatory Visit: Payer: Self-pay | Admitting: *Deleted

## 2015-11-24 ENCOUNTER — Telehealth: Payer: Self-pay | Admitting: Oncology

## 2015-11-24 ENCOUNTER — Ambulatory Visit (HOSPITAL_BASED_OUTPATIENT_CLINIC_OR_DEPARTMENT_OTHER): Payer: Medicare Other | Admitting: Nurse Practitioner

## 2015-11-24 VITALS — BP 162/74 | HR 64 | Temp 97.8°F | Resp 18 | Ht 63.0 in | Wt 123.7 lb

## 2015-11-24 DIAGNOSIS — C9210 Chronic myeloid leukemia, BCR/ABL-positive, not having achieved remission: Secondary | ICD-10-CM

## 2015-11-24 DIAGNOSIS — Z23 Encounter for immunization: Secondary | ICD-10-CM

## 2015-11-24 DIAGNOSIS — I10 Essential (primary) hypertension: Secondary | ICD-10-CM | POA: Diagnosis not present

## 2015-11-24 LAB — COMPREHENSIVE METABOLIC PANEL
ALT: 21 U/L (ref 0–55)
AST: 21 U/L (ref 5–34)
Albumin: 3.9 g/dL (ref 3.5–5.0)
Alkaline Phosphatase: 38 U/L — ABNORMAL LOW (ref 40–150)
Anion Gap: 10 mEq/L (ref 3–11)
BUN: 17.3 mg/dL (ref 7.0–26.0)
CO2: 25 mEq/L (ref 22–29)
Calcium: 9.7 mg/dL (ref 8.4–10.4)
Chloride: 105 mEq/L (ref 98–109)
Creatinine: 1.1 mg/dL (ref 0.6–1.1)
EGFR: 48 mL/min/{1.73_m2} — ABNORMAL LOW (ref >90)
Glucose: 112 mg/dl (ref 70–140)
Potassium: 3.7 mEq/L (ref 3.5–5.1)
Sodium: 141 mEq/L (ref 136–145)
Total Bilirubin: 0.98 mg/dL (ref 0.20–1.20)
Total Protein: 7 g/dL (ref 6.4–8.3)

## 2015-11-24 LAB — CBC WITH DIFFERENTIAL/PLATELET
BASO%: 0.7 % (ref 0.0–2.0)
Basophils Absolute: 0.1 10*3/uL (ref 0.0–0.1)
EOS%: 5.2 % (ref 0.0–7.0)
Eosinophils Absolute: 0.4 10*3/uL (ref 0.0–0.5)
HCT: 41.4 % (ref 34.8–46.6)
HGB: 14.3 g/dL (ref 11.6–15.9)
LYMPH%: 22.8 % (ref 14.0–49.7)
MCH: 30.8 pg (ref 25.1–34.0)
MCHC: 34.5 g/dL (ref 31.5–36.0)
MCV: 89 fL (ref 79.5–101.0)
MONO#: 0.6 10*3/uL (ref 0.1–0.9)
MONO%: 8.4 % (ref 0.0–14.0)
NEUT#: 4.2 10*3/uL (ref 1.5–6.5)
NEUT%: 62.9 % (ref 38.4–76.8)
Platelets: 230 10*3/uL (ref 145–400)
RBC: 4.65 10*6/uL (ref 3.70–5.45)
RDW: 13.2 % (ref 11.2–14.5)
WBC: 6.7 10*3/uL (ref 3.9–10.3)
lymph#: 1.5 10*3/uL (ref 0.9–3.3)

## 2015-11-24 LAB — MAGNESIUM: Magnesium: 2.2 mg/dl (ref 1.5–2.5)

## 2015-11-24 MED ORDER — INFLUENZA VAC SPLIT QUAD 0.5 ML IM SUSY
0.5000 mL | PREFILLED_SYRINGE | Freq: Once | INTRAMUSCULAR | Status: AC
  Administered 2015-11-24: 0.5 mL via INTRAMUSCULAR
  Filled 2015-11-24: qty 0.5

## 2015-11-24 NOTE — Progress Notes (Signed)
[facility] called with potassium refill signed by Dr. Benay Spice d/t fax number of [phone] not working at this time.

## 2015-11-24 NOTE — Telephone Encounter (Signed)
Gave patient avs report and appointments for January  °

## 2015-11-24 NOTE — Progress Notes (Signed)
  [facility] OFFICE PROGRESS NOTE   Diagnosis: CML   INTERVAL HISTORY:   Ms. Goodemote returns as scheduled. She continues Nilotinib. She overall feels well. No nausea or vomiting. No mouth sores. No diarrhea. No rash. No shortness of breath.  Objective:  Vital signs in last 24 hours:  Blood pressure (!) 162/74, pulse 64, temperature 97.8 F (36.6 C), temperature source Oral, resp. rate 18, height 5\' 3"  (1.6 m), weight 123 lb 11.2 oz (56.1 kg), SpO2 100 %.    HEENT: No thrush or ulcers. Lymphatics: No palpable cervical or supraclavicular lymph nodes. Resp: Lungs clear bilaterally. Cardio: Regular rate and rhythm. GI: Abdomen soft and nontender. No hepatomegaly. No splenomegaly. Vascular: No leg edema. Skin: No rash.    Lab Results:  Lab Results  Component Value Date   WBC 6.7 11/24/2015   HGB 14.3 11/24/2015   HCT 41.4 11/24/2015   MCV 89.0 11/24/2015   PLT 230 11/24/2015   NEUTROABS 4.2 11/24/2015   Peripheral blood PCR on 08/25/2015 0.1826% Imaging:  No results found.  Medications: I have reviewed the patient's current medications.  Assessment/Plan: 1. Chronic myelogenous leukemia -- initially treated with hydroxyurea. She began [place] on 08/27/2009. Gleevec was placed on hold on 10/06/2009 due to poor tolerance of the 400 mg daily dose. Gleevec was restarted when she had hematologic improvement on 10/13/2009 and the dose was changed to 300 mg on 10/26/2009. The Gleevec dose was reduced to 200 mg daily due to excessive "tearing". The peripheral blood BCR/ABL returned at 16% on 04/19/2010. Gleevec was discontinued secondary to toxicity and a persistent elevation of the peripheral blood translocation . She began Nilotinib on 06/09/2011. Nilotinib placed on hold beginning 08/04/2011 due to fatigue/dyspnea on exertion and slight QT prolongation. Nilotinib was resumed on 08/15/2011 at a dose of 300 mg daily. Peripheral blood PCR was negative on 12/27/2011 and  05/14/2012. Mild elevation of the peripheral blood PCR on 10/02/2012. Improved on repeat peripheral blood PCR 01/01/2013. Stable 11/25/2014. Improved 05/26/2015. Stable 08/25/2015. 2. Diarrhea. Intermittent, mild. 3. Periorbital edema -improved since stopping Gleevec. 4. Increased "tearing"-improved with discontinuance of Gleevec.  5. History of a fine papular rash over the trunk and legs-likely secondary to the nilotinib.  6. Mild QT prolongation and multifocal atrial pacemaker on EKG 08/04/2011. QT normal on EKG 08/26/2014. QTc normal 05/26/2015 and 08/25/2015. 7. History of mild hypokalemia -maintained on oral potassium replacement. Potassium in normal range on labs today. 8. Hypertension   Disposition: Ms. Lewi returns as scheduled. She continues Nilotinib. We will follow-up on the peripheral blood PCR from today. She will return for a follow-up visit, labs and EKG in 3 months.  Plan reviewed with Dr. Benay Spice.  [name] ANP/GNP-BC   11/24/2015  10:36 AM

## 2015-12-04 ENCOUNTER — Other Ambulatory Visit: Payer: Self-pay | Admitting: *Deleted

## 2015-12-04 DIAGNOSIS — C9210 Chronic myeloid leukemia, BCR/ABL-positive, not having achieved remission: Secondary | ICD-10-CM

## 2015-12-04 MED ORDER — NILOTINIB HCL 150 MG PO CAPS: 300.0000 mg | ORAL_CAPSULE | Freq: Every day | ORAL | 2 refills | Status: DC

## 2016-02-23 ENCOUNTER — Telehealth: Payer: Self-pay | Admitting: Oncology

## 2016-02-23 ENCOUNTER — Other Ambulatory Visit (HOSPITAL_BASED_OUTPATIENT_CLINIC_OR_DEPARTMENT_OTHER): Payer: Medicare Other

## 2016-02-23 ENCOUNTER — Ambulatory Visit (HOSPITAL_BASED_OUTPATIENT_CLINIC_OR_DEPARTMENT_OTHER): Payer: Medicare Other | Admitting: Oncology

## 2016-02-23 VITALS — BP 168/74 | HR 60 | Temp 98.1°F | Resp 18 | Ht 63.0 in | Wt 123.3 lb

## 2016-02-23 DIAGNOSIS — C9210 Chronic myeloid leukemia, BCR/ABL-positive, not having achieved remission: Secondary | ICD-10-CM

## 2016-02-23 DIAGNOSIS — C9211 Chronic myeloid leukemia, BCR/ABL-positive, in remission: Secondary | ICD-10-CM

## 2016-02-23 DIAGNOSIS — R197 Diarrhea, unspecified: Secondary | ICD-10-CM | POA: Diagnosis not present

## 2016-02-23 DIAGNOSIS — I10 Essential (primary) hypertension: Secondary | ICD-10-CM | POA: Diagnosis not present

## 2016-02-23 LAB — CBC WITH DIFFERENTIAL/PLATELET
BASO%: 1 % (ref 0.0–2.0)
Basophils Absolute: 0.1 10*3/uL (ref 0.0–0.1)
EOS%: 4.6 % (ref 0.0–7.0)
Eosinophils Absolute: 0.3 10*3/uL (ref 0.0–0.5)
HCT: 41.8 % (ref 34.8–46.6)
HGB: 14.5 g/dL (ref 11.6–15.9)
LYMPH%: 23.7 % (ref 14.0–49.7)
MCH: 31.2 pg (ref 25.1–34.0)
MCHC: 34.6 g/dL (ref 31.5–36.0)
MCV: 90.3 fL (ref 79.5–101.0)
MONO#: 0.5 10*3/uL (ref 0.1–0.9)
MONO%: 8.2 % (ref 0.0–14.0)
NEUT#: 4.1 10*3/uL (ref 1.5–6.5)
NEUT%: 62.5 % (ref 38.4–76.8)
Platelets: 238 10*3/uL (ref 145–400)
RBC: 4.63 10*6/uL (ref 3.70–5.45)
RDW: 13.2 % (ref 11.2–14.5)
WBC: 6.5 10*3/uL (ref 3.9–10.3)
lymph#: 1.5 10*3/uL (ref 0.9–3.3)

## 2016-02-23 LAB — COMPREHENSIVE METABOLIC PANEL
ALT: 19 U/L (ref 0–55)
AST: 20 U/L (ref 5–34)
Albumin: 4.2 g/dL (ref 3.5–5.0)
Alkaline Phosphatase: 41 U/L (ref 40–150)
Anion Gap: 8 mEq/L (ref 3–11)
BUN: 15.1 mg/dL (ref 7.0–26.0)
CO2: 27 mEq/L (ref 22–29)
Calcium: 9.9 mg/dL (ref 8.4–10.4)
Chloride: 106 mEq/L (ref 98–109)
Creatinine: 1.1 mg/dL (ref 0.6–1.1)
EGFR: 49 mL/min/{1.73_m2} — ABNORMAL LOW (ref >90)
Glucose: 98 mg/dl (ref 70–140)
Potassium: 4.2 mEq/L (ref 3.5–5.1)
Sodium: 140 mEq/L (ref 136–145)
Total Bilirubin: 1.11 mg/dL (ref 0.20–1.20)
Total Protein: 7.3 g/dL (ref 6.4–8.3)

## 2016-02-23 NOTE — Progress Notes (Signed)
  [name] OFFICE PROGRESS NOTE   Diagnosis: CML  INTERVAL HISTORY:   [name] returns as scheduled. nilotinib. She complains of alopecia. No rashes or swelling. No recent infection. She has occasional diarrhea, but this is not consistent.  Objective:  Vital signs in last 24 hours:  Blood pressure (!) 168/74, pulse 60, temperature 98.1 F (36.7 C), temperature source Oral, resp. rate 18, height 5\' 3"  (1.6 m), weight 123 lb 4.8 oz (55.9 kg), SpO2 100 %.    HEENT: No thrush or ulcers Resp: Lungs clear bilaterally Cardio: Regular rate and rhythm, 2/6 systolic murmur GI: No hepatosplenomegaly Vascular: No leg edema     Lab Results:  Lab Results  Component Value Date   WBC 6.5 02/23/2016   HGB 14.5 02/23/2016   HCT 41.8 02/23/2016   MCV 90.3 02/23/2016   PLT 238 02/23/2016   NEUTROABS 4.1 02/23/2016   Peripheral blood BCR/ABL 11/24/2015-0.083%  EKG 02/23/2016-sinus bradycardia, QT-476, QTc 455 Medications: I have reviewed the patient's current medications.  Assessment/Plan: 1. Chronic myelogenous leukemia -- initially treated with hydroxyurea. She began [place] on 08/27/2009. Gleevec was placed on hold on 10/06/2009 due to poor tolerance of the 400 mg daily dose. Gleevec was restarted when she had hematologic improvement on 10/13/2009 and the dose was changed to 300 mg on 10/26/2009. The Gleevec dose was reduced to 200 mg daily due to excessive "tearing". The peripheral blood BCR/ABL returned at 16% on 04/19/2010. Gleevec was discontinued secondary to toxicity and a persistent elevation of the peripheral blood translocation . She began Nilotinib on 06/09/2011. Nilotinib placed on hold beginning 08/04/2011 due to fatigue/dyspnea on exertion and slight QT prolongation. Nilotinib was resumed on 08/15/2011 at a dose of 300 mg daily. Peripheral blood PCR was negative on 12/27/2011 and 05/14/2012. Mild elevation of the peripheral blood PCR on 10/02/2012. Improved on  repeat peripheral blood PCR 01/01/2013. Stable 11/25/2014. Improved 05/26/2015. Stable 08/25/2015. Improved 11/24/2015. 2. Diarrhea. Intermittent, mild. 3. Periorbital edema -improved since stopping Gleevec. 4. Increased "tearing"-improved with discontinuance of Gleevec.  5. History of a fine papular rash over the trunk and legs-likely secondary to the nilotinib.  6. Mild QT prolongation and multifocal atrial pacemaker on EKG 08/04/2011. QT normal on EKG 08/26/2014. QTc normal 05/26/2015 ,08/25/2015, and 02/23/2016 7. History of mild hypokalemia -maintained on oral potassium replacement. Potassium in normal range on labs today. 8. Hypertension   Disposition:  She remains in hematologic and molecular remission from CML. She will continue nilotinib. It is possible the mild alopecia is related to nilotinib, but she is maintained on this medication for 5 years and alopecia has not been a chronic complaint.  [name] will return for an office and lab visit in 3 months.  15 minutes were spent with the patient today. The majority of the time was used for counseling and coordinate of care.  Betsy Coder, MD  02/23/2016  12:59 PM

## 2016-02-23 NOTE — Telephone Encounter (Signed)
Appointments scheduled per 02/23/16 los. Patient was given a copy of the appointment schedule and AVS report, per 02/23/16 los.

## 2016-03-02 ENCOUNTER — Other Ambulatory Visit: Payer: Self-pay | Admitting: *Deleted

## 2016-03-02 DIAGNOSIS — C9210 Chronic myeloid leukemia, BCR/ABL-positive, not having achieved remission: Secondary | ICD-10-CM

## 2016-03-07 MED ORDER — NILOTINIB HCL 150 MG PO CAPS: 300.0000 mg | ORAL_CAPSULE | Freq: Every day | ORAL | 2 refills | Status: DC

## 2016-03-28 ENCOUNTER — Other Ambulatory Visit: Payer: Self-pay | Admitting: Oncology

## 2016-03-28 DIAGNOSIS — C9210 Chronic myeloid leukemia, BCR/ABL-positive, not having achieved remission: Secondary | ICD-10-CM

## 2016-04-07 ENCOUNTER — Other Ambulatory Visit: Payer: Self-pay | Admitting: Nurse Practitioner

## 2016-04-07 DIAGNOSIS — C9210 Chronic myeloid leukemia, BCR/ABL-positive, not having achieved remission: Secondary | ICD-10-CM

## 2016-04-07 MED ORDER — POTASSIUM CHLORIDE CRYS ER 20 MEQ PO TBCR: 20.0000 meq | EXTENDED_RELEASE_TABLET | Freq: Every day | ORAL | 0 refills | Status: DC

## 2016-05-24 ENCOUNTER — Ambulatory Visit (HOSPITAL_BASED_OUTPATIENT_CLINIC_OR_DEPARTMENT_OTHER): Payer: Medicare Other | Admitting: Nurse Practitioner

## 2016-05-24 ENCOUNTER — Other Ambulatory Visit (HOSPITAL_BASED_OUTPATIENT_CLINIC_OR_DEPARTMENT_OTHER): Payer: Medicare Other

## 2016-05-24 ENCOUNTER — Telehealth: Payer: Self-pay | Admitting: Nurse Practitioner

## 2016-05-24 VITALS — BP 158/68 | HR 62 | Temp 98.2°F | Resp 18 | Ht 63.0 in | Wt 124.0 lb

## 2016-05-24 DIAGNOSIS — C9210 Chronic myeloid leukemia, BCR/ABL-positive, not having achieved remission: Secondary | ICD-10-CM

## 2016-05-24 DIAGNOSIS — C9211 Chronic myeloid leukemia, BCR/ABL-positive, in remission: Secondary | ICD-10-CM | POA: Diagnosis not present

## 2016-05-24 DIAGNOSIS — I10 Essential (primary) hypertension: Secondary | ICD-10-CM

## 2016-05-24 LAB — COMPREHENSIVE METABOLIC PANEL
ALT: 19 U/L (ref 0–55)
AST: 17 U/L (ref 5–34)
Albumin: 4 g/dL (ref 3.5–5.0)
Alkaline Phosphatase: 40 U/L (ref 40–150)
Anion Gap: 10 mEq/L (ref 3–11)
BUN: 17.8 mg/dL (ref 7.0–26.0)
CO2: 25 mEq/L (ref 22–29)
Calcium: 10 mg/dL (ref 8.4–10.4)
Chloride: 107 mEq/L (ref 98–109)
Creatinine: 1.1 mg/dL (ref 0.6–1.1)
EGFR: 47 mL/min/{1.73_m2} — ABNORMAL LOW (ref >90)
Glucose: 107 mg/dl (ref 70–140)
Potassium: 4.6 mEq/L (ref 3.5–5.1)
Sodium: 142 mEq/L (ref 136–145)
Total Bilirubin: 0.87 mg/dL (ref 0.20–1.20)
Total Protein: 7 g/dL (ref 6.4–8.3)

## 2016-05-24 LAB — CBC WITH DIFFERENTIAL/PLATELET
BASO%: 0.9 % (ref 0.0–2.0)
Basophils Absolute: 0.1 10*3/uL (ref 0.0–0.1)
EOS%: 6.3 % (ref 0.0–7.0)
Eosinophils Absolute: 0.4 10*3/uL (ref 0.0–0.5)
HCT: 41.7 % (ref 34.8–46.6)
HGB: 14.3 g/dL (ref 11.6–15.9)
LYMPH%: 22.6 % (ref 14.0–49.7)
MCH: 30.7 pg (ref 25.1–34.0)
MCHC: 34.3 g/dL (ref 31.5–36.0)
MCV: 89.6 fL (ref 79.5–101.0)
MONO#: 0.5 10*3/uL (ref 0.1–0.9)
MONO%: 8.7 % (ref 0.0–14.0)
NEUT#: 3.7 10*3/uL (ref 1.5–6.5)
NEUT%: 61.5 % (ref 38.4–76.8)
Platelets: 239 10*3/uL (ref 145–400)
RBC: 4.65 10*6/uL (ref 3.70–5.45)
RDW: 13.1 % (ref 11.2–14.5)
WBC: 5.9 10*3/uL (ref 3.9–10.3)
lymph#: 1.3 10*3/uL (ref 0.9–3.3)

## 2016-05-24 NOTE — Telephone Encounter (Signed)
Appointments scheduled per 4.17.18 LOS. Patient given AVS report and calendars with future scheduled appointments. °

## 2016-05-24 NOTE — Progress Notes (Signed)
  [facility] OFFICE PROGRESS NOTE   Diagnosis:  CML  INTERVAL HISTORY:   [name] returns as scheduled. She continues Nilotinib. She has periodic loose stools. She has occasional nausea. No rash. No shortness breath. No interim illnesses or infections.  Objective:  Vital signs in last 24 hours:  Blood pressure (!) 158/68, pulse 62, temperature 98.2 F (36.8 C), temperature source Oral, resp. rate 18, height 5\' 3"  (1.6 m), weight 124 lb (56.2 kg), SpO2 100 %.    HEENT: No thrush or ulcers. Resp: Lungs clear bilaterally. Cardio: Regular rate and rhythm. GI: Abdomen soft and nontender. No organomegaly. Vascular: No leg edema. Skin: No rash.    Lab Results:  Lab Results  Component Value Date   WBC 5.9 05/24/2016   HGB 14.3 05/24/2016   HCT 41.7 05/24/2016   MCV 89.6 05/24/2016   PLT 239 05/24/2016   NEUTROABS 3.7 05/24/2016    Imaging:  No results found.  Medications: I have reviewed the patient's current medications.  Assessment/Plan: 1. Chronic myelogenous leukemia -- initially treated with hydroxyurea. She began [employer] on 08/27/2009. Gleevec was placed on hold on 10/06/2009 due to poor tolerance of the 400 mg daily dose. Gleevec was restarted when she had hematologic improvement on 10/13/2009 and the dose was changed to 300 mg on 10/26/2009. The Gleevec dose was reduced to 200 mg daily due to excessive "tearing". The peripheral blood BCR/ABL returned at 16% on 04/19/2010. Gleevec was discontinued secondary to toxicity and a persistent elevation of the peripheral blood translocation . She began Nilotinib on 06/09/2011. Nilotinib placed on hold beginning 08/04/2011 due to fatigue/dyspnea on exertion and slight QT prolongation. Nilotinib was resumed on 08/15/2011 at a dose of 300 mg daily. Peripheral blood PCR was negative on 12/27/2011 and 05/14/2012. Mild elevation of the peripheral blood PCR on 10/02/2012. Improved on repeat peripheral blood PCR  01/01/2013. Stable 11/25/2014. Improved 05/26/2015.Stable 08/25/2015. Improved 11/24/2015. 2. Diarrhea. Intermittent, mild. 3. Periorbital edema -improved since stopping Gleevec. 4. Increased "tearing"-improved with discontinuance of Gleevec.  5. History of a fine papular rash over the trunk and legs-likely secondary to the nilotinib.  6. Mild QT prolongation and multifocal atrial pacemaker on EKG 08/04/2011. QT normal on EKG 08/26/2014. QTc normal 05/26/2015 ,08/25/2015, and 02/23/2016 7. History of mild hypokalemia -maintained on oral potassium replacement. Potassium in normal range on labs today. 8. Hypertension   Disposition: [name] appears stable. She will continue Nilotinib. We will follow-up on the peripheral blood PCR from today. She will return for a follow-up visit, EKG and labs in 3 months. She will contact the office in the interim with any problems.  Plan reviewed with Dr. Benay Spice.  [name] ANP/GNP-BC   05/24/2016  11:36 AM

## 2016-06-20 ENCOUNTER — Other Ambulatory Visit: Payer: Self-pay | Admitting: *Deleted

## 2016-06-20 ENCOUNTER — Other Ambulatory Visit: Payer: Self-pay | Admitting: Family Medicine

## 2016-06-20 DIAGNOSIS — C9210 Chronic myeloid leukemia, BCR/ABL-positive, not having achieved remission: Secondary | ICD-10-CM

## 2016-06-20 DIAGNOSIS — Z1231 Encounter for screening mammogram for malignant neoplasm of breast: Secondary | ICD-10-CM

## 2016-06-20 NOTE — Telephone Encounter (Signed)
Telephone call from patient- she has not been able to get potassium refilled from [facility]. She states she has called them numerous times and they respond by saying they will send it but it will not get delivered to her. Pt is requesting a refill to be sent to [facility]. She has been without potassium since 4/10 (roughly). Request forwarded to [name], NP for approval. Medication refill pending.

## 2016-07-14 ENCOUNTER — Other Ambulatory Visit: Payer: Self-pay | Admitting: *Deleted

## 2016-07-14 DIAGNOSIS — C9210 Chronic myeloid leukemia, BCR/ABL-positive, not having achieved remission: Secondary | ICD-10-CM

## 2016-07-14 MED ORDER — POTASSIUM CHLORIDE CRYS ER 20 MEQ PO TBCR: 20.0000 meq | EXTENDED_RELEASE_TABLET | Freq: Every day | ORAL | 0 refills | Status: DC

## 2016-08-02 ENCOUNTER — Ambulatory Visit
Admission: RE | Admit: 2016-08-02 | Discharge: 2016-08-02 | Disposition: A | Discharge status: Home / Self Care | Payer: Medicare Other | Source: Ambulatory Visit | Attending: Family Medicine | Admitting: Family Medicine

## 2016-08-02 DIAGNOSIS — Z1231 Encounter for screening mammogram for malignant neoplasm of breast: Secondary | ICD-10-CM

## 2016-08-10 ENCOUNTER — Other Ambulatory Visit: Payer: Self-pay | Admitting: Oncology

## 2016-08-10 DIAGNOSIS — C9210 Chronic myeloid leukemia, BCR/ABL-positive, not having achieved remission: Secondary | ICD-10-CM

## 2016-08-30 ENCOUNTER — Ambulatory Visit: Payer: Medicare Other | Admitting: Nurse Practitioner

## 2016-08-30 ENCOUNTER — Other Ambulatory Visit: Payer: Medicare Other

## 2016-09-13 ENCOUNTER — Other Ambulatory Visit (HOSPITAL_BASED_OUTPATIENT_CLINIC_OR_DEPARTMENT_OTHER): Payer: Medicare Other

## 2016-09-13 ENCOUNTER — Telehealth: Payer: Self-pay | Admitting: Oncology

## 2016-09-13 ENCOUNTER — Ambulatory Visit (HOSPITAL_BASED_OUTPATIENT_CLINIC_OR_DEPARTMENT_OTHER): Payer: Medicare Other | Admitting: Nurse Practitioner

## 2016-09-13 VITALS — BP 154/63 | HR 65 | Temp 98.6°F | Resp 18 | Ht 63.0 in | Wt 122.1 lb

## 2016-09-13 DIAGNOSIS — C9211 Chronic myeloid leukemia, BCR/ABL-positive, in remission: Secondary | ICD-10-CM

## 2016-09-13 DIAGNOSIS — C9210 Chronic myeloid leukemia, BCR/ABL-positive, not having achieved remission: Secondary | ICD-10-CM

## 2016-09-13 LAB — CBC WITH DIFFERENTIAL/PLATELET
BASO%: 0.9 % (ref 0.0–2.0)
Basophils Absolute: 0.1 10*3/uL (ref 0.0–0.1)
EOS%: 4.7 % (ref 0.0–7.0)
Eosinophils Absolute: 0.3 10*3/uL (ref 0.0–0.5)
HCT: 38.4 % (ref 34.8–46.6)
HGB: 13 g/dL (ref 11.6–15.9)
LYMPH%: 23.1 % (ref 14.0–49.7)
MCH: 30.6 pg (ref 25.1–34.0)
MCHC: 33.9 g/dL (ref 31.5–36.0)
MCV: 90.4 fL (ref 79.5–101.0)
MONO#: 0.5 10*3/uL (ref 0.1–0.9)
MONO%: 9.7 % (ref 0.0–14.0)
NEUT#: 3.4 10*3/uL (ref 1.5–6.5)
NEUT%: 61.6 % (ref 38.4–76.8)
Platelets: 244 10*3/uL (ref 145–400)
RBC: 4.25 10*6/uL (ref 3.70–5.45)
RDW: 13.7 % (ref 11.2–14.5)
WBC: 5.6 10*3/uL (ref 3.9–10.3)
lymph#: 1.3 10*3/uL (ref 0.9–3.3)
nRBC: 0 % (ref 0–0)

## 2016-09-13 LAB — COMPREHENSIVE METABOLIC PANEL
ALT: 15 U/L (ref 0–55)
AST: 16 U/L (ref 5–34)
Albumin: 3.8 g/dL (ref 3.5–5.0)
Alkaline Phosphatase: 41 U/L (ref 40–150)
Anion Gap: 7 mEq/L (ref 3–11)
BUN: 18.5 mg/dL (ref 7.0–26.0)
CO2: 25 mEq/L (ref 22–29)
Calcium: 9.6 mg/dL (ref 8.4–10.4)
Chloride: 106 mEq/L (ref 98–109)
Creatinine: 1.2 mg/dL — ABNORMAL HIGH (ref 0.6–1.1)
EGFR: 43 mL/min/{1.73_m2} — ABNORMAL LOW (ref >90)
Glucose: 113 mg/dl (ref 70–140)
Potassium: 4.1 mEq/L (ref 3.5–5.1)
Sodium: 139 mEq/L (ref 136–145)
Total Bilirubin: 1.13 mg/dL (ref 0.20–1.20)
Total Protein: 6.7 g/dL (ref 6.4–8.3)

## 2016-09-13 NOTE — Progress Notes (Addendum)
  [place] OFFICE PROGRESS NOTE   Diagnosis:  AML  INTERVAL HISTORY:   [name] returns as scheduled. She continues Nilotinib. She feels well overall. She has occasional mild nausea. No diarrhea. No rash. No shortness of breath. No leg swelling. She has a good appetite.  Objective:  Vital signs in last 24 hours:  Blood pressure (!) 154/63, pulse 65, temperature 98.6 F (37 C), temperature source Oral, resp. rate 18, height 5\' 3"  (1.6 m), weight 122 lb 1.6 oz (55.4 kg), SpO2 99 %.    HEENT: No thrush or ulcers. Mild periorbital edema. Lymphatics: No palpable cervical, supraclavicular or axillary lymph nodes. Resp: Lungs clear bilaterally. Cardio: Regular rate and rhythm. GI: Abdomen soft and nontender. No hepatosplenomegaly. Vascular: No leg edema. Calves soft and nontender. Skin: No rash.    Lab Results:  Lab Results  Component Value Date   WBC 5.6 09/13/2016   HGB 13.0 09/13/2016   HCT 38.4 09/13/2016   MCV 90.4 09/13/2016   PLT 244 09/13/2016   NEUTROABS 3.4 09/13/2016    Imaging:  No results found.  Medications: I have reviewed the patient's current medications.  Assessment/Plan: 1. Chronic myelogenous leukemia -- initially treated with hydroxyurea. She began [place] on 08/27/2009. Gleevec was placed on hold on 10/06/2009 due to poor tolerance of the 400 mg daily dose. Gleevec was restarted when she had hematologic improvement on 10/13/2009 and the dose was changed to 300 mg on 10/26/2009. The Gleevec dose was reduced to 200 mg daily due to excessive "tearing". The peripheral blood BCR/ABL returned at 16% on 04/19/2010. Gleevec was discontinued secondary to toxicity and a persistent elevation of the peripheral blood translocation . She began Nilotinib on 06/09/2011. Nilotinib placed on hold beginning 08/04/2011 due to fatigue/dyspnea on exertion and slight QT prolongation. Nilotinib was resumed on 08/15/2011 at a dose of 300 mg daily. Peripheral  blood PCR was negative on 12/27/2011 and 05/14/2012. Mild elevation of the peripheral blood PCR on 10/02/2012. Improved on repeat peripheral blood PCR 01/01/2013. Stable 11/25/2014. Improved 05/26/2015.Stable 08/25/2015.Improved 11/24/2015. 2. Diarrhea. Intermittent, mild. 3. Periorbital edema -improved since stopping Gleevec. 4. Increased "tearing"-improved with discontinuance of Gleevec.  5. History of a fine papular rash over the trunk and legs-likely secondary to the nilotinib.  6. Mild QT prolongation and multifocal atrial pacemaker on EKG 08/04/2011. QT normal on EKG 08/26/2014. QTc normal 05/26/2015,08/25/2015, and 02/23/2016 7. History of mild hypokalemia -maintained on oral potassium replacement. Potassium in normal range on labs today. 8. Hypertension   Disposition: [name] appears stable. She will continue Nilotinib. We reviewed the result of the peripheral blood PCR from April. She understands it was slightly higher. We will repeat when she returns for her next visit. She will return for a follow-up visit and labs in 3 months. She will contact the office in the interim with any problems.  Patient seen with Dr. Benay Spice.  [name] ANP/GNP-BC   09/13/2016  11:15 AM  This was a shared visit with [name]. [name] remains in clinical remission from the [facility]. She will continue Nilotinib. We will consider a treatment change for a progressive rise in the peripheral blood PCR.  Julieanne Manson, M.D.

## 2016-09-13 NOTE — Telephone Encounter (Signed)
Gave patient avs form and calendar for November.

## 2016-10-09 ENCOUNTER — Other Ambulatory Visit: Payer: Self-pay | Admitting: Oncology

## 2016-10-09 DIAGNOSIS — C9210 Chronic myeloid leukemia, BCR/ABL-positive, not having achieved remission: Secondary | ICD-10-CM

## 2016-10-17 ENCOUNTER — Other Ambulatory Visit: Payer: Self-pay | Admitting: Oncology

## 2016-10-17 DIAGNOSIS — C9210 Chronic myeloid leukemia, BCR/ABL-positive, not having achieved remission: Secondary | ICD-10-CM

## 2016-12-12 ENCOUNTER — Other Ambulatory Visit: Payer: Self-pay | Admitting: Oncology

## 2016-12-12 DIAGNOSIS — C9210 Chronic myeloid leukemia, BCR/ABL-positive, not having achieved remission: Secondary | ICD-10-CM

## 2016-12-13 ENCOUNTER — Other Ambulatory Visit (HOSPITAL_BASED_OUTPATIENT_CLINIC_OR_DEPARTMENT_OTHER): Payer: Medicare Other

## 2016-12-13 ENCOUNTER — Ambulatory Visit: Payer: Medicare Other | Admitting: Oncology

## 2016-12-13 ENCOUNTER — Telehealth: Payer: Self-pay | Admitting: Nurse Practitioner

## 2016-12-13 VITALS — BP 157/76 | HR 90 | Temp 98.6°F | Resp 18 | Ht 63.0 in | Wt 124.7 lb

## 2016-12-13 DIAGNOSIS — I10 Essential (primary) hypertension: Secondary | ICD-10-CM

## 2016-12-13 DIAGNOSIS — C9210 Chronic myeloid leukemia, BCR/ABL-positive, not having achieved remission: Secondary | ICD-10-CM

## 2016-12-13 DIAGNOSIS — Z23 Encounter for immunization: Secondary | ICD-10-CM | POA: Diagnosis not present

## 2016-12-13 DIAGNOSIS — C9211 Chronic myeloid leukemia, BCR/ABL-positive, in remission: Secondary | ICD-10-CM | POA: Diagnosis not present

## 2016-12-13 LAB — MAGNESIUM: Magnesium: 2.5 mg/dl (ref 1.5–2.5)

## 2016-12-13 LAB — COMPREHENSIVE METABOLIC PANEL
ALT: 18 U/L (ref 0–55)
AST: 17 U/L (ref 5–34)
Albumin: 4 g/dL (ref 3.5–5.0)
Alkaline Phosphatase: 38 U/L — ABNORMAL LOW (ref 40–150)
Anion Gap: 8 mEq/L (ref 3–11)
BUN: 15.9 mg/dL (ref 7.0–26.0)
CO2: 26 mEq/L (ref 22–29)
Calcium: 9.6 mg/dL (ref 8.4–10.4)
Chloride: 107 mEq/L (ref 98–109)
Creatinine: 1 mg/dL (ref 0.6–1.1)
EGFR: 50 mL/min/{1.73_m2} — ABNORMAL LOW (ref >60)
Glucose: 102 mg/dl (ref 70–140)
Potassium: 3.8 mEq/L (ref 3.5–5.1)
Sodium: 141 mEq/L (ref 136–145)
Total Bilirubin: 0.79 mg/dL (ref 0.20–1.20)
Total Protein: 7.1 g/dL (ref 6.4–8.3)

## 2016-12-13 LAB — CBC WITH DIFFERENTIAL/PLATELET
BASO%: 0.7 % (ref 0.0–2.0)
Basophils Absolute: 0 10*3/uL (ref 0.0–0.1)
EOS%: 5.9 % (ref 0.0–7.0)
Eosinophils Absolute: 0.3 10*3/uL (ref 0.0–0.5)
HCT: 39.9 % (ref 34.8–46.6)
HGB: 13.7 g/dL (ref 11.6–15.9)
LYMPH%: 21.5 % (ref 14.0–49.7)
MCH: 31.1 pg (ref 25.1–34.0)
MCHC: 34.3 g/dL (ref 31.5–36.0)
MCV: 90.5 fL (ref 79.5–101.0)
MONO#: 0.5 10*3/uL (ref 0.1–0.9)
MONO%: 8.7 % (ref 0.0–14.0)
NEUT#: 3.7 10*3/uL (ref 1.5–6.5)
NEUT%: 63.2 % (ref 38.4–76.8)
Platelets: 211 10*3/uL (ref 145–400)
RBC: 4.41 10*6/uL (ref 3.70–5.45)
RDW: 13 % (ref 11.2–14.5)
WBC: 5.8 10*3/uL (ref 3.9–10.3)
lymph#: 1.2 10*3/uL (ref 0.9–3.3)

## 2016-12-13 MED ORDER — INFLUENZA VAC SPLIT HIGH-DOSE 0.5 ML IM SUSY
0.5000 mL | PREFILLED_SYRINGE | Freq: Once | INTRAMUSCULAR | Status: AC
  Administered 2016-12-13: 0.5 mL via INTRAMUSCULAR
  Filled 2016-12-13: qty 0.5

## 2016-12-13 NOTE — Progress Notes (Signed)
[place] OFFICE PROGRESS NOTE   Diagnosis: CML  INTERVAL HISTORY:   [name] turns as scheduled.  She continues nilotinib.  She reports one diarrhea stool in the mornings.  She has intermittent headaches during the night.  She is not taking pain medication.  She reports malaise.  Objective:  Vital signs in last 24 hours:  Blood pressure (!) 157/76, pulse 90, temperature 98.6 F (37 C), temperature source Oral, resp. rate 18, height _0  (1.6 m), weight 124 lb 11.2 oz (56.6 kg), SpO2 100 %.    HEENT: No thrush or ulcers.  Mild periorbital edema Resp: Clear bilaterally Cardio: Regular rate and rhythm GI: No hepatosplenomegaly Vascular: No leg edema  Skin: Rash    Lab Results:  Lab Results  Component Value Date   WBC 5.8 12/13/2016   HGB 13.7 12/13/2016   HCT 39.9 12/13/2016   MCV 90.5 12/13/2016   PLT 211 12/13/2016   NEUTROABS 3.7 12/13/2016    CMP     Component Value Date/Time   NA 141 12/13/2016 0948   K 3.8 12/13/2016 0948   CL 107 05/14/2012 0958   CO2 26 12/13/2016 0948   GLUCOSE 102 12/13/2016 0948   GLUCOSE 76 05/14/2012 0958   BUN 15.9 12/13/2016 0948   CREATININE 1.0 12/13/2016 0948   CALCIUM 9.6 12/13/2016 0948   PROT 7.1 12/13/2016 0948   ALBUMIN 4.0 12/13/2016 0948   AST 17 12/13/2016 0948   ALT 18 12/13/2016 0948   ALKPHOS 38 (L) 12/13/2016 0948   BILITOT 0.79 12/13/2016 0948   GFRNONAA >60 12/24/2007 1004   GFRAA  12/24/2007 1004    >60        The eGFR has been calculated using the MDRD equation. This calculation has not been validated in all clinical    Peripheral blood PCR on 05/24/2016: 0.207%  Medications: I have reviewed the patient's current medications.  Assessment/Plan: 1. Chronic myelogenous leukemia -- initially treated with hydroxyurea. She began [place] on 08/27/2009. Gleevec was placed on hold on 10/06/2009 due to poor tolerance of the 400 mg daily dose. Gleevec was restarted when she had hematologic  improvement on 10/13/2009 and the dose was changed to 300 mg on 10/26/2009. The Gleevec dose was reduced to 200 mg daily due to excessive "tearing". The peripheral blood BCR/ABL returned at 16% on 04/19/2010. Gleevec was discontinued secondary to toxicity and a persistent elevation of the peripheral blood translocation . She began Nilotinib on 06/09/2011. Nilotinib placed on hold beginning 08/04/2011 due to fatigue/dyspnea on exertion and slight QT prolongation. Nilotinib was resumed on 08/15/2011 at a dose of 300 mg daily. Peripheral blood PCR was negative on 12/27/2011 and 05/14/2012. Mild elevation of the peripheral blood PCR on 10/02/2012. Improved on repeat peripheral blood PCR 01/01/2013. Stable 11/25/2014. Improved 05/26/2015.Stable 08/25/2015.Improved 11/24/2015. 2. Diarrhea. Intermittent, mild. 3. Periorbital edema -improved since stopping Gleevec. 4. Increased "tearing"-improved with discontinuance of Gleevec.  5. History of a fine papular rash over the trunk and legs-likely secondary to the nilotinib.  6. Mild QT prolongation and multifocal atrial pacemaker on EKG 08/04/2011. QT normal on EKG 08/26/2014. QTc normal 05/26/2015,08/25/2015, and 02/23/2016 7. History of mild hypokalemia -maintained on oral potassium replacement. Potassium in normal range on labs today. 8. Hypertension   Disposition:  [name] appears stable.  She will continue nilotinib we will follow-up on the peripheral blood PCR from today.  She will return for an office and lab visit in 3 months.  She received an influenza  vaccine today. She will contact us for increased headaches.  15 minutes were spent with the patient today.  The majority of the time was used for counseling and coordination of care.  Donneta Romberg, MD  12/13/2016  10:43 AM

## 2016-12-13 NOTE — Telephone Encounter (Signed)
Gave avs and calendar for February 2019 °

## 2017-01-17 ENCOUNTER — Other Ambulatory Visit: Payer: Self-pay | Admitting: Oncology

## 2017-01-17 DIAGNOSIS — C9210 Chronic myeloid leukemia, BCR/ABL-positive, not having achieved remission: Secondary | ICD-10-CM

## 2017-02-20 ENCOUNTER — Telehealth: Payer: Self-pay | Admitting: *Deleted

## 2017-02-20 NOTE — Telephone Encounter (Signed)
I was able to download the [employer] form. Confirmed with pt via telephone call her patient ID ([id number]) and [employer] address and fax info.

## 2017-02-20 NOTE — Telephone Encounter (Signed)
Pt reports she received a letter from [employer] stating her provider needs to complete a provider statement form"to verify diagnosis and treatment". The form was not included in her letter.  Per pt, form should be faxed to [employer] at [phone]. Will follow up with managed care re: pt assistance form.

## 2017-03-14 ENCOUNTER — Telehealth: Payer: Self-pay | Admitting: Nurse Practitioner

## 2017-03-14 ENCOUNTER — Inpatient Hospital Stay: Payer: Medicare Other

## 2017-03-14 ENCOUNTER — Inpatient Hospital Stay: Payer: Medicare Other | Attending: Nurse Practitioner | Admitting: Nurse Practitioner

## 2017-03-14 ENCOUNTER — Encounter: Payer: Self-pay | Admitting: Nurse Practitioner

## 2017-03-14 VITALS — BP 145/69 | HR 63 | Temp 98.6°F | Resp 18 | Ht 63.0 in | Wt 122.4 lb

## 2017-03-14 DIAGNOSIS — C9210 Chronic myeloid leukemia, BCR/ABL-positive, not having achieved remission: Secondary | ICD-10-CM

## 2017-03-14 DIAGNOSIS — R197 Diarrhea, unspecified: Secondary | ICD-10-CM | POA: Diagnosis not present

## 2017-03-14 DIAGNOSIS — I10 Essential (primary) hypertension: Secondary | ICD-10-CM

## 2017-03-14 DIAGNOSIS — Z950 Presence of cardiac pacemaker: Secondary | ICD-10-CM

## 2017-03-14 LAB — COMPREHENSIVE METABOLIC PANEL
ALT: 12 U/L (ref 0–55)
AST: 14 U/L (ref 5–34)
Albumin: 3.8 g/dL (ref 3.5–5.0)
Alkaline Phosphatase: 41 U/L (ref 40–150)
Anion gap: 10 (ref 3–11)
BUN: 16 mg/dL (ref 7–26)
CO2: 25 mmol/L (ref 22–29)
Calcium: 9.5 mg/dL (ref 8.4–10.4)
Chloride: 105 mmol/L (ref 98–109)
Creatinine, Ser: 0.99 mg/dL (ref 0.60–1.10)
GFR calc Af Amer: 60 mL/min — ABNORMAL LOW (ref >60)
GFR calc non Af Amer: 52 mL/min — ABNORMAL LOW (ref >60)
Glucose, Bld: 111 mg/dL (ref 70–140)
Potassium: 3.8 mmol/L (ref 3.5–5.1)
Sodium: 140 mmol/L (ref 136–145)
Total Bilirubin: 0.7 mg/dL (ref 0.2–1.2)
Total Protein: 7.1 g/dL (ref 6.4–8.3)

## 2017-03-14 LAB — CBC WITH DIFFERENTIAL/PLATELET
Basophils Absolute: 0.1 10*3/uL (ref 0.0–0.1)
Basophils Relative: 1 %
Eosinophils Absolute: 0.2 10*3/uL (ref 0.0–0.5)
Eosinophils Relative: 5 %
HCT: 38.6 % (ref 34.8–46.6)
Hemoglobin: 13.5 g/dL (ref 11.6–15.9)
Lymphocytes Relative: 23 %
Lymphs Abs: 1.2 10*3/uL (ref 0.9–3.3)
MCH: 31.2 pg (ref 25.1–34.0)
MCHC: 34.9 g/dL (ref 31.5–36.0)
MCV: 89.3 fL (ref 79.5–101.0)
Monocytes Absolute: 0.4 10*3/uL (ref 0.1–0.9)
Monocytes Relative: 8 %
Neutro Abs: 3.5 10*3/uL (ref 1.5–6.5)
Neutrophils Relative %: 63 %
Platelets: 262 10*3/uL (ref 145–400)
RBC: 4.33 MIL/uL (ref 3.70–5.45)
RDW: 12.6 % (ref 11.2–14.5)
WBC: 5.4 10*3/uL (ref 3.9–10.3)

## 2017-03-14 NOTE — Progress Notes (Signed)
  [place] OFFICE PROGRESS NOTE   Diagnosis: CML  INTERVAL HISTORY:   Ms. Pelphrey returns as scheduled.  She continues Nilotinib.  About 6 weeks ago she decreased the dose from 300 mg daily to 150 mg daily.  She notes that she feels better on the lower dose.  She denies nausea/vomiting.  She has occasional loose stools.  No rash.  She has an occasional headache.  Objective:  Vital signs in last 24 hours:  Blood pressure (!) 145/69, pulse 63, temperature 98.6 F (37 C), temperature source Oral, resp. rate 18, height 5\' 3"  (1.6 m), weight 122 lb 6.4 oz (55.5 kg), SpO2 100 %.    HEENT: No thrush or ulcers. Resp: Lungs clear bilaterally. Cardio: Regular rate and rhythm. GI: Abdomen soft and nontender.  No hepatosplenomegaly. Vascular: No leg edema.  Skin: No rash.   Lab Results:  Lab Results  Component Value Date   WBC 5.4 03/14/2017   HGB 13.5 03/14/2017   HCT 38.6 03/14/2017   MCV 89.3 03/14/2017   PLT 262 03/14/2017   NEUTROABS 3.5 03/14/2017   Peripheral blood PCR on 12/13/2016-0.098% Imaging:  No results found.  Medications: I have reviewed the patient's current medications.  Assessment/Plan: 1. Chronic myelogenous leukemia -- initially treated with hydroxyurea. She began [place] on 08/27/2009. Gleevec was placed on hold on 10/06/2009 due to poor tolerance of the 400 mg daily dose. Gleevec was restarted when she had hematologic improvement on 10/13/2009 and the dose was changed to 300 mg on 10/26/2009. The Gleevec dose was reduced to 200 mg daily due to excessive "tearing". The peripheral blood BCR/ABL returned at 16% on 04/19/2010. Gleevec was discontinued secondary to toxicity and a persistent elevation of the peripheral blood translocation . She began Nilotinib on 06/09/2011. Nilotinib placed on hold beginning 08/04/2011 due to fatigue/dyspnea on exertion and slight QT prolongation. Nilotinib was resumed on 08/15/2011 at a dose of 300 mg daily.  Peripheral blood PCR was negative on 12/27/2011 and 05/14/2012. Mild elevation of the peripheral blood PCR on 10/02/2012. Improved on repeat peripheral blood PCR 01/01/2013. Stable 11/25/2014. Improved 05/26/2015.Stable 08/25/2015.Improved 11/24/2015.  Patient self reduced dose of Nilotinib from 300 mg daily to 150 mg daily 2. Diarrhea. Intermittent, mild. 3. Periorbital edema -improved since stopping Gleevec. 4. Increased "tearing"-improved with discontinuance of Gleevec.  5. History of a fine papular rash over the trunk and legs-likely secondary to the nilotinib.  6. Mild QT prolongation and multifocal atrial pacemaker on EKG 08/04/2011. QT normal on EKG 08/26/2014. QTc normal 05/26/2015,08/25/2015, and 02/23/2016 7. History of mild hypokalemia -maintained on oral potassium replacement. Potassium in normal range on labs today. 8. Hypertension     Disposition: Ms. Leech appears stable.  She would like to continue Nilotinib at the decreased dose of 150 mg daily.  We will follow-up on the peripheral blood PCR from today.  She will return for a follow-up visit in 3 months.  She will contact the office in the interim with any problems.  Plan reviewed with Dr. Benay Spice.    [name] ANP/GNP-BC   03/14/2017  11:00 AM

## 2017-03-14 NOTE — Telephone Encounter (Signed)
Scheduled appt per 2/5 los - Gave patient AVS and calender per los.  

## 2017-03-29 LAB — BCR/ABL

## 2017-04-07 ENCOUNTER — Other Ambulatory Visit: Payer: Self-pay | Admitting: *Deleted

## 2017-04-07 DIAGNOSIS — C9210 Chronic myeloid leukemia, BCR/ABL-positive, not having achieved remission: Secondary | ICD-10-CM

## 2017-04-07 MED ORDER — NILOTINIB HCL 150 MG PO CAPS: 150.0000 mg | ORAL_CAPSULE | Freq: Every day | ORAL | 1 refills | Status: DC

## 2017-04-07 NOTE — Telephone Encounter (Signed)
Call from pt asking if it is OK to continue Tasigna at lower dose. Reviewed with Dr. Benay Spice: PCR looks good. OK to continue at 150mg . Pt voiced understanding. Refill sent to [facility], per her request.

## 2017-04-13 ENCOUNTER — Telehealth: Payer: Self-pay | Admitting: *Deleted

## 2017-04-13 NOTE — Telephone Encounter (Signed)
Spoke with Shanon Brow at [facility] to confirm dose of Tasigna of 150mg  daily.  Note from visit of 03/14/17 states that patient self-reduced and wants to keep it at that dose.  Shanon Brow appreciated the call back to confirm dosing.

## 2017-06-04 ENCOUNTER — Other Ambulatory Visit: Payer: Self-pay | Admitting: Oncology

## 2017-06-04 DIAGNOSIS — C9210 Chronic myeloid leukemia, BCR/ABL-positive, not having achieved remission: Secondary | ICD-10-CM

## 2017-06-13 ENCOUNTER — Telehealth: Payer: Self-pay

## 2017-06-13 ENCOUNTER — Inpatient Hospital Stay: Payer: Medicare Other

## 2017-06-13 ENCOUNTER — Other Ambulatory Visit: Payer: Self-pay

## 2017-06-13 ENCOUNTER — Inpatient Hospital Stay: Payer: Medicare Other | Attending: Nurse Practitioner | Admitting: Oncology

## 2017-06-13 VITALS — BP 164/89 | HR 65 | Temp 98.4°F | Resp 18 | Ht 63.0 in | Wt 125.4 lb

## 2017-06-13 DIAGNOSIS — I10 Essential (primary) hypertension: Secondary | ICD-10-CM

## 2017-06-13 DIAGNOSIS — R197 Diarrhea, unspecified: Secondary | ICD-10-CM

## 2017-06-13 DIAGNOSIS — C9210 Chronic myeloid leukemia, BCR/ABL-positive, not having achieved remission: Secondary | ICD-10-CM | POA: Diagnosis present

## 2017-06-13 LAB — CBC WITH DIFFERENTIAL (CANCER CENTER ONLY)
Basophils Absolute: 0.1 10*3/uL (ref 0.0–0.1)
Basophils Relative: 1 %
Eosinophils Absolute: 0.4 10*3/uL (ref 0.0–0.5)
Eosinophils Relative: 7 %
HCT: 40.2 % (ref 34.8–46.6)
Hemoglobin: 13.7 g/dL (ref 11.6–15.9)
Lymphocytes Relative: 27 %
Lymphs Abs: 1.5 10*3/uL (ref 0.9–3.3)
MCH: 30.9 pg (ref 25.1–34.0)
MCHC: 34.1 g/dL (ref 31.5–36.0)
MCV: 90.7 fL (ref 79.5–101.0)
Monocytes Absolute: 0.6 10*3/uL (ref 0.1–0.9)
Monocytes Relative: 11 %
Neutro Abs: 3 10*3/uL (ref 1.5–6.5)
Neutrophils Relative %: 54 %
Platelet Count: 218 10*3/uL (ref 145–400)
RBC: 4.43 MIL/uL (ref 3.70–5.45)
RDW: 13.3 % (ref 11.2–14.5)
WBC Count: 5.6 10*3/uL (ref 3.9–10.3)

## 2017-06-13 LAB — CMP (CANCER CENTER ONLY)
ALT: 23 U/L (ref 0–55)
AST: 19 U/L (ref 5–34)
Albumin: 4.1 g/dL (ref 3.5–5.0)
Alkaline Phosphatase: 38 U/L — ABNORMAL LOW (ref 40–150)
Anion gap: 6 (ref 3–11)
BUN: 20 mg/dL (ref 7–26)
CO2: 27 mmol/L (ref 22–29)
Calcium: 9.9 mg/dL (ref 8.4–10.4)
Chloride: 106 mmol/L (ref 98–109)
Creatinine: 1.07 mg/dL (ref 0.60–1.10)
GFR, Est AFR Am: 54 mL/min — ABNORMAL LOW (ref >60)
GFR, Estimated: 47 mL/min — ABNORMAL LOW (ref >60)
Glucose, Bld: 101 mg/dL (ref 70–140)
Potassium: 4.7 mmol/L (ref 3.5–5.1)
Sodium: 139 mmol/L (ref 136–145)
Total Bilirubin: 0.7 mg/dL (ref 0.2–1.2)
Total Protein: 7 g/dL (ref 6.4–8.3)

## 2017-06-13 NOTE — Telephone Encounter (Signed)
Printed avs and calender of upcoming appointment. Per 5/7 los 

## 2017-06-13 NOTE — Progress Notes (Signed)
[place] OFFICE PROGRESS NOTE   Diagnosis: CML  INTERVAL HISTORY:   Ms. Halvorsen returns as scheduled.  She continues nilotinib at a dose of 150 mg daily.  She feels well.  No new complaint.  Objective:  Vital signs in last 24 hours:  Blood pressure (!) 164/89, pulse 65, temperature 98.4 F (36.9 C), temperature source Oral, resp. rate 18, height 5\' 3"  (1.6 m), weight 125 lb 6.4 oz (56.9 kg), SpO2 100 %.    HEENT: Mild periorbital edema, no thrush or ulcers Resp: Lungs clear bilaterally Cardio: Regular rate and rhythm GI: No hepatosplenomegaly, nontender Vascular: No leg edema  Skin: No rash    Lab Results:  Lab Results  Component Value Date   WBC 5.6 06/13/2017   HGB 13.7 06/13/2017   HCT 40.2 06/13/2017   MCV 90.7 06/13/2017   PLT 218 06/13/2017   NEUTROABS 3.0 06/13/2017    CMP     Component Value Date/Time   NA 139 06/13/2017 1013   NA 141 12/13/2016 0948   K 4.7 06/13/2017 1013   K 3.8 12/13/2016 0948   CL 106 06/13/2017 1013   CL 107 05/14/2012 0958   CO2 27 06/13/2017 1013   CO2 26 12/13/2016 0948   GLUCOSE 101 06/13/2017 1013   GLUCOSE 102 12/13/2016 0948   GLUCOSE 76 05/14/2012 0958   BUN 20 06/13/2017 1013   BUN 15.9 12/13/2016 0948   CREATININE 1.07 06/13/2017 1013   CREATININE 1.0 12/13/2016 0948   CALCIUM 9.9 06/13/2017 1013   CALCIUM 9.6 12/13/2016 0948   PROT 7.0 06/13/2017 1013   PROT 7.1 12/13/2016 0948   ALBUMIN 4.1 06/13/2017 1013   ALBUMIN 4.0 12/13/2016 0948   AST 19 06/13/2017 1013   AST 17 12/13/2016 0948   ALT 23 06/13/2017 1013   ALT 18 12/13/2016 0948   ALKPHOS 38 (L) 06/13/2017 1013   ALKPHOS 38 (L) 12/13/2016 0948   BILITOT 0.7 06/13/2017 1013   BILITOT 0.79 12/13/2016 0948   GFRNONAA 47 (L) 06/13/2017 1013   GFRAA 54 (L) 06/13/2017 1013    Peripheral blood PCR on 03/14/2017-0.176% Medications: I have reviewed the patient's current medications.   Assessment/Plan: 1. Chronic myelogenous leukemia  -- initially treated with hydroxyurea. She began [place] on 08/27/2009. Gleevec was placed on hold on 10/06/2009 due to poor tolerance of the 400 mg daily dose. Gleevec was restarted when she had hematologic improvement on 10/13/2009 and the dose was changed to 300 mg on 10/26/2009. The Gleevec dose was reduced to 200 mg daily due to excessive "tearing". The peripheral blood BCR/ABL returned at 16% on 04/19/2010. Gleevec was discontinued secondary to toxicity and a persistent elevation of the peripheral blood translocation . She began Nilotinib on 06/09/2011. Nilotinib placed on hold beginning 08/04/2011 due to fatigue/dyspnea on exertion and slight QT prolongation. Nilotinib was resumed on 08/15/2011 at a dose of 300 mg daily. Peripheral blood PCR was negative on 12/27/2011 and 05/14/2012. Mild elevation of the peripheral blood PCR on 10/02/2012. Improved on repeat peripheral blood PCR 01/01/2013. Stable 11/25/2014. Improved 05/26/2015.Stable 08/25/2015.Improved 11/24/2015.  Patient self reduced dose of Nilotinib from 300 mg daily to 150 mg daily in December 2018 2. Diarrhea. Intermittent, mild. 3. Periorbital edema -improved since stopping Gleevec. 4. Increased "tearing"-improved with discontinuance of Gleevec.  5. History of a fine papular rash over the trunk and legs-likely secondary to the nilotinib.  6. Mild QT prolongation and multifocal atrial pacemaker on EKG 08/04/2011. QT normal on EKG 08/26/2014. QTc  normal 05/26/2015,08/25/2015, and 02/23/2016 7. History of mild hypokalemia -maintained on oral potassium replacement. Potassium in normal range on labs today. 8. Hypertension    Disposition: [name] appears stable.  The peripheral blood PCR was slightly higher when she is here in February.  We will follow-up on the PCR from today.  She will continue nilotinib at the current dose.  [name] will return for an office visit in 3 months.  Betsy Coder, MD  06/13/2017  11:31 AM

## 2017-06-19 ENCOUNTER — Telehealth: Payer: Self-pay | Admitting: *Deleted

## 2017-06-19 DIAGNOSIS — C9210 Chronic myeloid leukemia, BCR/ABL-positive, not having achieved remission: Secondary | ICD-10-CM

## 2017-06-19 NOTE — Telephone Encounter (Signed)
Called pt with PCR result: Per Dr. Benay Spice: BCR/ABL has risen slightly over the past few months. Resume Tasigna 300 mg dose.  Called pt with above instructions, she asks if she can take 200 mg daily instead. Will review with MD.

## 2017-06-20 MED ORDER — NILOTINIB HCL 200 MG PO CAPS: 200.0000 mg | ORAL_CAPSULE | Freq: Every day | ORAL | 0 refills | Status: DC

## 2017-06-20 NOTE — Telephone Encounter (Signed)
Is she taking 150 mg once or twice daily?  Okay to change to 200 mg daily if she will not agreed to go to twice daily dosing

## 2017-06-20 NOTE — Telephone Encounter (Signed)
Returned call to pt, she wants to try 200 mg daily. Pt thinks the twice daily dose of 150 mg caused severe fatigue. Pt reports she gets nilotinib from [facility]. Prescription E-scribed.

## 2017-06-23 LAB — BCR/ABL

## 2017-06-26 ENCOUNTER — Other Ambulatory Visit: Payer: Self-pay | Admitting: Family Medicine

## 2017-06-26 DIAGNOSIS — Z1231 Encounter for screening mammogram for malignant neoplasm of breast: Secondary | ICD-10-CM

## 2017-07-24 ENCOUNTER — Other Ambulatory Visit: Payer: Self-pay | Admitting: Oncology

## 2017-07-24 DIAGNOSIS — C9210 Chronic myeloid leukemia, BCR/ABL-positive, not having achieved remission: Secondary | ICD-10-CM

## 2017-08-16 ENCOUNTER — Ambulatory Visit
Admission: RE | Admit: 2017-08-16 | Discharge: 2017-08-16 | Disposition: A | Discharge status: Home / Self Care | Payer: Medicare Other | Source: Ambulatory Visit | Attending: Family Medicine | Admitting: Family Medicine

## 2017-08-16 DIAGNOSIS — Z1231 Encounter for screening mammogram for malignant neoplasm of breast: Secondary | ICD-10-CM

## 2017-08-25 ENCOUNTER — Other Ambulatory Visit: Payer: Self-pay | Admitting: Oncology

## 2017-08-25 DIAGNOSIS — C9210 Chronic myeloid leukemia, BCR/ABL-positive, not having achieved remission: Secondary | ICD-10-CM

## 2017-09-12 ENCOUNTER — Inpatient Hospital Stay: Payer: Medicare Other | Attending: Nurse Practitioner | Admitting: Nurse Practitioner

## 2017-09-12 ENCOUNTER — Encounter: Payer: Self-pay | Admitting: Nurse Practitioner

## 2017-09-12 ENCOUNTER — Inpatient Hospital Stay: Payer: Medicare Other

## 2017-09-12 ENCOUNTER — Telehealth: Payer: Self-pay | Admitting: Nurse Practitioner

## 2017-09-12 VITALS — BP 172/80 | HR 66 | Temp 98.7°F | Resp 17 | Ht 63.0 in | Wt 124.9 lb

## 2017-09-12 DIAGNOSIS — E876 Hypokalemia: Secondary | ICD-10-CM | POA: Insufficient documentation

## 2017-09-12 DIAGNOSIS — I10 Essential (primary) hypertension: Secondary | ICD-10-CM | POA: Insufficient documentation

## 2017-09-12 DIAGNOSIS — R197 Diarrhea, unspecified: Secondary | ICD-10-CM

## 2017-09-12 DIAGNOSIS — Z79899 Other long term (current) drug therapy: Secondary | ICD-10-CM

## 2017-09-12 DIAGNOSIS — C9210 Chronic myeloid leukemia, BCR/ABL-positive, not having achieved remission: Secondary | ICD-10-CM | POA: Diagnosis not present

## 2017-09-12 LAB — CMP (CANCER CENTER ONLY)
ALT: 22 U/L (ref 0–44)
AST: 17 U/L (ref 15–41)
Albumin: 3.8 g/dL (ref 3.5–5.0)
Alkaline Phosphatase: 41 U/L (ref 38–126)
Anion gap: 9 (ref 5–15)
BUN: 15 mg/dL (ref 8–23)
CO2: 26 mmol/L (ref 22–32)
Calcium: 9.1 mg/dL (ref 8.9–10.3)
Chloride: 107 mmol/L (ref 98–111)
Creatinine: 1.09 mg/dL — ABNORMAL HIGH (ref 0.44–1.00)
GFR, Est AFR Am: 53 mL/min — ABNORMAL LOW (ref >60)
GFR, Estimated: 46 mL/min — ABNORMAL LOW (ref >60)
Glucose, Bld: 103 mg/dL — ABNORMAL HIGH (ref 70–99)
Potassium: 4.2 mmol/L (ref 3.5–5.1)
Sodium: 142 mmol/L (ref 135–145)
Total Bilirubin: 0.9 mg/dL (ref 0.3–1.2)
Total Protein: 6.5 g/dL (ref 6.5–8.1)

## 2017-09-12 LAB — CBC WITH DIFFERENTIAL (CANCER CENTER ONLY)
Basophils Absolute: 0.1 10*3/uL (ref 0.0–0.1)
Basophils Relative: 1 %
Eosinophils Absolute: 0.4 10*3/uL (ref 0.0–0.5)
Eosinophils Relative: 6 %
HCT: 38.8 % (ref 34.8–46.6)
Hemoglobin: 13.4 g/dL (ref 11.6–15.9)
Lymphocytes Relative: 26 %
Lymphs Abs: 1.6 10*3/uL (ref 0.9–3.3)
MCH: 30.4 pg (ref 25.1–34.0)
MCHC: 34.6 g/dL (ref 31.5–36.0)
MCV: 87.9 fL (ref 79.5–101.0)
Monocytes Absolute: 0.5 10*3/uL (ref 0.1–0.9)
Monocytes Relative: 8 %
Neutro Abs: 3.7 10*3/uL (ref 1.5–6.5)
Neutrophils Relative %: 59 %
Platelet Count: 240 10*3/uL (ref 145–400)
RBC: 4.42 MIL/uL (ref 3.70–5.45)
RDW: 13.3 % (ref 11.2–14.5)
WBC Count: 6.2 10*3/uL (ref 3.9–10.3)

## 2017-09-12 NOTE — Telephone Encounter (Signed)
Gave patient avs and calendar of upcoming appts.  °

## 2017-09-12 NOTE — Progress Notes (Signed)
  [place] OFFICE PROGRESS NOTE   Diagnosis: CML  INTERVAL HISTORY:   [name] returns as scheduled.  She continues Nilotinib.  The Nilotinib dose was increased to 200 mg following her last visit.  She estimates she has been taking the 200 mg dose for about 2 months.  She denies nausea/vomiting.  No mouth sores.  No diarrhea.  No rash.  No leg swelling.  She reports a good appetite.  Objective:  Vital signs in last 24 hours:  Blood pressure (!) 172/80, pulse 66, temperature 98.7 F (37.1 C), temperature source Oral, resp. rate 17, height 5\' 3"  (1.6 m), weight 124 lb 14.4 oz (56.7 kg), SpO2 100 %.    HEENT: No thrush or ulcers. Lymphatics: No palpable cervical or supraclavicular lymph nodes. Resp: Lungs clear bilaterally. Cardio: Regular rate and rhythm. GI: Abdomen soft and nontender.  No splenomegaly. Vascular: No leg edema.  Skin: No rash.   Lab Results:  Lab Results  Component Value Date   WBC 6.2 09/12/2017   HGB 13.4 09/12/2017   HCT 38.8 09/12/2017   MCV 87.9 09/12/2017   PLT 240 09/12/2017   NEUTROABS 3.7 09/12/2017    Imaging:  No results found.  Medications: I have reviewed the patient's current medications.  Assessment/Plan: 1. Chronic myelogenous leukemia -- initially treated with hydroxyurea. She began [place] on 08/27/2009. Gleevec was placed on hold on 10/06/2009 due to poor tolerance of the 400 mg daily dose. Gleevec was restarted when she had hematologic improvement on 10/13/2009 and the dose was changed to 300 mg on 10/26/2009. The Gleevec dose was reduced to 200 mg daily due to excessive "tearing". The peripheral blood BCR/ABL returned at 16% on 04/19/2010. Gleevec was discontinued secondary to toxicity and a persistent elevation of the peripheral blood translocation . She began Nilotinib on 06/09/2011. Nilotinib placed on hold beginning 08/04/2011 due to fatigue/dyspnea on exertion and slight QT prolongation. Nilotinib was resumed on  08/15/2011 at a dose of 300 mg daily. Peripheral blood PCR was negative on 12/27/2011 and 05/14/2012. Mild elevation of the peripheral blood PCR on 10/02/2012. Improved on repeat peripheral blood PCR 01/01/2013. Stable 11/25/2014. Improved 05/26/2015.Stable 08/25/2015.Improved 11/24/2015.  Patient self reduced dose of Nilotinibfrom 300 mg daily to 150 mg daily in December 2018  Nilotinib 200 mg daily June 2019 2. Diarrhea. Intermittent, mild. 3. Periorbital edema -improved since stopping Gleevec. 4. Increased "tearing"-improved with discontinuance of Gleevec.  5. History of a fine papular rash over the trunk and legs-likely secondary to the nilotinib.  6. Mild QT prolongation and multifocal atrial pacemaker on EKG 08/04/2011. QT normal on EKG 08/26/2014. QTc normal 05/26/2015,08/25/2015, and 02/23/2016 7. History of mild hypokalemia -maintained on oral potassium replacement. Potassium in normal range on labs today. 8. Hypertension      Disposition: [name] appears stable.  The peripheral blood PCR was slightly higher in May.  For now she will continue Nilotinib 200 mg daily and we will follow-up on the PCR from today.  She will return for lab and a follow-up visit in 3 months.  She will contact the office in the interim with any problems.  We discussed the elevated blood pressure.  She reports normal blood pressure readings at home.  Plan reviewed with Dr. Benay Spice.    [name] ANP/GNP-BC   09/12/2017  10:26 AM

## 2017-09-21 ENCOUNTER — Other Ambulatory Visit: Payer: Self-pay | Admitting: Emergency Medicine

## 2017-09-21 DIAGNOSIS — C9210 Chronic myeloid leukemia, BCR/ABL-positive, not having achieved remission: Secondary | ICD-10-CM

## 2017-09-21 MED ORDER — NILOTINIB HCL 150 MG PO CAPS: 300.0000 mg | ORAL_CAPSULE | Freq: Every day | ORAL | 2 refills | Status: DC

## 2017-09-25 ENCOUNTER — Other Ambulatory Visit: Payer: Self-pay | Admitting: Oncology

## 2017-09-25 ENCOUNTER — Other Ambulatory Visit: Payer: Self-pay

## 2017-09-25 DIAGNOSIS — C9210 Chronic myeloid leukemia, BCR/ABL-positive, not having achieved remission: Secondary | ICD-10-CM

## 2017-09-25 MED ORDER — NILOTINIB HCL 150 MG PO CAPS: 300.0000 mg | ORAL_CAPSULE | Freq: Every day | ORAL | 2 refills | Status: DC

## 2017-09-27 ENCOUNTER — Telehealth: Payer: Self-pay | Admitting: *Deleted

## 2017-09-27 ENCOUNTER — Telehealth: Payer: Self-pay

## 2017-09-27 DIAGNOSIS — C9210 Chronic myeloid leukemia, BCR/ABL-positive, not having achieved remission: Secondary | ICD-10-CM

## 2017-09-27 LAB — BCR/ABL

## 2017-09-27 MED ORDER — NILOTINIB HCL 150 MG PO CAPS: 300.0000 mg | ORAL_CAPSULE | Freq: Every day | ORAL | 2 refills | Status: DC

## 2017-09-27 NOTE — Telephone Encounter (Signed)
TC from [facility] Janace Hoard) regarding pt's prescription for Tasigna. Angie states that [facility] is not currently the pharmacy filling pt's Tasigna. She was unsure of which pharmacy is currently filling this for patient. Telephone call to patient. Spoke with patient, she states that [facility] is the pharmcy she uses for the Tasigna. Reordered the Tasgina for her via escribe. Also notified Sr. Sherrill's nurse about this.

## 2017-09-27 NOTE — Telephone Encounter (Signed)
Spoke with pharmacist at [facility] to give a verbal ok to fill the Tasigna since it was e-scribed. OK per Dr. Benay Spice.

## 2017-12-19 ENCOUNTER — Inpatient Hospital Stay: Payer: Medicare Other | Admitting: Oncology

## 2017-12-19 ENCOUNTER — Inpatient Hospital Stay: Payer: Medicare Other | Attending: Nurse Practitioner

## 2017-12-19 ENCOUNTER — Telehealth: Payer: Self-pay

## 2017-12-19 VITALS — BP 180/76 | HR 60 | Temp 98.1°F | Resp 18 | Ht 63.0 in | Wt 126.0 lb

## 2017-12-19 DIAGNOSIS — I10 Essential (primary) hypertension: Secondary | ICD-10-CM | POA: Insufficient documentation

## 2017-12-19 DIAGNOSIS — C9210 Chronic myeloid leukemia, BCR/ABL-positive, not having achieved remission: Secondary | ICD-10-CM | POA: Insufficient documentation

## 2017-12-19 DIAGNOSIS — Z23 Encounter for immunization: Secondary | ICD-10-CM | POA: Diagnosis not present

## 2017-12-19 DIAGNOSIS — Z950 Presence of cardiac pacemaker: Secondary | ICD-10-CM | POA: Insufficient documentation

## 2017-12-19 DIAGNOSIS — Z79899 Other long term (current) drug therapy: Secondary | ICD-10-CM | POA: Diagnosis not present

## 2017-12-19 LAB — CBC WITH DIFFERENTIAL (CANCER CENTER ONLY)
Abs Immature Granulocytes: 0.04 10*3/uL (ref 0.00–0.07)
Basophils Absolute: 0.1 10*3/uL (ref 0.0–0.1)
Basophils Relative: 1 %
Eosinophils Absolute: 0.4 10*3/uL (ref 0.0–0.5)
Eosinophils Relative: 6 %
HCT: 40.2 % (ref 36.0–46.0)
Hemoglobin: 13.9 g/dL (ref 12.0–15.0)
Immature Granulocytes: 1 %
Lymphocytes Relative: 23 %
Lymphs Abs: 1.6 10*3/uL (ref 0.7–4.0)
MCH: 31 pg (ref 26.0–34.0)
MCHC: 34.6 g/dL (ref 30.0–36.0)
MCV: 89.5 fL (ref 80.0–100.0)
Monocytes Absolute: 0.7 10*3/uL (ref 0.1–1.0)
Monocytes Relative: 10 %
Neutro Abs: 4 10*3/uL (ref 1.7–7.7)
Neutrophils Relative %: 59 %
Platelet Count: 243 10*3/uL (ref 150–400)
RBC: 4.49 MIL/uL (ref 3.87–5.11)
RDW: 12.6 % (ref 11.5–15.5)
WBC Count: 6.7 10*3/uL (ref 4.0–10.5)
nRBC: 0 % (ref 0.0–0.2)

## 2017-12-19 LAB — CMP (CANCER CENTER ONLY)
ALT: 22 U/L (ref 0–44)
AST: 19 U/L (ref 15–41)
Albumin: 4.1 g/dL (ref 3.5–5.0)
Alkaline Phosphatase: 40 U/L (ref 38–126)
Anion gap: 8 (ref 5–15)
BUN: 21 mg/dL (ref 8–23)
CO2: 24 mmol/L (ref 22–32)
Calcium: 9.5 mg/dL (ref 8.9–10.3)
Chloride: 108 mmol/L (ref 98–111)
Creatinine: 1.05 mg/dL — ABNORMAL HIGH (ref 0.44–1.00)
GFR, Est AFR Am: 55 mL/min — ABNORMAL LOW (ref >60)
GFR, Estimated: 48 mL/min — ABNORMAL LOW (ref >60)
Glucose, Bld: 98 mg/dL (ref 70–99)
Potassium: 4.1 mmol/L (ref 3.5–5.1)
Sodium: 140 mmol/L (ref 135–145)
Total Bilirubin: 0.7 mg/dL (ref 0.3–1.2)
Total Protein: 7 g/dL (ref 6.5–8.1)

## 2017-12-19 MED ORDER — INFLUENZA VAC SPLIT HIGH-DOSE 0.5 ML IM SUSY
0.5000 mL | PREFILLED_SYRINGE | Freq: Once | INTRAMUSCULAR | Status: AC
  Administered 2017-12-19: 0.5 mL via INTRAMUSCULAR
  Filled 2017-12-19: qty 0.5

## 2017-12-19 NOTE — Patient Instructions (Signed)
Please bring in copy of Advanced Directive/Living Will at next visit to scan into chart

## 2017-12-19 NOTE — Progress Notes (Signed)
[place] OFFICE PROGRESS NOTE   Diagnosis: CML  INTERVAL HISTORY:   [name] returns as scheduled.  She began nilotinib at the 300 mg daily dose on approximately 09/27/2017.  She feels tired at times.  No other complaint.  No diarrhea or rash.  Objective:  Vital signs in last 24 hours:  Blood pressure (!) 180/76, pulse 60, temperature 98.1 F (36.7 C), temperature source Oral, resp. rate 18, height 5\' 3"  (1.6 m), weight 126 lb (57.2 kg), SpO2 99 %.    HEENT: Mild periorbital edema, no thrush or ulcers Resp: Lungs clear bilaterally Cardio: Regular rate and rhythm GI: No hepatosplenomegaly Vascular: No leg edema  Skin: No rash   Lab Results:  Lab Results  Component Value Date   WBC 6.7 12/19/2017   HGB 13.9 12/19/2017   HCT 40.2 12/19/2017   MCV 89.5 12/19/2017   PLT 243 12/19/2017   NEUTROABS 4.0 12/19/2017    CMP  Lab Results  Component Value Date   NA 140 12/19/2017   K 4.1 12/19/2017   CL 108 12/19/2017   CO2 24 12/19/2017   GLUCOSE 98 12/19/2017   BUN 21 12/19/2017   CREATININE 1.05 (H) 12/19/2017   CALCIUM 9.5 12/19/2017   PROT 7.0 12/19/2017   ALBUMIN 4.1 12/19/2017   AST 19 12/19/2017   ALT 22 12/19/2017   ALKPHOS 40 12/19/2017   BILITOT 0.7 12/19/2017   GFRNONAA 48 (L) 12/19/2017   GFRAA 55 (L) 12/19/2017     Medications: I have reviewed the patient's current medications.   Assessment/Plan: 1. Chronic myelogenous leukemia -- initially treated with hydroxyurea. She began [place] on 08/27/2009. Gleevec was placed on hold on 10/06/2009 due to poor tolerance of the 400 mg daily dose. Gleevec was restarted when she had hematologic improvement on 10/13/2009 and the dose was changed to 300 mg on 10/26/2009. The Gleevec dose was reduced to 200 mg daily due to excessive "tearing". The peripheral blood BCR/ABL returned at 16% on 04/19/2010. Gleevec was discontinued secondary to toxicity and a persistent elevation of the peripheral blood  translocation . She began Nilotinib on 06/09/2011. Nilotinib placed on hold beginning 08/04/2011 due to fatigue/dyspnea on exertion and slight QT prolongation. Nilotinib was resumed on 08/15/2011 at a dose of 300 mg daily. Peripheral blood PCR was negative on 12/27/2011 and 05/14/2012. Mild elevation of the peripheral blood PCR on 10/02/2012. Improved on repeat peripheral blood PCR 01/01/2013. Stable 11/25/2014. Improved 05/26/2015.Stable 08/25/2015.Improved 11/24/2015.  Patient self reduced dose of Nilotinibfrom 300 mg daily to 150 mg dailyin December 2018  Nilotinib 200 mg daily June 2019  Peripheral blood PCR higher on 09/12/2017  Nilotinib increased to 300 mg daily beginning 09/27/2017 2. Diarrhea. Intermittent, mild. 3. Periorbital edema -improved since stopping Gleevec. 4. Increased "tearing"-improved with discontinuance of Gleevec.  5. History of a fine papular rash over the trunk and legs-likely secondary to the nilotinib.  6. Mild QT prolongation and multifocal atrial pacemaker on EKG 08/04/2011. QT normal on EKG 08/26/2014. QTc normal 05/26/2015,08/25/2015,  02/23/2016 and 06/13/2017 7. History of mild hypokalemia -maintained on oral potassium replacement. Potassium in normal range on labs today. 8. Hypertension  Disposition: Ms. Suder appears stable.  She is tolerating the nilotinib well at the current dose.  We will follow-up on the peripheral blood PCR from today.  She is in hematologic remission from CML.  She will return for an office lab visit in 3 months.  She received an influenza vaccine today.  We will administer a  23 valent pneumococcal vaccine when she returns in 3 months.  We will check an EKG when she returns in 3 months.  Betsy Coder, MD  12/19/2017  12:47 PM

## 2017-12-19 NOTE — Telephone Encounter (Signed)
Printed avs and calender of upcoming appointment. Per 11/12

## 2017-12-25 ENCOUNTER — Other Ambulatory Visit: Payer: Self-pay | Admitting: Oncology

## 2017-12-25 DIAGNOSIS — C9210 Chronic myeloid leukemia, BCR/ABL-positive, not having achieved remission: Secondary | ICD-10-CM

## 2018-01-02 ENCOUNTER — Telehealth: Payer: Self-pay | Admitting: *Deleted

## 2018-01-02 NOTE — Telephone Encounter (Signed)
Notified patient that the PCR is stable. Continue same regimen and follow up as scheduled.

## 2018-01-16 LAB — BCR/ABL

## 2018-01-17 ENCOUNTER — Telehealth: Payer: Self-pay | Admitting: Emergency Medicine

## 2018-01-17 NOTE — Telephone Encounter (Addendum)
Pt verbalized understanding   ----- Message from Owens Shark, NP sent at 01/17/2018  8:42 AM EST ----- Please let her know PCR is stable, follow-up as scheduled

## 2018-03-07 ENCOUNTER — Other Ambulatory Visit: Payer: Self-pay | Admitting: Oncology

## 2018-03-07 DIAGNOSIS — C9210 Chronic myeloid leukemia, BCR/ABL-positive, not having achieved remission: Secondary | ICD-10-CM

## 2018-03-20 ENCOUNTER — Inpatient Hospital Stay: Payer: Medicare Other

## 2018-03-20 ENCOUNTER — Other Ambulatory Visit: Payer: Medicare Other

## 2018-03-20 ENCOUNTER — Other Ambulatory Visit: Payer: Self-pay | Admitting: *Deleted

## 2018-03-20 ENCOUNTER — Telehealth: Payer: Self-pay | Admitting: Nurse Practitioner

## 2018-03-20 ENCOUNTER — Encounter: Payer: Self-pay | Admitting: Nurse Practitioner

## 2018-03-20 ENCOUNTER — Telehealth: Payer: Self-pay | Admitting: *Deleted

## 2018-03-20 ENCOUNTER — Inpatient Hospital Stay: Payer: Medicare Other | Attending: Nurse Practitioner | Admitting: Nurse Practitioner

## 2018-03-20 VITALS — BP 150/82 | HR 67 | Temp 98.2°F | Resp 19 | Ht 63.0 in | Wt 125.3 lb

## 2018-03-20 DIAGNOSIS — Z79899 Other long term (current) drug therapy: Secondary | ICD-10-CM | POA: Insufficient documentation

## 2018-03-20 DIAGNOSIS — I10 Essential (primary) hypertension: Secondary | ICD-10-CM

## 2018-03-20 DIAGNOSIS — C9210 Chronic myeloid leukemia, BCR/ABL-positive, not having achieved remission: Secondary | ICD-10-CM | POA: Diagnosis not present

## 2018-03-20 DIAGNOSIS — R197 Diarrhea, unspecified: Secondary | ICD-10-CM | POA: Insufficient documentation

## 2018-03-20 DIAGNOSIS — Z23 Encounter for immunization: Secondary | ICD-10-CM | POA: Diagnosis not present

## 2018-03-20 DIAGNOSIS — R0981 Nasal congestion: Secondary | ICD-10-CM | POA: Diagnosis not present

## 2018-03-20 DIAGNOSIS — R9081 Abnormal echoencephalogram: Secondary | ICD-10-CM | POA: Diagnosis not present

## 2018-03-20 DIAGNOSIS — R51 Headache: Secondary | ICD-10-CM | POA: Diagnosis not present

## 2018-03-20 LAB — CBC WITH DIFFERENTIAL (CANCER CENTER ONLY)
Abs Immature Granulocytes: 0.04 10*3/uL (ref 0.00–0.07)
Basophils Absolute: 0.1 10*3/uL (ref 0.0–0.1)
Basophils Relative: 1 %
Eosinophils Absolute: 0.2 10*3/uL (ref 0.0–0.5)
Eosinophils Relative: 4 %
HCT: 39.6 % (ref 36.0–46.0)
Hemoglobin: 13.5 g/dL (ref 12.0–15.0)
Immature Granulocytes: 1 %
Lymphocytes Relative: 16 %
Lymphs Abs: 1 10*3/uL (ref 0.7–4.0)
MCH: 30.6 pg (ref 26.0–34.0)
MCHC: 34.1 g/dL (ref 30.0–36.0)
MCV: 89.8 fL (ref 80.0–100.0)
Monocytes Absolute: 0.5 10*3/uL (ref 0.1–1.0)
Monocytes Relative: 7 %
Neutro Abs: 4.6 10*3/uL (ref 1.7–7.7)
Neutrophils Relative %: 71 %
Platelet Count: 213 10*3/uL (ref 150–400)
RBC: 4.41 MIL/uL (ref 3.87–5.11)
RDW: 12.8 % (ref 11.5–15.5)
WBC Count: 6.4 10*3/uL (ref 4.0–10.5)
nRBC: 0 % (ref 0.0–0.2)

## 2018-03-20 LAB — CMP (CANCER CENTER ONLY)
ALT: 22 U/L (ref 0–44)
AST: 19 U/L (ref 15–41)
Albumin: 4.1 g/dL (ref 3.5–5.0)
Alkaline Phosphatase: 38 U/L (ref 38–126)
Anion gap: 9 (ref 5–15)
BUN: 18 mg/dL (ref 8–23)
CO2: 26 mmol/L (ref 22–32)
Calcium: 9.6 mg/dL (ref 8.9–10.3)
Chloride: 106 mmol/L (ref 98–111)
Creatinine: 1.26 mg/dL — ABNORMAL HIGH (ref 0.44–1.00)
GFR, Est AFR Am: 46 mL/min — ABNORMAL LOW (ref >60)
GFR, Estimated: 39 mL/min — ABNORMAL LOW (ref >60)
Glucose, Bld: 120 mg/dL — ABNORMAL HIGH (ref 70–99)
Potassium: 4.1 mmol/L (ref 3.5–5.1)
Sodium: 141 mmol/L (ref 135–145)
Total Bilirubin: 0.9 mg/dL (ref 0.3–1.2)
Total Protein: 7 g/dL (ref 6.5–8.1)

## 2018-03-20 MED ORDER — PNEUMOCOCCAL VAC POLYVALENT 25 MCG/0.5ML IJ INJ
0.5000 mL | INJECTION | Freq: Once | INTRAMUSCULAR | Status: AC
  Administered 2018-03-20: 0.5 mL via INTRAMUSCULAR
  Filled 2018-03-20: qty 0.5

## 2018-03-20 NOTE — Telephone Encounter (Signed)
A fax received from [facility] stating several unsuccessful  attempts to contact patient for a refill on Tasigna. Notified patient to make her aware. Patient states she currently has a two week supply of medication left, and she calls for a refill when she only has a week supply left.  Made [facility] representative aware of this.

## 2018-03-20 NOTE — Telephone Encounter (Signed)
Scheduled appt per 02/11 los.  Printed calendar and avs,

## 2018-03-20 NOTE — Progress Notes (Signed)
  [place] OFFICE PROGRESS NOTE   Diagnosis: CML  INTERVAL HISTORY:   [name] returns as scheduled.  She overall is feeling well.  At present she feels anxious due to driving in the rain on her way to today's appointment.  She has periodic headaches and nasal congestion.  No nausea or vomiting.  No mouth sores.  No rash.  No diarrhea.  Objective:  Vital signs in last 24 hours:  Blood pressure (!) 150/82, pulse 67, temperature 98.2 F (36.8 C), temperature source Oral, resp. rate 19, height 5\' 3"  (1.6 m), weight 125 lb 4.8 oz (56.8 kg), SpO2 99 %.    HEENT: No thrush or ulcers. Resp: Lungs clear bilaterally. Cardio: Regular rate and rhythm. GI: Abdomen soft and nontender.  No hepatosplenomegaly. Vascular: No leg edema.  Skin: No rash.   Lab Results:  Lab Results  Component Value Date   WBC 6.4 03/20/2018   HGB 13.5 03/20/2018   HCT 39.6 03/20/2018   MCV 89.8 03/20/2018   PLT 213 03/20/2018   NEUTROABS 4.6 03/20/2018    Imaging:  No results found.  Medications: I have reviewed the patient's current medications.  Assessment/Plan: 1. Chronic myelogenous leukemia -- initially treated with hydroxyurea. She began [place] on 08/27/2009. Gleevec was placed on hold on 10/06/2009 due to poor tolerance of the 400 mg daily dose. Gleevec was restarted when she had hematologic improvement on 10/13/2009 and the dose was changed to 300 mg on 10/26/2009. The Gleevec dose was reduced to 200 mg daily due to excessive "tearing". The peripheral blood BCR/ABL returned at 16% on 04/19/2010. Gleevec was discontinued secondary to toxicity and a persistent elevation of the peripheral blood translocation . She began Nilotinib on 06/09/2011. Nilotinib placed on hold beginning 08/04/2011 due to fatigue/dyspnea on exertion and slight QT prolongation. Nilotinib was resumed on 08/15/2011 at a dose of 300 mg daily. Peripheral blood PCR was negative on 12/27/2011 and 05/14/2012. Mild  elevation of the peripheral blood PCR on 10/02/2012. Improved on repeat peripheral blood PCR 01/01/2013. Stable 11/25/2014. Improved 05/26/2015.Stable 08/25/2015.Improved 11/24/2015.  Patient self reduced dose of Nilotinibfrom 300 mg daily to 150 mg dailyin December 2018  Nilotinib 200 mg dailyJune2019  Peripheral blood PCR higher on 09/12/2017  Nilotinib increased to 300 mg daily beginning 09/27/2017  Peripheral blood PCR improved 12/19/2017 2. Diarrhea. Intermittent, mild. 3. Periorbital edema -improved since stopping Gleevec. 4. Increased "tearing"-improved with discontinuance of Gleevec.  5. History of a fine papular rash over the trunk and legs-likely secondary to the nilotinib.  6. Mild QT prolongation and multifocal atrial pacemaker on EKG 08/04/2011. QT normal on EKG 08/26/2014. QTc normal 05/26/2015,08/25/2015,  02/23/2016 and 06/13/2017 7. History of mild hypokalemia -maintained on oral potassium replacement. Potassium in normal range on labs today. 8. Hypertension  Disposition: [name] appears stable.  The most recent peripheral blood PCR was improved.  We will follow-up on the value from today.  She will continue Nilotinib at the current dose.  We reviewed the EKG from today.  The QTc interval is within acceptable range.  She will receive the 23 valent pneumococcal vaccine today.  She will return for lab, EKG and follow-up in 3 months.  Plan reviewed with Dr. Benay Spice.    [name] ANP/GNP-BC   03/20/2018  11:09 AM

## 2018-03-28 LAB — BCR/ABL

## 2018-04-09 ENCOUNTER — Other Ambulatory Visit: Payer: Self-pay | Admitting: Oncology

## 2018-04-09 DIAGNOSIS — C9210 Chronic myeloid leukemia, BCR/ABL-positive, not having achieved remission: Secondary | ICD-10-CM

## 2018-04-12 ENCOUNTER — Other Ambulatory Visit: Payer: Self-pay | Admitting: Family Medicine

## 2018-04-12 DIAGNOSIS — M810 Age-related osteoporosis without current pathological fracture: Secondary | ICD-10-CM

## 2018-04-25 ENCOUNTER — Other Ambulatory Visit: Payer: Self-pay | Admitting: Family Medicine

## 2018-04-25 DIAGNOSIS — Z1231 Encounter for screening mammogram for malignant neoplasm of breast: Secondary | ICD-10-CM

## 2018-05-14 ENCOUNTER — Other Ambulatory Visit: Payer: Self-pay | Admitting: Oncology

## 2018-05-14 DIAGNOSIS — C9210 Chronic myeloid leukemia, BCR/ABL-positive, not having achieved remission: Secondary | ICD-10-CM

## 2018-06-04 ENCOUNTER — Other Ambulatory Visit: Payer: Self-pay | Admitting: Oncology

## 2018-06-04 DIAGNOSIS — C9210 Chronic myeloid leukemia, BCR/ABL-positive, not having achieved remission: Secondary | ICD-10-CM

## 2018-06-18 ENCOUNTER — Inpatient Hospital Stay: Payer: Medicare Other

## 2018-06-18 ENCOUNTER — Inpatient Hospital Stay: Payer: Medicare Other | Admitting: Oncology

## 2018-07-02 ENCOUNTER — Other Ambulatory Visit: Payer: Self-pay | Admitting: Oncology

## 2018-07-02 DIAGNOSIS — C9210 Chronic myeloid leukemia, BCR/ABL-positive, not having achieved remission: Secondary | ICD-10-CM

## 2018-07-24 ENCOUNTER — Telehealth: Payer: Self-pay | Admitting: Oncology

## 2018-07-24 ENCOUNTER — Inpatient Hospital Stay (HOSPITAL_BASED_OUTPATIENT_CLINIC_OR_DEPARTMENT_OTHER): Payer: Medicare Other | Admitting: Oncology

## 2018-07-24 ENCOUNTER — Inpatient Hospital Stay: Payer: Medicare Other

## 2018-07-24 ENCOUNTER — Other Ambulatory Visit: Payer: Self-pay

## 2018-07-24 ENCOUNTER — Inpatient Hospital Stay: Payer: Medicare Other | Attending: Oncology

## 2018-07-24 VITALS — BP 179/75 | HR 74 | Temp 98.0°F | Resp 17 | Ht 63.0 in | Wt 125.1 lb

## 2018-07-24 DIAGNOSIS — R600 Localized edema: Secondary | ICD-10-CM | POA: Diagnosis not present

## 2018-07-24 DIAGNOSIS — J3489 Other specified disorders of nose and nasal sinuses: Secondary | ICD-10-CM

## 2018-07-24 DIAGNOSIS — C9210 Chronic myeloid leukemia, BCR/ABL-positive, not having achieved remission: Secondary | ICD-10-CM | POA: Diagnosis not present

## 2018-07-24 DIAGNOSIS — R197 Diarrhea, unspecified: Secondary | ICD-10-CM | POA: Diagnosis not present

## 2018-07-24 DIAGNOSIS — Z79899 Other long term (current) drug therapy: Secondary | ICD-10-CM | POA: Insufficient documentation

## 2018-07-24 DIAGNOSIS — I10 Essential (primary) hypertension: Secondary | ICD-10-CM | POA: Insufficient documentation

## 2018-07-24 LAB — CMP (CANCER CENTER ONLY)
ALT: 20 U/L (ref 0–44)
AST: 17 U/L (ref 15–41)
Albumin: 4 g/dL (ref 3.5–5.0)
Alkaline Phosphatase: 39 U/L (ref 38–126)
Anion gap: 11 (ref 5–15)
BUN: 18 mg/dL (ref 8–23)
CO2: 23 mmol/L (ref 22–32)
Calcium: 9.4 mg/dL (ref 8.9–10.3)
Chloride: 107 mmol/L (ref 98–111)
Creatinine: 1.19 mg/dL — ABNORMAL HIGH (ref 0.44–1.00)
GFR, Est AFR Am: 49 mL/min — ABNORMAL LOW (ref >60)
GFR, Estimated: 42 mL/min — ABNORMAL LOW (ref >60)
Glucose, Bld: 130 mg/dL — ABNORMAL HIGH (ref 70–99)
Potassium: 3.7 mmol/L (ref 3.5–5.1)
Sodium: 141 mmol/L (ref 135–145)
Total Bilirubin: 0.7 mg/dL (ref 0.3–1.2)
Total Protein: 7.3 g/dL (ref 6.5–8.1)

## 2018-07-24 LAB — CBC WITH DIFFERENTIAL (CANCER CENTER ONLY)
Abs Immature Granulocytes: 0.07 10*3/uL (ref 0.00–0.07)
Basophils Absolute: 0.1 10*3/uL (ref 0.0–0.1)
Basophils Relative: 1 %
Eosinophils Absolute: 0.2 10*3/uL (ref 0.0–0.5)
Eosinophils Relative: 3 %
HCT: 40.3 % (ref 36.0–46.0)
Hemoglobin: 13.9 g/dL (ref 12.0–15.0)
Immature Granulocytes: 1 %
Lymphocytes Relative: 17 %
Lymphs Abs: 1.2 10*3/uL (ref 0.7–4.0)
MCH: 31 pg (ref 26.0–34.0)
MCHC: 34.5 g/dL (ref 30.0–36.0)
MCV: 90 fL (ref 80.0–100.0)
Monocytes Absolute: 0.5 10*3/uL (ref 0.1–1.0)
Monocytes Relative: 7 %
Neutro Abs: 5.1 10*3/uL (ref 1.7–7.7)
Neutrophils Relative %: 71 %
Platelet Count: 289 10*3/uL (ref 150–400)
RBC: 4.48 MIL/uL (ref 3.87–5.11)
RDW: 12.8 % (ref 11.5–15.5)
WBC Count: 7.1 10*3/uL (ref 4.0–10.5)
nRBC: 0 % (ref 0.0–0.2)

## 2018-07-24 NOTE — Progress Notes (Signed)
[place] OFFICE PROGRESS NOTE   Diagnosis: CML  INTERVAL HISTORY:   [name] returns as scheduled.  She continues nilotinib.  No rash or diarrhea.  Good appetite and energy level.  Stable periorbital edema.  She has increased tearing and rhinorrhea.  She relates this to both allergies and the nilotinib.  Objective:  Vital signs in last 24 hours:  Blood pressure (!) 179/75, pulse 74, temperature 98 F (36.7 C), temperature source Oral, resp. rate 17, height 5\' 3"  (1.6 m), weight 125 lb 1.6 oz (56.7 kg), SpO2 99 %. Limited physical examination secondary to distancing with COVID pandemic  HEENT: Mild periorbital edema, increased tearing  Lab Results:  Lab Results  Component Value Date   WBC 7.1 07/24/2018   HGB 13.9 07/24/2018   HCT 40.3 07/24/2018   MCV 90.0 07/24/2018   PLT 289 07/24/2018   NEUTROABS 5.1 07/24/2018    CMP  Lab Results  Component Value Date   NA 141 07/24/2018   K 3.7 07/24/2018   CL 107 07/24/2018   CO2 23 07/24/2018   GLUCOSE 130 (H) 07/24/2018   BUN 18 07/24/2018   CREATININE 1.19 (H) 07/24/2018   CALCIUM 9.4 07/24/2018   PROT 7.3 07/24/2018   ALBUMIN 4.0 07/24/2018   AST 17 07/24/2018   ALT 20 07/24/2018   ALKPHOS 39 07/24/2018   BILITOT 0.7 07/24/2018   GFRNONAA 42 (L) 07/24/2018   GFRAA 49 (L) 07/24/2018   EKG- QTC 457  Medications: I have reviewed the patient's current medications.   Assessment/Plan: 1. Chronic myelogenous leukemia -- initially treated with hydroxyurea. She began [place] on 08/27/2009. Gleevec was placed on hold on 10/06/2009 due to poor tolerance of the 400 mg daily dose. Gleevec was restarted when she had hematologic improvement on 10/13/2009 and the dose was changed to 300 mg on 10/26/2009. The Gleevec dose was reduced to 200 mg daily due to excessive "tearing". The peripheral blood BCR/ABL returned at 16% on 04/19/2010. Gleevec was discontinued secondary to toxicity and a persistent elevation of  the peripheral blood translocation . She began Nilotinib on 06/09/2011. Nilotinib placed on hold beginning 08/04/2011 due to fatigue/dyspnea on exertion and slight QT prolongation. Nilotinib was resumed on 08/15/2011 at a dose of 300 mg daily. Peripheral blood PCR was negative on 12/27/2011 and 05/14/2012. Mild elevation of the peripheral blood PCR on 10/02/2012. Improved on repeat peripheral blood PCR 01/01/2013. Stable 11/25/2014. Improved 05/26/2015.Stable 08/25/2015.Improved 11/24/2015.  Patient self reduced dose of Nilotinibfrom 300 mg daily to 150 mg dailyin December 2018  Nilotinib 200 mg dailyJune2019  Peripheral blood PCR higher on 09/12/2017  Nilotinib increased to 300 mg daily beginning 09/27/2017  Peripheral blood PCR improved 12/19/2017, stable on 03/20/2018 2. Diarrhea. Intermittent, mild. 3. Periorbital edema -improved since stopping Gleevec. 4. Increased "tearing"-improved with discontinuance of Gleevec.  5. History of a fine papular rash over the trunk and legs-likely secondary to the nilotinib.  6. Mild QT prolongation and multifocal atrial pacemaker on EKG 08/04/2011. QT normal on EKG 08/26/2014. QTc normal 05/26/2015,08/25/2015,  02/23/2016 and 06/13/2017 7. History of mild hypokalemia -maintained on oral potassium replacement. Potassium in normal range on labs today. 8. Hypertension    Disposition: [name] appears unchanged.  She is in hematologic remission from CML.  We will follow-up on the peripheral blood PCR from today.  She will continue nilotinib at the current dose.  She has stable periorbital edema and increased tearing, likely related to nilotinib.  She will return for office lab  visit in 4 months.  She will contact us in the interim for new symptoms.  15 minutes were spent with the patient today.  The majority of the time was used for counseling and coordination of care.  Jill Coder, MD  07/24/2018  10:26 AM

## 2018-07-24 NOTE — Telephone Encounter (Signed)
Scheduled per los. Gave avs and calendar  

## 2018-07-31 LAB — BCR/ABL

## 2018-08-06 ENCOUNTER — Other Ambulatory Visit: Payer: Self-pay | Admitting: Oncology

## 2018-08-06 DIAGNOSIS — C9210 Chronic myeloid leukemia, BCR/ABL-positive, not having achieved remission: Secondary | ICD-10-CM

## 2018-08-21 ENCOUNTER — Other Ambulatory Visit: Payer: Self-pay

## 2018-08-21 ENCOUNTER — Ambulatory Visit
Admission: RE | Admit: 2018-08-21 | Discharge: 2018-08-21 | Disposition: A | Discharge status: Home / Self Care | Payer: Medicare Other | Source: Ambulatory Visit | Attending: Family Medicine | Admitting: Family Medicine

## 2018-08-21 DIAGNOSIS — Z1231 Encounter for screening mammogram for malignant neoplasm of breast: Secondary | ICD-10-CM

## 2018-08-21 DIAGNOSIS — M810 Age-related osteoporosis without current pathological fracture: Secondary | ICD-10-CM

## 2018-08-27 ENCOUNTER — Other Ambulatory Visit: Payer: Self-pay | Admitting: Nurse Practitioner

## 2018-08-27 DIAGNOSIS — C9210 Chronic myeloid leukemia, BCR/ABL-positive, not having achieved remission: Secondary | ICD-10-CM

## 2018-11-05 ENCOUNTER — Telehealth: Payer: Self-pay

## 2018-11-05 NOTE — Telephone Encounter (Signed)
Oral Oncology Patient Advocate Encounter  I received a notification from [employer] that the Tasigna copay was $325.16.   I called the patient and introduced myself, I informed her that I was able to help with copay assistance if she wanted to me to go over options with her. She is interested in assistance.  There are no grants available at the moment, I am able to help her apply for [employer] patient assistance in attempt to make her out of pocket cost $0. I explained how the patient assistance program worked and she agreed to apply.   Based on the income information she gave me I recommended that she apply for LIS as well and we did that while on the phone. When she receives the letter from [facility] she will call me and let me know the outcome.  In the meantime we will work on patient assistance. I faxed the completed application to [facility] today, 11/05/18.  This encounter will be updated until final determination.  The patient verbalized understanding and great appreciation.  [place] Patient [place] Phone [phone] Fax [phone] 11/05/2018   12:07 PM

## 2018-11-08 NOTE — Telephone Encounter (Signed)
Oral Oncology Patient Advocate Encounter  I followed up with [employer], the Tasigna application has been through benefits verification and is now being sent into processing for the [employer] patient assistance program.  This encounter will be updated until final determination.  [place] Patient [place] Phone [phone] Fax [phone] 11/08/2018   3:11 PM

## 2018-11-12 NOTE — Telephone Encounter (Signed)
Oral Oncology Patient Advocate Encounter  I called [employer] and followed up on the [place] application.  The application is still in processing and I was told I should expect a decision this evening or tomorrow.  I called the patient and gave her this update, she verbalized understanding and great appreciation.  [place] Patient [place] Phone [phone] Fax [phone] 11/12/2018   11:00 AM

## 2018-11-12 NOTE — Telephone Encounter (Signed)
Oral Oncology Patient Advocate Encounter  Received notification from [employer] Patient Assistance program that patient has been successfully enrolled into their program to receive Tasigna from the manufacturer at $0 out of pocket until 02/07/19.    I called and spoke with patient.  She knows we will have to re-apply.   Patient knows to call the office with questions or concerns.   Oral Oncology Clinic will continue to follow.  [place] Patient [place] Phone [phone] Fax [phone] 11/12/2018    1:27 PM

## 2018-11-20 ENCOUNTER — Inpatient Hospital Stay: Payer: Medicare Other | Attending: Nurse Practitioner | Admitting: Nurse Practitioner

## 2018-11-20 ENCOUNTER — Encounter: Payer: Self-pay | Admitting: Nurse Practitioner

## 2018-11-20 ENCOUNTER — Inpatient Hospital Stay: Payer: Medicare Other

## 2018-11-20 ENCOUNTER — Telehealth: Payer: Self-pay | Admitting: Nurse Practitioner

## 2018-11-20 ENCOUNTER — Other Ambulatory Visit: Payer: Self-pay

## 2018-11-20 ENCOUNTER — Other Ambulatory Visit: Payer: Self-pay | Admitting: *Deleted

## 2018-11-20 VITALS — BP 173/65 | HR 72 | Temp 98.2°F | Resp 17 | Ht 63.0 in | Wt 124.4 lb

## 2018-11-20 DIAGNOSIS — R197 Diarrhea, unspecified: Secondary | ICD-10-CM | POA: Diagnosis not present

## 2018-11-20 DIAGNOSIS — Z23 Encounter for immunization: Secondary | ICD-10-CM | POA: Diagnosis not present

## 2018-11-20 DIAGNOSIS — R609 Edema, unspecified: Secondary | ICD-10-CM | POA: Diagnosis not present

## 2018-11-20 DIAGNOSIS — C9210 Chronic myeloid leukemia, BCR/ABL-positive, not having achieved remission: Secondary | ICD-10-CM

## 2018-11-20 LAB — CBC WITH DIFFERENTIAL (CANCER CENTER ONLY)
Abs Immature Granulocytes: 0.04 10*3/uL (ref 0.00–0.07)
Basophils Absolute: 0.1 10*3/uL (ref 0.0–0.1)
Basophils Relative: 1 %
Eosinophils Absolute: 0.3 10*3/uL (ref 0.0–0.5)
Eosinophils Relative: 4 %
HCT: 39.9 % (ref 36.0–46.0)
Hemoglobin: 13.8 g/dL (ref 12.0–15.0)
Immature Granulocytes: 1 %
Lymphocytes Relative: 21 %
Lymphs Abs: 1.3 10*3/uL (ref 0.7–4.0)
MCH: 30.5 pg (ref 26.0–34.0)
MCHC: 34.6 g/dL (ref 30.0–36.0)
MCV: 88.3 fL (ref 80.0–100.0)
Monocytes Absolute: 0.5 10*3/uL (ref 0.1–1.0)
Monocytes Relative: 8 %
Neutro Abs: 4.2 10*3/uL (ref 1.7–7.7)
Neutrophils Relative %: 65 %
Platelet Count: 239 10*3/uL (ref 150–400)
RBC: 4.52 MIL/uL (ref 3.87–5.11)
RDW: 12.6 % (ref 11.5–15.5)
WBC Count: 6.4 10*3/uL (ref 4.0–10.5)
nRBC: 0 % (ref 0.0–0.2)

## 2018-11-20 LAB — CMP (CANCER CENTER ONLY)
ALT: 15 U/L (ref 0–44)
AST: 15 U/L (ref 15–41)
Albumin: 4 g/dL (ref 3.5–5.0)
Alkaline Phosphatase: 38 U/L (ref 38–126)
Anion gap: 12 (ref 5–15)
BUN: 17 mg/dL (ref 8–23)
CO2: 26 mmol/L (ref 22–32)
Calcium: 9.5 mg/dL (ref 8.9–10.3)
Chloride: 105 mmol/L (ref 98–111)
Creatinine: 1.19 mg/dL — ABNORMAL HIGH (ref 0.44–1.00)
GFR, Est AFR Am: 49 mL/min — ABNORMAL LOW (ref >60)
GFR, Estimated: 42 mL/min — ABNORMAL LOW (ref >60)
Glucose, Bld: 111 mg/dL — ABNORMAL HIGH (ref 70–99)
Potassium: 3.9 mmol/L (ref 3.5–5.1)
Sodium: 143 mmol/L (ref 135–145)
Total Bilirubin: 1 mg/dL (ref 0.3–1.2)
Total Protein: 7 g/dL (ref 6.5–8.1)

## 2018-11-20 MED ORDER — INFLUENZA VAC A&B SA ADJ QUAD 0.5 ML IM PRSY
0.5000 mL | PREFILLED_SYRINGE | Freq: Once | INTRAMUSCULAR | Status: DC
  Administered 2018-11-20: 0.5 mL via INTRAMUSCULAR

## 2018-11-20 MED ORDER — INFLUENZA VAC A&B SA ADJ QUAD 0.5 ML IM PRSY: 0.5000 mL | PREFILLED_SYRINGE | Freq: Once | INTRAMUSCULAR | Status: DC

## 2018-11-20 MED ORDER — INFLUENZA VAC A&B SA ADJ QUAD 0.5 ML IM PRSY
PREFILLED_SYRINGE | INTRAMUSCULAR | Status: AC
  Filled 2018-11-20: qty 0.5

## 2018-11-20 NOTE — Progress Notes (Signed)
Hi dose flu vaccine given & immunization questions answered appropriately.  Information given.

## 2018-11-20 NOTE — Telephone Encounter (Signed)
Scheduled appt per 10/13 los - gave patient AVS and calender per los.

## 2018-11-20 NOTE — Progress Notes (Signed)
°  [place] OFFICE PROGRESS NOTE   Diagnosis: CML  INTERVAL HISTORY:   [name] returns as scheduled.  She continues Nilotinib.  She feels well.  No nausea or vomiting.  No mouth sores.  No diarrhea.  No skin rash.  She denies shortness of breath.  Objective:  Vital signs in last 24 hours:  Blood pressure (!) 173/65, pulse 72, temperature 98.2 F (36.8 C), resp. rate 17, height 5\' 3"  (1.6 m), weight 124 lb 6.4 oz (56.4 kg), SpO2 100 %.    HEENT: No thrush or ulcers.  Mild periorbital edema. Resp: Lungs clear bilaterally. Cardio: Regular rate and rhythm. GI: Abdomen soft and nontender.  No hepatosplenomegaly. Vascular: No leg edema. Skin: No rash.   Lab Results:  Lab Results  Component Value Date   WBC 6.4 11/20/2018   HGB 13.8 11/20/2018   HCT 39.9 11/20/2018   MCV 88.3 11/20/2018   PLT 239 11/20/2018   NEUTROABS 4.2 11/20/2018    Imaging:  No results found.  Medications: I have reviewed the patient's current medications.  Assessment/Plan: 1. Chronic myelogenous leukemia -- initially treated with hydroxyurea. She began [place] on 08/27/2009. Gleevec was placed on hold on 10/06/2009 due to poor tolerance of the 400 mg daily dose. Gleevec was restarted when she had hematologic improvement on 10/13/2009 and the dose was changed to 300 mg on 10/26/2009. The Gleevec dose was reduced to 200 mg daily due to excessive "tearing". The peripheral blood BCR/ABL returned at 16% on 04/19/2010. Gleevec was discontinued secondary to toxicity and a persistent elevation of the peripheral blood translocation . She began Nilotinib on 06/09/2011. Nilotinib placed on hold beginning 08/04/2011 due to fatigue/dyspnea on exertion and slight QT prolongation. Nilotinib was resumed on 08/15/2011 at a dose of 300 mg daily. Peripheral blood PCR was negative on 12/27/2011 and 05/14/2012. Mild elevation of the peripheral blood PCR on 10/02/2012. Improved on repeat peripheral blood PCR  01/01/2013. Stable 11/25/2014. Improved 05/26/2015.Stable 08/25/2015.Improved 11/24/2015.  Patient self reduced dose of Nilotinibfrom 300 mg daily to 150 mg dailyin December 2018  Nilotinib 200 mg dailyJune2019  Peripheral blood PCR higher on 09/12/2017  Nilotinib increased to 300 mg daily beginning 09/27/2017  Peripheral blood PCR improved 12/19/2017, stable on 03/20/2018, stable 07/24/2018 2. Diarrhea. Intermittent, mild. 3. Periorbital edema -improved since stopping Gleevec. 4. Increased "tearing"-improved with discontinuance of Gleevec.  5. History of a fine papular rash over the trunk and legs-likely secondary to the nilotinib.  6. Mild QT prolongation and multifocal atrial pacemaker on EKG 08/04/2011. QT normal on EKG 08/26/2014. QTc normal 05/26/2015,08/25/2015, 01/16/2018and 06/13/2017 7. History of mild hypokalemia -maintained on oral potassium replacement. Potassium in normal range on labs today. 8. Hypertension  Disposition: [name] appears stable.  She remains in hematologic remission from CML.  She will continue Nilotinib.  We will follow-up on the peripheral blood PCR today.  She will receive the influenza vaccine today.  She will return for lab and follow-up in 4 months.    [name] ANP/GNP-BC   11/20/2018  9:56 AM

## 2018-11-20 NOTE — Progress Notes (Deleted)
OK to treat per [name] NP with Beryle Flock but  No Al[place] today.  Order repeated & verified.

## 2018-11-27 LAB — BCR/ABL

## 2018-12-05 ENCOUNTER — Telehealth: Payer: Self-pay

## 2018-12-05 NOTE — Telephone Encounter (Signed)
Oral Oncology Patient Advocate Encounter  [place] patient assistance will expire on 02/07/19.  I filled out the renewal application and spoke to the patient. She requested that I email the signature portion to her daughter. I sent that email on 10/27 and received the signed application back on [id number].  This application is ready to be faxed to [employer] when the renewal period starts on Nov. 16th.  Oral Oncology Clinic will continue to follow.  [place] Patient [place] Phone [phone] Fax [phone] 12/05/2018   1:45 PM

## 2019-01-02 NOTE — Telephone Encounter (Signed)
Oral Oncology Pharmacist Encounter   I submitted an application for [employer] patient assistance application online in an effort to obtain Tasigna at $0 out of pocket cost.   Confirmation [id number]   [employer] PANO phone number for follow-up: [phone]   I spoke with patient to inform her that application has been submitted.   This encounter will continue to be updated until final determination.   Johny Drilling, PharmD, BCPS, BCOP  01/02/2019  2:30 PM  Oral Oncology Clinic [phone]

## 2019-02-11 ENCOUNTER — Telehealth: Payer: Self-pay | Admitting: *Deleted

## 2019-02-11 DIAGNOSIS — C9210 Chronic myeloid leukemia, BCR/ABL-positive, not having achieved remission: Secondary | ICD-10-CM

## 2019-02-11 MED ORDER — NILOTINIB HCL 150 MG PO CAPS: ORAL_CAPSULE | ORAL | 2 refills | Status: DC

## 2019-02-11 NOTE — Telephone Encounter (Signed)
Left VM requesting return call re: Tasinga. Called back and she reports that her patient assistance has expired--they are processing her paperwork for renewal. Needs script sent to [facility] to finish processing. Informed her it will be sent now.

## 2019-03-05 NOTE — Telephone Encounter (Signed)
Patient is approved for assistance for Tasigna through [employer] PAF 03/05/19-02/07/20.  [employer] PAF phone [phone]  [place] Patient [place] Phone [phone] Fax [phone] 03/05/2019 3:39 PM

## 2019-03-26 ENCOUNTER — Other Ambulatory Visit: Payer: Self-pay | Admitting: *Deleted

## 2019-03-26 ENCOUNTER — Inpatient Hospital Stay: Payer: Medicare Other | Attending: Oncology | Admitting: Oncology

## 2019-03-26 ENCOUNTER — Telehealth: Payer: Self-pay | Admitting: Oncology

## 2019-03-26 ENCOUNTER — Other Ambulatory Visit: Payer: Self-pay

## 2019-03-26 ENCOUNTER — Inpatient Hospital Stay: Payer: Medicare Other

## 2019-03-26 VITALS — BP 140/60 | HR 77 | Temp 97.8°F | Resp 16 | Ht 63.0 in | Wt 120.7 lb

## 2019-03-26 DIAGNOSIS — C9210 Chronic myeloid leukemia, BCR/ABL-positive, not having achieved remission: Secondary | ICD-10-CM

## 2019-03-26 DIAGNOSIS — I10 Essential (primary) hypertension: Secondary | ICD-10-CM | POA: Insufficient documentation

## 2019-03-26 DIAGNOSIS — R197 Diarrhea, unspecified: Secondary | ICD-10-CM | POA: Insufficient documentation

## 2019-03-26 LAB — CBC WITH DIFFERENTIAL (CANCER CENTER ONLY)
Abs Immature Granulocytes: 0.03 10*3/uL (ref 0.00–0.07)
Basophils Absolute: 0.1 10*3/uL (ref 0.0–0.1)
Basophils Relative: 1 %
Eosinophils Absolute: 0.2 10*3/uL (ref 0.0–0.5)
Eosinophils Relative: 2 %
HCT: 39.6 % (ref 36.0–46.0)
Hemoglobin: 13.5 g/dL (ref 12.0–15.0)
Immature Granulocytes: 0 %
Lymphocytes Relative: 14 %
Lymphs Abs: 1.3 10*3/uL (ref 0.7–4.0)
MCH: 29.9 pg (ref 26.0–34.0)
MCHC: 34.1 g/dL (ref 30.0–36.0)
MCV: 87.8 fL (ref 80.0–100.0)
Monocytes Absolute: 0.8 10*3/uL (ref 0.1–1.0)
Monocytes Relative: 8 %
Neutro Abs: 6.8 10*3/uL (ref 1.7–7.7)
Neutrophils Relative %: 75 %
Platelet Count: 272 10*3/uL (ref 150–400)
RBC: 4.51 MIL/uL (ref 3.87–5.11)
RDW: 13.5 % (ref 11.5–15.5)
WBC Count: 9.1 10*3/uL (ref 4.0–10.5)
nRBC: 0 % (ref 0.0–0.2)

## 2019-03-26 LAB — CMP (CANCER CENTER ONLY)
ALT: 17 U/L (ref 0–44)
AST: 11 U/L — ABNORMAL LOW (ref 15–41)
Albumin: 3.9 g/dL (ref 3.5–5.0)
Alkaline Phosphatase: 48 U/L (ref 38–126)
Anion gap: 10 (ref 5–15)
BUN: 19 mg/dL (ref 8–23)
CO2: 25 mmol/L (ref 22–32)
Calcium: 9.6 mg/dL (ref 8.9–10.3)
Chloride: 105 mmol/L (ref 98–111)
Creatinine: 1.18 mg/dL — ABNORMAL HIGH (ref 0.44–1.00)
GFR, Est AFR Am: 49 mL/min — ABNORMAL LOW (ref >60)
GFR, Estimated: 42 mL/min — ABNORMAL LOW (ref >60)
Glucose, Bld: 127 mg/dL — ABNORMAL HIGH (ref 70–99)
Potassium: 3.4 mmol/L — ABNORMAL LOW (ref 3.5–5.1)
Sodium: 140 mmol/L (ref 135–145)
Total Bilirubin: 1 mg/dL (ref 0.3–1.2)
Total Protein: 7.5 g/dL (ref 6.5–8.1)

## 2019-03-26 MED ORDER — POTASSIUM CHLORIDE ER 10 MEQ PO CPCR: 10.0000 meq | ORAL_CAPSULE | Freq: Every day | ORAL | 3 refills | Status: DC

## 2019-03-26 NOTE — Telephone Encounter (Signed)
Scheduled appt per 2/16 los - gave patient AVS and calender per los 

## 2019-03-26 NOTE — Progress Notes (Signed)
[name]   Diagnosis: CML  INTERVAL HISTORY:   [name] returns as scheduled.  She continues nilotinib.  She is not taking potassium.  She feels well.  No rash or diarrhea.  She has noted a bruise near the left eye.  No other bleeding.  She reports intentional weight loss.  Objective:  Vital signs in last 24 hours:  Blood pressure 140/60, pulse 77, temperature 97.8 F (36.6 C), temperature source Temporal, resp. rate 16, height 5\' 3"  (1.6 m), weight 120 lb 11.2 oz (54.7 kg), SpO2 99 %.    HEENT: No thrush or ulcers Cardio: Regular rate and rhythm, 2/6 systolic murmur GI: No hepatosplenomegaly Vascular: No leg edema  Skin: Small ecchymosis inferior to the left orbit    Lab Results:  Lab Results  Component Value Date   WBC 9.1 03/26/2019   HGB 13.5 03/26/2019   HCT 39.6 03/26/2019   MCV 87.8 03/26/2019   PLT 272 03/26/2019   NEUTROABS 6.8 03/26/2019    CMP  Lab Results  Component Value Date   NA 143 11/20/2018   K 3.9 11/20/2018   CL 105 11/20/2018   CO2 26 11/20/2018   GLUCOSE 111 (H) 11/20/2018   BUN 17 11/20/2018   CREATININE 1.19 (H) 11/20/2018   CALCIUM 9.5 11/20/2018   PROT 7.0 11/20/2018   ALBUMIN 4.0 11/20/2018   AST 15 11/20/2018   ALT 15 11/20/2018   ALKPHOS 38 11/20/2018   BILITOT 1.0 11/20/2018   GFRNONAA 42 (L) 11/20/2018   GFRAA 49 (L) 11/20/2018    No results found for: CEA1  Lab Results  Component Value Date   INR 1.0 12/24/2007    Imaging:  No results found.  Medications: I have reviewed the patient's current medications.   Assessment/Plan: 1. Chronic myelogenous leukemia -- initially treated with hydroxyurea. She began [place] on 08/27/2009. Gleevec was placed on hold on 10/06/2009 due to poor tolerance of the 400 mg daily dose. Gleevec was restarted when she had hematologic improvement on 10/13/2009 and the dose was changed to 300 mg on 10/26/2009. The Gleevec dose was reduced to 200  mg daily due to excessive "tearing". The peripheral blood BCR/ABL returned at 16% on 04/19/2010. Gleevec was discontinued secondary to toxicity and a persistent elevation of the peripheral blood translocation . She began Nilotinib on 06/09/2011. Nilotinib placed on hold beginning 08/04/2011 due to fatigue/dyspnea on exertion and slight QT prolongation. Nilotinib was resumed on 08/15/2011 at a dose of 300 mg daily. Peripheral blood PCR was negative on 12/27/2011 and 05/14/2012. Mild elevation of the peripheral blood PCR on 10/02/2012. Improved on repeat peripheral blood PCR 01/01/2013. Stable 11/25/2014. Improved 05/26/2015.Stable 08/25/2015.Improved 11/24/2015.  Patient self reduced dose of Nilotinibfrom 300 mg daily to 150 mg dailyin December 2018  Nilotinib 200 mg dailyJune2019  Peripheral blood PCR higher on 09/12/2017  Nilotinib increased to 300 mg daily beginning 09/27/2017  Peripheral blood PCR improved 12/19/2017, stable on 03/20/2018, stable 07/24/2018, stable 11/20/2018 2. Diarrhea. Intermittent, mild. 3. Periorbital edema -improved since stopping Gleevec. 4. Increased "tearing"-improved with discontinuance of Gleevec.  5. History of a fine papular rash over the trunk and legs-likely secondary to the nilotinib.  6. Mild QT prolongation and multifocal atrial pacemaker on EKG 08/04/2011. QT normal on EKG 08/26/2014. QTc normal 05/26/2015,08/25/2015, 01/16/2018and 06/13/2017 7. History of mild hypokalemia -maintained on oral potassium replacement. Potassium in normal range on labs today. 8. Hypertension    Disposition: [name] appears stable.  She remains  in hematologic remission from CML.  We will follow-up on the peripheral blood PCR today.  She will continue nilotinib.  The potassium is mildly low.  She reports that she does not like the potassium chloride pills.  We prescribed Micro-K.  She will return for office and lab visit in 4 months.  We encouraged her to obtain the  COVID-19 vaccine.   Jill Coder, MD  03/26/2019  10:50 AM

## 2019-04-04 LAB — BCR/ABL

## 2019-05-13 ENCOUNTER — Telehealth: Payer: Self-pay | Admitting: *Deleted

## 2019-05-13 DIAGNOSIS — C9210 Chronic myeloid leukemia, BCR/ABL-positive, not having achieved remission: Secondary | ICD-10-CM

## 2019-05-13 MED ORDER — NILOTINIB HCL 150 MG PO CAPS: ORAL_CAPSULE | ORAL | 2 refills | Status: DC

## 2019-05-13 NOTE — Telephone Encounter (Signed)
Called to report she used her last refill on the Tasigna and needs new script.

## 2019-07-18 ENCOUNTER — Other Ambulatory Visit: Payer: Self-pay | Admitting: Family Medicine

## 2019-07-18 DIAGNOSIS — Z1231 Encounter for screening mammogram for malignant neoplasm of breast: Secondary | ICD-10-CM

## 2019-07-23 ENCOUNTER — Encounter: Payer: Self-pay | Admitting: Nurse Practitioner

## 2019-07-23 ENCOUNTER — Other Ambulatory Visit: Payer: Self-pay

## 2019-07-23 ENCOUNTER — Inpatient Hospital Stay: Payer: Medicare Other

## 2019-07-23 ENCOUNTER — Inpatient Hospital Stay: Payer: Medicare Other | Attending: Nurse Practitioner | Admitting: Nurse Practitioner

## 2019-07-23 ENCOUNTER — Telehealth: Payer: Self-pay | Admitting: Nurse Practitioner

## 2019-07-23 VITALS — BP 147/73 | HR 66 | Temp 98.1°F | Resp 16 | Wt 123.3 lb

## 2019-07-23 DIAGNOSIS — C9210 Chronic myeloid leukemia, BCR/ABL-positive, not having achieved remission: Secondary | ICD-10-CM

## 2019-07-23 DIAGNOSIS — I10 Essential (primary) hypertension: Secondary | ICD-10-CM | POA: Insufficient documentation

## 2019-07-23 LAB — CBC WITH DIFFERENTIAL (CANCER CENTER ONLY)
Abs Immature Granulocytes: 0.04 10*3/uL (ref 0.00–0.07)
Basophils Absolute: 0.1 10*3/uL (ref 0.0–0.1)
Basophils Relative: 1 %
Eosinophils Absolute: 0.4 10*3/uL (ref 0.0–0.5)
Eosinophils Relative: 5 %
HCT: 40.2 % (ref 36.0–46.0)
Hemoglobin: 13.7 g/dL (ref 12.0–15.0)
Immature Granulocytes: 1 %
Lymphocytes Relative: 23 %
Lymphs Abs: 1.5 10*3/uL (ref 0.7–4.0)
MCH: 30.2 pg (ref 26.0–34.0)
MCHC: 34.1 g/dL (ref 30.0–36.0)
MCV: 88.7 fL (ref 80.0–100.0)
Monocytes Absolute: 0.5 10*3/uL (ref 0.1–1.0)
Monocytes Relative: 7 %
Neutro Abs: 4.2 10*3/uL (ref 1.7–7.7)
Neutrophils Relative %: 63 %
Platelet Count: 249 10*3/uL (ref 150–400)
RBC: 4.53 MIL/uL (ref 3.87–5.11)
RDW: 12.9 % (ref 11.5–15.5)
WBC Count: 6.7 10*3/uL (ref 4.0–10.5)
nRBC: 0 % (ref 0.0–0.2)

## 2019-07-23 LAB — MAGNESIUM: Magnesium: 1.8 mg/dL (ref 1.7–2.4)

## 2019-07-23 LAB — CMP (CANCER CENTER ONLY)
ALT: 16 U/L (ref 0–44)
AST: 15 U/L (ref 15–41)
Albumin: 4 g/dL (ref 3.5–5.0)
Alkaline Phosphatase: 36 U/L — ABNORMAL LOW (ref 38–126)
Anion gap: 13 (ref 5–15)
BUN: 19 mg/dL (ref 8–23)
CO2: 21 mmol/L — ABNORMAL LOW (ref 22–32)
Calcium: 9.3 mg/dL (ref 8.9–10.3)
Chloride: 108 mmol/L (ref 98–111)
Creatinine: 1.2 mg/dL — ABNORMAL HIGH (ref 0.44–1.00)
GFR, Est AFR Am: 48 mL/min — ABNORMAL LOW (ref >60)
GFR, Estimated: 41 mL/min — ABNORMAL LOW (ref >60)
Glucose, Bld: 126 mg/dL — ABNORMAL HIGH (ref 70–99)
Potassium: 4 mmol/L (ref 3.5–5.1)
Sodium: 142 mmol/L (ref 135–145)
Total Bilirubin: 1.1 mg/dL (ref 0.3–1.2)
Total Protein: 7.2 g/dL (ref 6.5–8.1)

## 2019-07-23 NOTE — Telephone Encounter (Signed)
Scheduled per los. Gave avs and calendar  

## 2019-07-23 NOTE — Progress Notes (Signed)
  [place] OFFICE PROGRESS NOTE   Diagnosis: CML  INTERVAL HISTORY:   Ms. Dimiceli returns as scheduled.  She continues Nilotinib.  She feels well.  She denies nausea.  She has occasional diarrhea.  No rash.  No shortness of breath or chest pain.  Periodically she has pain in her legs at nighttime.  She has a good appetite.  Energy level varies.  Objective:  Vital signs in last 24 hours:  Blood pressure (!) 147/73, pulse 66, temperature 98.1 F (36.7 C), temperature source Oral, resp. rate 16, weight 123 lb 4.8 oz (55.9 kg), SpO2 97 %.    HEENT: Mild periorbital edema.  No thrush or ulcers. Resp: Lungs clear bilaterally. Cardio: Regular rate and rhythm. GI: Abdomen soft and nontender.  No hepatosplenomegaly. Vascular: No leg edema.  Skin: No rash.   Lab Results:  Lab Results  Component Value Date   WBC 6.7 07/23/2019   HGB 13.7 07/23/2019   HCT 40.2 07/23/2019   MCV 88.7 07/23/2019   PLT 249 07/23/2019   NEUTROABS 4.2 07/23/2019    Imaging:  No results found.  Medications: I have reviewed the patient's current medications.  Assessment/Plan: 1. Chronic myelogenous leukemia -- initially treated with hydroxyurea. She began [place] on 08/27/2009. Gleevec was placed on hold on 10/06/2009 due to poor tolerance of the 400 mg daily dose. Gleevec was restarted when she had hematologic improvement on 10/13/2009 and the dose was changed to 300 mg on 10/26/2009. The Gleevec dose was reduced to 200 mg daily due to excessive "tearing". The peripheral blood BCR/ABL returned at 16% on 04/19/2010. Gleevec was discontinued secondary to toxicity and a persistent elevation of the peripheral blood translocation . She began Nilotinib on 06/09/2011. Nilotinib placed on hold beginning 08/04/2011 due to fatigue/dyspnea on exertion and slight QT prolongation. Nilotinib was resumed on 08/15/2011 at a dose of 300 mg daily. Peripheral blood PCR was negative on 12/27/2011 and  05/14/2012. Mild elevation of the peripheral blood PCR on 10/02/2012. Improved on repeat peripheral blood PCR 01/01/2013. Stable 11/25/2014. Improved 05/26/2015.Stable 08/25/2015.Improved 11/24/2015.  Patient self reduced dose of Nilotinibfrom 300 mg daily to 150 mg dailyin December 2018  Nilotinib 200 mg dailyJune2019  Peripheral blood PCR higher on 09/12/2017  Nilotinib increased to 300 mg daily beginning 09/27/2017  Peripheral blood PCR improved 12/19/2017, stable on 03/20/2018, stable 07/24/2018, stable 11/20/2018 2. Diarrhea. Intermittent, mild. 3. Periorbital edema -improved since stopping Gleevec. 4. Increased "tearing"-improved with discontinuance of Gleevec.  5. History of a fine papular rash over the trunk and legs-likely secondary to the nilotinib.  6. Mild QT prolongation and multifocal atrial pacemaker on EKG 08/04/2011. QT normal on EKG 08/26/2014. QTc normal 05/26/2015,08/25/2015, 01/16/2018and 06/13/2017 7. History of mild hypokalemia -maintained on oral potassium replacement. Potassium in normal range on labs today. 8. Hypertension   Disposition: Ms. Askin remains in hematologic remission from [facility].  We will follow-up on the peripheral blood PCR from today.  She will continue Nilotinib.  She will return for lab, EKG and follow-up in 4 months.  She will contact the office in the interim with any problems.    [name] ANP/GNP-BC   07/23/2019  10:52 AM

## 2019-07-30 ENCOUNTER — Telehealth: Payer: Self-pay | Admitting: *Deleted

## 2019-07-30 LAB — BCR/ABL

## 2019-07-30 NOTE — Telephone Encounter (Signed)
Notified that her BCR/ABL result is better. Continue nilotinib and f/u as scheduled.

## 2019-08-05 ENCOUNTER — Other Ambulatory Visit: Payer: Self-pay | Admitting: *Deleted

## 2019-08-05 DIAGNOSIS — C9210 Chronic myeloid leukemia, BCR/ABL-positive, not having achieved remission: Secondary | ICD-10-CM

## 2019-08-05 MED ORDER — NILOTINIB HCL 150 MG PO CAPS: ORAL_CAPSULE | ORAL | 2 refills | Status: DC

## 2019-08-05 NOTE — Progress Notes (Signed)
Patient left message for refill on her nilotinib.

## 2019-08-15 ENCOUNTER — Telehealth: Payer: Self-pay | Admitting: *Deleted

## 2019-08-15 NOTE — Telephone Encounter (Signed)
Patient reports she has not heard from pharmacy regarding her Tasigna. She has missed #2 doses. Called [facility] to f/u. They have been having "system issues" for couple days. IT will be researched and will return call to office.

## 2019-09-03 ENCOUNTER — Other Ambulatory Visit: Payer: Self-pay

## 2019-09-03 ENCOUNTER — Ambulatory Visit
Admission: RE | Admit: 2019-09-03 | Discharge: 2019-09-03 | Disposition: A | Discharge status: Home / Self Care | Payer: Medicare Other | Source: Ambulatory Visit | Attending: Family Medicine | Admitting: Family Medicine

## 2019-09-03 DIAGNOSIS — Z1231 Encounter for screening mammogram for malignant neoplasm of breast: Secondary | ICD-10-CM

## 2019-11-06 ENCOUNTER — Other Ambulatory Visit: Payer: Self-pay | Admitting: *Deleted

## 2019-11-06 DIAGNOSIS — C9210 Chronic myeloid leukemia, BCR/ABL-positive, not having achieved remission: Secondary | ICD-10-CM

## 2019-11-06 MED ORDER — NILOTINIB HCL 150 MG PO CAPS: ORAL_CAPSULE | ORAL | 2 refills | Status: DC

## 2019-11-06 NOTE — Telephone Encounter (Signed)
Received faxed refill request for Tasigna

## 2019-11-19 ENCOUNTER — Other Ambulatory Visit: Payer: Self-pay

## 2019-11-19 ENCOUNTER — Inpatient Hospital Stay: Payer: Medicare Other

## 2019-11-19 ENCOUNTER — Inpatient Hospital Stay: Payer: Medicare Other | Attending: Oncology | Admitting: Oncology

## 2019-11-19 VITALS — BP 161/69 | HR 70 | Temp 98.2°F | Resp 16 | Ht 63.0 in | Wt 124.9 lb

## 2019-11-19 DIAGNOSIS — C9210 Chronic myeloid leukemia, BCR/ABL-positive, not having achieved remission: Secondary | ICD-10-CM

## 2019-11-19 DIAGNOSIS — Z23 Encounter for immunization: Secondary | ICD-10-CM | POA: Insufficient documentation

## 2019-11-19 LAB — CBC WITH DIFFERENTIAL (CANCER CENTER ONLY)
Abs Immature Granulocytes: 0.07 10*3/uL (ref 0.00–0.07)
Basophils Absolute: 0.1 10*3/uL (ref 0.0–0.1)
Basophils Relative: 1 %
Eosinophils Absolute: 0.3 10*3/uL (ref 0.0–0.5)
Eosinophils Relative: 4 %
HCT: 40 % (ref 36.0–46.0)
Hemoglobin: 13.7 g/dL (ref 12.0–15.0)
Immature Granulocytes: 1 %
Lymphocytes Relative: 15 %
Lymphs Abs: 1.1 10*3/uL (ref 0.7–4.0)
MCH: 30.2 pg (ref 26.0–34.0)
MCHC: 34.3 g/dL (ref 30.0–36.0)
MCV: 88.1 fL (ref 80.0–100.0)
Monocytes Absolute: 0.6 10*3/uL (ref 0.1–1.0)
Monocytes Relative: 8 %
Neutro Abs: 5.3 10*3/uL (ref 1.7–7.7)
Neutrophils Relative %: 71 %
Platelet Count: 233 10*3/uL (ref 150–400)
RBC: 4.54 MIL/uL (ref 3.87–5.11)
RDW: 12.8 % (ref 11.5–15.5)
WBC Count: 7.4 10*3/uL (ref 4.0–10.5)
nRBC: 0 % (ref 0.0–0.2)

## 2019-11-19 LAB — CMP (CANCER CENTER ONLY)
ALT: 16 U/L (ref 0–44)
AST: 16 U/L (ref 15–41)
Albumin: 3.8 g/dL (ref 3.5–5.0)
Alkaline Phosphatase: 45 U/L (ref 38–126)
Anion gap: 5 (ref 5–15)
BUN: 17 mg/dL (ref 8–23)
CO2: 28 mmol/L (ref 22–32)
Calcium: 9.6 mg/dL (ref 8.9–10.3)
Chloride: 106 mmol/L (ref 98–111)
Creatinine: 1.12 mg/dL — ABNORMAL HIGH (ref 0.44–1.00)
GFR, Estimated: 45 mL/min — ABNORMAL LOW (ref >60)
Glucose, Bld: 117 mg/dL — ABNORMAL HIGH (ref 70–99)
Potassium: 4.4 mmol/L (ref 3.5–5.1)
Sodium: 139 mmol/L (ref 135–145)
Total Bilirubin: 1.1 mg/dL (ref 0.3–1.2)
Total Protein: 7.1 g/dL (ref 6.5–8.1)

## 2019-11-19 LAB — MAGNESIUM: Magnesium: 1.9 mg/dL (ref 1.7–2.4)

## 2019-11-19 MED ORDER — INFLUENZA VAC A&B SA ADJ QUAD 0.5 ML IM PRSY
0.5000 mL | PREFILLED_SYRINGE | Freq: Once | INTRAMUSCULAR | Status: AC
  Administered 2019-11-19: 0.5 mL via INTRAMUSCULAR

## 2019-11-19 MED ORDER — INFLUENZA VAC A&B SA ADJ QUAD 0.5 ML IM PRSY
PREFILLED_SYRINGE | INTRAMUSCULAR | Status: AC
  Filled 2019-11-19: qty 0.5

## 2019-11-19 NOTE — Progress Notes (Signed)
[place] OFFICE PROGRESS NOTE   Diagnosis: CML  INTERVAL HISTORY:   [name] returns as scheduled.  She continues nilotinib.  No new complaint.  No rash or diarrhea.  She feels well.  Vision has improved following cataract surgery.  Objective:  Vital signs in last 24 hours:  Blood pressure (!) 161/69, pulse 70, temperature 98.2 F (36.8 C), temperature source Tympanic, resp. rate 16, height 5\' 3"  (1.6 m), weight 124 lb 14.4 oz (56.7 kg), SpO2 100 %.    HEENT: No thrush or ulcers, mild periorbital edema Resp: Lungs clear bilaterally Cardio: Regular rate and rhythm GI: No hepatosplenomegaly Vascular: No leg edema Skin: No rash  Lab Results:  Lab Results  Component Value Date   WBC 7.4 11/19/2019   HGB 13.7 11/19/2019   HCT 40.0 11/19/2019   MCV 88.1 11/19/2019   PLT 233 11/19/2019   NEUTROABS 5.3 11/19/2019    CMP  Lab Results  Component Value Date   NA 139 11/19/2019   K 4.4 11/19/2019   CL 106 11/19/2019   CO2 28 11/19/2019   GLUCOSE 117 (H) 11/19/2019   BUN 17 11/19/2019   CREATININE 1.12 (H) 11/19/2019   CALCIUM 9.6 11/19/2019   PROT 7.1 11/19/2019   ALBUMIN 3.8 11/19/2019   AST 16 11/19/2019   ALT 16 11/19/2019   ALKPHOS 45 11/19/2019   BILITOT 1.1 11/19/2019   GFRNONAA 45 (L) 11/19/2019   GFRAA 48 (L) 07/23/2019   QT 462, QTc 453 Medications: I have reviewed the patient's current medications.   Assessment/Plan: 1. Chronic myelogenous leukemia -- initially treated with hydroxyurea. She began [place] on 08/27/2009. Gleevec was placed on hold on 10/06/2009 due to poor tolerance of the 400 mg daily dose. Gleevec was restarted when she had hematologic improvement on 10/13/2009 and the dose was changed to 300 mg on 10/26/2009. The Gleevec dose was reduced to 200 mg daily due to excessive "tearing". The peripheral blood BCR/ABL returned at 16% on 04/19/2010. Gleevec was discontinued secondary to toxicity and a persistent elevation of the  peripheral blood translocation . She began Nilotinib on 06/09/2011. Nilotinib placed on hold beginning 08/04/2011 due to fatigue/dyspnea on exertion and slight QT prolongation. Nilotinib was resumed on 08/15/2011 at a dose of 300 mg daily. Peripheral blood PCR was negative on 12/27/2011 and 05/14/2012. Mild elevation of the peripheral blood PCR on 10/02/2012. Improved on repeat peripheral blood PCR 01/01/2013. Stable 11/25/2014. Improved 05/26/2015.Stable 08/25/2015.Improved 11/24/2015.  Patient self reduced dose of Nilotinibfrom 300 mg daily to 150 mg dailyin December 2018  Nilotinib 200 mg dailyJune2019  Peripheral blood PCR higher on 09/12/2017  Nilotinib increased to 300 mg daily beginning 09/27/2017  Peripheral blood PCR improved 12/19/2017, stable on 03/20/2018, stable 07/24/2018, stable 11/20/2018, stable 03/27/2019 and 07/26/2019 2. Diarrhea. Intermittent, mild. 3. Periorbital edema -improved since stopping Gleevec. 4. Increased "tearing"-improved with discontinuance of Gleevec.  5. History of a fine papular rash over the trunk and legs-likely secondary to the nilotinib.  6. Mild QT prolongation and multifocal atrial pacemaker on EKG 08/04/2011. QT normal on EKG 08/26/2014. QTc normal 05/26/2015,08/25/2015, 02/23/2016, 06/13/2017, 11/19/2019 7. History of mild hypokalemia -maintained on oral potassium replacement. Potassium in normal range on labs today. 8. Hypertension    Disposition: [name] remains in hematologic remission from [facility].  She will continue nilotinib.  We will follow up on the peripheral blood PCR from today.  She received an influenza vaccine today.  The QT interval is normal today.  [name] will  return for an office and lab visit in 4 months.  Betsy Coder, MD  11/19/2019  11:45 AM

## 2019-11-20 ENCOUNTER — Telehealth: Payer: Self-pay | Admitting: Oncology

## 2019-11-20 NOTE — Telephone Encounter (Signed)
No new appointments made. No check out notes provided in 10/12 los.

## 2019-11-26 LAB — BCR/ABL

## 2019-12-05 ENCOUNTER — Telehealth: Payer: Self-pay | Admitting: *Deleted

## 2019-12-05 NOTE — Telephone Encounter (Signed)
-----   Message from Ladell Pier, MD sent at 12/03/2019  7:44 PM EDT ----- Please call patient, PCR is stable, continue nilotinib, f/u as scheduled

## 2019-12-05 NOTE — Telephone Encounter (Signed)
Notified of stable PCR results and to continue nilotinib.

## 2019-12-19 ENCOUNTER — Telehealth: Payer: Self-pay | Admitting: *Deleted

## 2019-12-19 DIAGNOSIS — C9210 Chronic myeloid leukemia, BCR/ABL-positive, not having achieved remission: Secondary | ICD-10-CM

## 2019-12-19 MED ORDER — POTASSIUM CHLORIDE ER 10 MEQ PO CPCR
10.0000 meq | ORAL_CAPSULE | Freq: Every day | ORAL | 1 refills | Status: DC
Start: 2019-12-19 — End: 2020-04-15

## 2019-12-19 NOTE — Telephone Encounter (Signed)
Called to inquire about her 4 month lab/OV that was ordered on 11/19/19? Also needs refill on K+ and wants a 90 day supply. Sent scheduling message for appointment w/her request for a Tuesday ~ 10:00. Refill sent.

## 2019-12-20 ENCOUNTER — Telehealth: Payer: Self-pay | Admitting: Oncology

## 2019-12-20 NOTE — Telephone Encounter (Signed)
Scheduled appt per 11/11 sch msg - pt is aware  appt date and time

## 2020-01-13 ENCOUNTER — Telehealth: Payer: Self-pay

## 2020-01-14 NOTE — Telephone Encounter (Signed)
Oral Oncology Patient Advocate Encounter  Met patient in [place] to complete a re-enrollment application for [employer] [facility] [place] ([employer]) in an effort to reduce the patient's out of pocket expense for Tasigna to $0.    Application completed and faxed to [phone].   [employer] phone number for follow up is [phone].   This encounter will be updated until final determination.    [place] [facility] [place] Phone [phone] Fax [phone] 01/14/2020 10:40 AM

## 2020-02-05 ENCOUNTER — Other Ambulatory Visit: Payer: Self-pay | Admitting: *Deleted

## 2020-02-05 DIAGNOSIS — C9210 Chronic myeloid leukemia, BCR/ABL-positive, not having achieved remission: Secondary | ICD-10-CM

## 2020-02-05 MED ORDER — NILOTINIB HCL 150 MG PO CAPS: ORAL_CAPSULE | ORAL | 2 refills | Status: DC

## 2020-02-14 NOTE — Telephone Encounter (Signed)
Patient is approved for Tasigna at no charge from [employer] 02/08/20-02/06/21  [place] [facility] [place] Phone [phone] Fax [phone] 02/14/2020 4:32 PM

## 2020-03-17 ENCOUNTER — Other Ambulatory Visit: Payer: Self-pay

## 2020-03-17 ENCOUNTER — Inpatient Hospital Stay (HOSPITAL_BASED_OUTPATIENT_CLINIC_OR_DEPARTMENT_OTHER): Payer: Medicare HMO | Admitting: Physician Assistant

## 2020-03-17 ENCOUNTER — Ambulatory Visit (HOSPITAL_BASED_OUTPATIENT_CLINIC_OR_DEPARTMENT_OTHER): Payer: Medicare HMO

## 2020-03-17 ENCOUNTER — Inpatient Hospital Stay: Payer: Medicare HMO | Attending: Physician Assistant

## 2020-03-17 ENCOUNTER — Ambulatory Visit: Payer: Medicare Other | Admitting: Oncology

## 2020-03-17 ENCOUNTER — Other Ambulatory Visit: Payer: Medicare Other

## 2020-03-17 ENCOUNTER — Telehealth: Payer: Self-pay | Admitting: Physician Assistant

## 2020-03-17 VITALS — BP 169/77 | HR 67 | Temp 97.7°F | Resp 20 | Ht 63.0 in | Wt 123.3 lb

## 2020-03-17 DIAGNOSIS — Z23 Encounter for immunization: Secondary | ICD-10-CM | POA: Diagnosis not present

## 2020-03-17 DIAGNOSIS — R197 Diarrhea, unspecified: Secondary | ICD-10-CM | POA: Diagnosis not present

## 2020-03-17 DIAGNOSIS — C9210 Chronic myeloid leukemia, BCR/ABL-positive, not having achieved remission: Secondary | ICD-10-CM

## 2020-03-17 LAB — CMP (CANCER CENTER ONLY)
ALT: 20 U/L (ref 0–44)
AST: 18 U/L (ref 15–41)
Albumin: 4.2 g/dL (ref 3.5–5.0)
Alkaline Phosphatase: 39 U/L (ref 38–126)
Anion gap: 6 (ref 5–15)
BUN: 14 mg/dL (ref 8–23)
CO2: 24 mmol/L (ref 22–32)
Calcium: 9.4 mg/dL (ref 8.9–10.3)
Chloride: 108 mmol/L (ref 98–111)
Creatinine: 1.18 mg/dL — ABNORMAL HIGH (ref 0.44–1.00)
GFR, Estimated: 45 mL/min — ABNORMAL LOW (ref >60)
Glucose, Bld: 125 mg/dL — ABNORMAL HIGH (ref 70–99)
Potassium: 4.1 mmol/L (ref 3.5–5.1)
Sodium: 138 mmol/L (ref 135–145)
Total Bilirubin: 1.1 mg/dL (ref 0.3–1.2)
Total Protein: 7.4 g/dL (ref 6.5–8.1)

## 2020-03-17 LAB — CBC WITH DIFFERENTIAL (CANCER CENTER ONLY)
Abs Immature Granulocytes: 0.03 10*3/uL (ref 0.00–0.07)
Basophils Absolute: 0.1 10*3/uL (ref 0.0–0.1)
Basophils Relative: 1 %
Eosinophils Absolute: 0.3 10*3/uL (ref 0.0–0.5)
Eosinophils Relative: 4 %
HCT: 41.3 % (ref 36.0–46.0)
Hemoglobin: 14.1 g/dL (ref 12.0–15.0)
Immature Granulocytes: 0 %
Lymphocytes Relative: 18 %
Lymphs Abs: 1.3 10*3/uL (ref 0.7–4.0)
MCH: 30.5 pg (ref 26.0–34.0)
MCHC: 34.1 g/dL (ref 30.0–36.0)
MCV: 89.2 fL (ref 80.0–100.0)
Monocytes Absolute: 0.5 10*3/uL (ref 0.1–1.0)
Monocytes Relative: 7 %
Neutro Abs: 5.1 10*3/uL (ref 1.7–7.7)
Neutrophils Relative %: 70 %
Platelet Count: 256 10*3/uL (ref 150–400)
RBC: 4.63 MIL/uL (ref 3.87–5.11)
RDW: 12.9 % (ref 11.5–15.5)
WBC Count: 7.3 10*3/uL (ref 4.0–10.5)
nRBC: 0 % (ref 0.0–0.2)

## 2020-03-17 NOTE — Progress Notes (Signed)
[facility] OFFICE PROGRESS NOTE  Mayra Neer, MD [address] [place] [address]   DIAGNOSIS: Chronic myelogenous leukemia   CURRENT THERAPY: Nilotinib increased to 300 mg daily.  INTERVAL HISTORY: [name] 87 y.o. female returns to the clinic today for a follow-up visit.  The patient is feeling well today without any concerning complaints.  The patient is curious if she can have her COVID-19 booster vaccine while in the clinic today.  She received two doses of the Moderna vaccine, her second dose being in March of last year.  She is also curious if it is okay for her to take Tylenol PM.  Otherwise, the patient denies any concerning complaints today or any changes in her health.  She denies any fever, chills, night sweats, or unexplained weight loss.  She denies any rashes or skin changes.  She denies any diarrhea.  She denies any recent signs and symptoms of infection including sore throat, nasal congestion, cough, skin infections, or dysuria.  The patient last had an EKG at her last appointment in October 2021 per Dr. Gearldine Shown office note to check for Qtc.  The patient denies any abnormal bleeding or bruising.  The patient is concerned due to her blood sugar being slightly elevated on her lab work today.  The patient was checked for diabetes mellitus in the past through her PCP but previously did not meet all criteria.  She is scheduled for follow-up visit with her PCP in March 2022.  She has not eaten yet this morning.  She is inquiring what she can do to help her blood sugar.  The patient is here today for evaluation and repeat blood work.   MEDICAL HISTORY: Past Medical History:  Diagnosis Date  . Anxiety   . Chronic myeloid leukemia 07/2009  . Hyperlipemia   . Hypertension   . Osteoporosis   . Pancreatitis, gallstone 2009    ALLERGIES:  has No Known Allergies.  MEDICATIONS:  Current Outpatient Medications  Medication Sig Dispense  Refill  . amLODipine (NORVASC) 5 MG tablet Take 5 mg by mouth daily.    Marland Kitchen aspirin 325 MG tablet Take 162 mg by mouth daily. Takes 1/2 tab    . atenolol-chlorthalidone (TENORETIC) 50-25 MG per tablet Take 0.5 tablets by mouth Daily.     . Calcium Carbonate-Vitamin D (CALCIUM 600 + D PO) Take 1 tablet by mouth daily.    . citalopram (CELEXA) 10 MG tablet Take 10 mg by mouth daily.    . fish oil-omega-3 fatty acids 1000 MG capsule Take 2 g by mouth daily.    Marland Kitchen lovastatin (MEVACOR) 20 MG tablet Take 20 mg by mouth at bedtime.    . nilotinib (TASIGNA) 150 MG capsule TAKE 2 CAPSULES BY MOUTH  DAILY ON AN EMPTY STOMACH 1 HOUR BEFORE OR 2 HOURS  AFTER A MEAL 56 capsule 2  . potassium chloride (MICRO-K) 10 MEQ CR capsule Take 1 capsule (10 mEq total) by mouth daily. 90 capsule 1  . pyridOXINE (B-6) 50 MG tablet Take 50 mg by mouth daily.     No current facility-administered medications for this visit.    SURGICAL HISTORY:  Past Surgical History:  Procedure Laterality Date  . CHOLECYSTECTOMY  12/2007  . TONSILLECTOMY     age 38    REVIEW OF SYSTEMS:   Review of Systems  Constitutional: Negative for appetite change, chills, fatigue, fever and unexpected weight change.  HENT: Negative for mouth sores, nosebleeds,  sore throat and trouble swallowing.   Eyes: Negative for eye problems and icterus.  Respiratory: Negative for cough, hemoptysis, shortness of breath and wheezing.   Cardiovascular: Negative for chest pain and leg swelling.  Gastrointestinal: Negative for abdominal pain, constipation, diarrhea, nausea and vomiting.  Genitourinary: Negative for bladder incontinence, difficulty urinating, dysuria, frequency and hematuria.   Musculoskeletal: Negative for back pain, gait problem, neck pain and neck stiffness.  Skin: Negative for itching and rash.  Neurological: Negative for dizziness, extremity weakness, gait problem, headaches, light-headedness and seizures.  Hematological: Negative for  adenopathy. Does not bruise/bleed easily.  Psychiatric/Behavioral: Negative for confusion, depression and sleep disturbance. The patient is not nervous/anxious.     PHYSICAL EXAMINATION:  Blood pressure (!) 169/77, pulse 67, temperature 97.7 F (36.5 C), temperature source Tympanic, resp. rate 20, height 5\' 3"  (1.6 m), weight 123 lb 4.8 oz (55.9 kg), SpO2 100 %.  ECOG PERFORMANCE STATUS: 1 - Symptomatic but completely ambulatory  Physical Exam  Constitutional: Oriented to person, place, and time and well-developed, well-nourished, and in no distress.  HENT:  Head: Normocephalic and atraumatic.  Mouth/Throat: Oropharynx is clear and moist. No oropharyngeal exudate.  Eyes: Conjunctivae are normal. Right eye exhibits no discharge. Left eye exhibits no discharge. No scleral icterus.  Neck: Normal range of motion. Neck supple.  Cardiovascular: Normal rate, regular rhythm, normal heart sounds and intact distal pulses.   Pulmonary/Chest: Effort normal and breath sounds normal. No respiratory distress. No wheezes. No rales.  Abdominal: Soft. Bowel sounds are normal. Exhibits no distension and no mass. There is no tenderness.  Musculoskeletal: Normal range of motion. Exhibits no edema.  Lymphadenopathy:    No cervical adenopathy.  Neurological: Alert and oriented to person, place, and time. Exhibits normal muscle tone. Gait normal. Coordination normal.  Skin: Skin is warm and dry. No rash noted. Not diaphoretic. No erythema. No pallor.  Psychiatric: Mood, memory and judgment normal.  Vitals reviewed.  LABORATORY DATA: Lab Results  Component Value Date   WBC 7.3 03/17/2020   HGB 14.1 03/17/2020   HCT 41.3 03/17/2020   MCV 89.2 03/17/2020   PLT 256 03/17/2020      Chemistry      Component Value Date/Time   NA 138 03/17/2020 0941   NA 141 12/13/2016 0948   K 4.1 03/17/2020 0941   K 3.8 12/13/2016 0948   CL 108 03/17/2020 0941   CL 107 05/14/2012 0958   CO2 24 03/17/2020 0941   CO2  26 12/13/2016 0948   BUN 14 03/17/2020 0941   BUN 15.9 12/13/2016 0948   CREATININE 1.18 (H) 03/17/2020 0941   CREATININE 1.0 12/13/2016 0948      Component Value Date/Time   CALCIUM 9.4 03/17/2020 0941   CALCIUM 9.6 12/13/2016 0948   ALKPHOS 39 03/17/2020 0941   ALKPHOS 38 (L) 12/13/2016 0948   AST 18 03/17/2020 0941   AST 17 12/13/2016 0948   ALT 20 03/17/2020 0941   ALT 18 12/13/2016 0948   BILITOT 1.1 03/17/2020 0941   BILITOT 0.79 12/13/2016 0948       RADIOGRAPHIC STUDIES:  No results found.   ASSESSMENT/PLAN:  1. Chronic myelogenous leukemia -- initially treated with hydroxyurea. She began [place] on 08/27/2009. Gleevec was placed on hold on 10/06/2009 due to poor tolerance of the 400 mg daily dose. Gleevec was restarted when she had hematologic improvement on 10/13/2009 and the dose was changed to 300 mg on 10/26/2009. The Gleevec dose was reduced to 200 mg daily  due to excessive "tearing". The peripheral blood BCR/ABL returned at 16% on 04/19/2010. Gleevec was discontinued secondary to toxicity and a persistent elevation of the peripheral blood translocation . She began Nilotinib on 06/09/2011. Nilotinib placed on hold beginning 08/04/2011 due to fatigue/dyspnea on exertion and slight QT prolongation. Nilotinib was resumed on 08/15/2011 at a dose of 300 mg daily. Peripheral blood PCR was negative on 12/27/2011 and 05/14/2012. Mild elevation of the peripheral blood PCR on 10/02/2012. Improved on repeat peripheral blood PCR 01/01/2013. Stable 11/25/2014. Improved 05/26/2015.Stable 08/25/2015.Improved 11/24/2015.  Patient self reduced dose of Nilotinibfrom 300 mg daily to 150 mg dailyin December 2018  Nilotinib 200 mg dailyJune2019  Peripheral blood PCR higher on 09/12/2017  Nilotinib increased to 300 mg daily beginning 09/27/2017  Peripheral blood PCR improved 12/19/2017, stable on 03/20/2018, stable 07/24/2018, stable 11/20/2018, stable 03/27/2019 and  07/26/2019 2. Diarrhea. Intermittent, mild. 3. Periorbital edema -improved since stopping Gleevec. 4. Increased "tearing"-improved with discontinuance of Gleevec.  5. History of a fine papular rash over the trunk and legs-likely secondary to the nilotinib.  6. Mild QT prolongation and multifocal atrial pacemaker on EKG 08/04/2011. QT normal on EKG 08/26/2014. QTc normal 05/26/2015,08/25/2015, 02/23/2016, 06/13/2017, 11/19/2019 7. History of mild hypokalemia -maintained on oral potassium replacement. Potassium in normal range on labs today. 8. Hypertension  Disposition: Ms. Mokry is doing well today and has been in hematologic remission from her CML.  Her BCR/ABL testing is pending from today and we will call her with the results.  Labs were reviewed with the patient today.  She will continue taking nilotinib at the same dose.  We will arrange for the patient to have a COVID-19 booster vaccine while in the clinic today.  Discussed the patient with Dr. Benay Spice.  We will arrange for the patient's next follow-up/lab visit in 4 months.  Per Dr. Benay Spice, we will recheck her QT interval at her next appointment. Per his office note from 11/19/19, it was rechecked at this appointment and was normal.  Regarding the patient's hypertension and slightly elevated blood sugar at 125 this morning, despite not eating in 8+ hours, I instructed the patient to discuss with her primary care visit at her appointment next month.  Discussed with her that the best lifestyle management for her blood sugar is activity/exercise as well as dietary changes.     Orders Placed This Encounter  Procedures  . CBC with Differential (Cancer Center Only)    Standing Status:   Future    Standing Expiration Date:   03/17/2021  . CMP ([facility] only)    Standing Status:   Future    Standing Expiration Date:   03/17/2021  . bcr/abl-PCR    Standing Status:   Future    Standing Expiration Date:   03/17/2021     I spent 20-29  minutes in this encounter   Alantra Popoca L Mattthew Ziomek, PA-C 03/17/20

## 2020-03-17 NOTE — Progress Notes (Signed)
    Covid-19 Vaccination Clinic  Name:  [name]    MRN: [id number] DOB: 1932/02/17  03/17/2020  [name] was observed post Covid-19 immunization for 15 minutes without incident. She was provided with Vaccine Information Sheet and instruction to access the V-Safe system.   [name] was instructed to call 911 with any severe reactions post vaccine: Marland Kitchen Difficulty breathing  . Swelling of face and throat  . A fast heartbeat  . A bad rash all over body  . Dizziness and weakness   Immunizations Administered    Name Date Dose VIS Date Route   PFIZER Comrnaty(Gray TOP) Covid-19 Vaccine 03/17/2020 11:50 AM 0.3 mL 01/16/2020 Intramuscular   Manufacturer: [employer]   Lot: [id number]   [place]: [phone]

## 2020-03-17 NOTE — Telephone Encounter (Signed)
Scheduled appointments per 2/8 los. Spoke to patient who is aware of appointments date and times. Gave patient calendar print out.

## 2020-03-23 LAB — BCR/ABL

## 2020-03-24 ENCOUNTER — Telehealth: Payer: Self-pay | Admitting: Oncology

## 2020-03-24 NOTE — Telephone Encounter (Signed)
Contacted patient about rescheduled appointment. Patient is aware.

## 2020-04-15 ENCOUNTER — Other Ambulatory Visit: Payer: Self-pay | Admitting: *Deleted

## 2020-04-15 MED ORDER — POTASSIUM CHLORIDE ER 10 MEQ PO CPCR: 10.0000 meq | ORAL_CAPSULE | Freq: Every day | ORAL | 1 refills | Status: DC

## 2020-04-15 NOTE — Telephone Encounter (Signed)
Faxed request from [facility] order pharmacy that patient would like her KCL obtained from them.

## 2020-04-17 ENCOUNTER — Telehealth: Payer: Self-pay | Admitting: *Deleted

## 2020-04-17 NOTE — Telephone Encounter (Signed)
Error

## 2020-04-27 ENCOUNTER — Other Ambulatory Visit: Payer: Self-pay | Admitting: *Deleted

## 2020-04-27 DIAGNOSIS — C9210 Chronic myeloid leukemia, BCR/ABL-positive, not having achieved remission: Secondary | ICD-10-CM

## 2020-04-27 MED ORDER — NILOTINIB HCL 150 MG PO CAPS: ORAL_CAPSULE | ORAL | 2 refills | Status: DC

## 2020-04-27 NOTE — Telephone Encounter (Signed)
Received request for refill on Tasigna (nilotinib).

## 2020-05-06 DIAGNOSIS — I129 Hypertensive chronic kidney disease with stage 1 through stage 4 chronic kidney disease, or unspecified chronic kidney disease: Secondary | ICD-10-CM | POA: Diagnosis not present

## 2020-05-06 DIAGNOSIS — C9210 Chronic myeloid leukemia, BCR/ABL-positive, not having achieved remission: Secondary | ICD-10-CM | POA: Diagnosis not present

## 2020-05-06 DIAGNOSIS — E782 Mixed hyperlipidemia: Secondary | ICD-10-CM | POA: Diagnosis not present

## 2020-05-06 DIAGNOSIS — M810 Age-related osteoporosis without current pathological fracture: Secondary | ICD-10-CM | POA: Diagnosis not present

## 2020-05-06 DIAGNOSIS — F322 Major depressive disorder, single episode, severe without psychotic features: Secondary | ICD-10-CM | POA: Diagnosis not present

## 2020-05-06 DIAGNOSIS — N1831 Chronic kidney disease, stage 3a: Secondary | ICD-10-CM | POA: Diagnosis not present

## 2020-05-06 DIAGNOSIS — J309 Allergic rhinitis, unspecified: Secondary | ICD-10-CM | POA: Diagnosis not present

## 2020-05-06 DIAGNOSIS — E1122 Type 2 diabetes mellitus with diabetic chronic kidney disease: Secondary | ICD-10-CM | POA: Diagnosis not present

## 2020-05-06 DIAGNOSIS — Z0000 Encounter for general adult medical examination without abnormal findings: Secondary | ICD-10-CM | POA: Diagnosis not present

## 2020-07-14 ENCOUNTER — Other Ambulatory Visit: Payer: Medicare HMO

## 2020-07-14 ENCOUNTER — Ambulatory Visit: Payer: Medicare HMO | Admitting: Oncology

## 2020-07-16 ENCOUNTER — Other Ambulatory Visit: Payer: Self-pay | Admitting: *Deleted

## 2020-07-16 DIAGNOSIS — C9210 Chronic myeloid leukemia, BCR/ABL-positive, not having achieved remission: Secondary | ICD-10-CM

## 2020-07-16 MED ORDER — NILOTINIB HCL 150 MG PO CAPS: ORAL_CAPSULE | ORAL | 2 refills | Status: DC

## 2020-07-16 NOTE — Telephone Encounter (Signed)
Received faxed request from [facility] by [facility] for Tasigna refill.

## 2020-07-21 ENCOUNTER — Inpatient Hospital Stay (HOSPITAL_BASED_OUTPATIENT_CLINIC_OR_DEPARTMENT_OTHER): Payer: Medicare HMO | Admitting: Oncology

## 2020-07-21 ENCOUNTER — Other Ambulatory Visit: Payer: Self-pay

## 2020-07-21 ENCOUNTER — Inpatient Hospital Stay: Payer: Medicare HMO | Attending: Oncology

## 2020-07-21 ENCOUNTER — Other Ambulatory Visit (HOSPITAL_BASED_OUTPATIENT_CLINIC_OR_DEPARTMENT_OTHER): Payer: Self-pay

## 2020-07-21 ENCOUNTER — Ambulatory Visit: Payer: Medicare HMO | Admitting: Oncology

## 2020-07-21 ENCOUNTER — Other Ambulatory Visit: Payer: Medicare HMO

## 2020-07-21 ENCOUNTER — Ambulatory Visit: Payer: Medicare HMO | Attending: Internal Medicine

## 2020-07-21 VITALS — BP 155/68 | HR 57 | Temp 97.8°F | Resp 18 | Ht 63.0 in | Wt 123.0 lb

## 2020-07-21 DIAGNOSIS — C9210 Chronic myeloid leukemia, BCR/ABL-positive, not having achieved remission: Secondary | ICD-10-CM | POA: Diagnosis not present

## 2020-07-21 DIAGNOSIS — Z23 Encounter for immunization: Secondary | ICD-10-CM

## 2020-07-21 LAB — CBC WITH DIFFERENTIAL (CANCER CENTER ONLY)
Abs Immature Granulocytes: 0.03 10*3/uL (ref 0.00–0.07)
Basophils Absolute: 0.1 10*3/uL (ref 0.0–0.1)
Basophils Relative: 1 %
Eosinophils Absolute: 0.2 10*3/uL (ref 0.0–0.5)
Eosinophils Relative: 3 %
HCT: 41.5 % (ref 36.0–46.0)
Hemoglobin: 14.4 g/dL (ref 12.0–15.0)
Immature Granulocytes: 0 %
Lymphocytes Relative: 21 %
Lymphs Abs: 1.4 10*3/uL (ref 0.7–4.0)
MCH: 30.5 pg (ref 26.0–34.0)
MCHC: 34.7 g/dL (ref 30.0–36.0)
MCV: 87.9 fL (ref 80.0–100.0)
Monocytes Absolute: 0.5 10*3/uL (ref 0.1–1.0)
Monocytes Relative: 8 %
Neutro Abs: 4.5 10*3/uL (ref 1.7–7.7)
Neutrophils Relative %: 67 %
Platelet Count: 241 10*3/uL (ref 150–400)
RBC: 4.72 MIL/uL (ref 3.87–5.11)
RDW: 12.6 % (ref 11.5–15.5)
WBC Count: 6.7 10*3/uL (ref 4.0–10.5)
nRBC: 0 % (ref 0.0–0.2)

## 2020-07-21 LAB — CMP (CANCER CENTER ONLY)
ALT: 12 U/L (ref 0–44)
AST: 16 U/L (ref 15–41)
Albumin: 4.4 g/dL (ref 3.5–5.0)
Alkaline Phosphatase: 35 U/L — ABNORMAL LOW (ref 38–126)
Anion gap: 9 (ref 5–15)
BUN: 20 mg/dL (ref 8–23)
CO2: 27 mmol/L (ref 22–32)
Calcium: 10 mg/dL (ref 8.9–10.3)
Chloride: 102 mmol/L (ref 98–111)
Creatinine: 1.24 mg/dL — ABNORMAL HIGH (ref 0.44–1.00)
GFR, Estimated: 42 mL/min — ABNORMAL LOW (ref >60)
Glucose, Bld: 119 mg/dL — ABNORMAL HIGH (ref 70–99)
Potassium: 4.3 mmol/L (ref 3.5–5.1)
Sodium: 138 mmol/L (ref 135–145)
Total Bilirubin: 1.5 mg/dL — ABNORMAL HIGH (ref 0.3–1.2)
Total Protein: 7.4 g/dL (ref 6.5–8.1)

## 2020-07-21 MED ORDER — PFIZER-BIONT COVID-19 VAC-TRIS 30 MCG/0.3ML IM SUSP
INTRAMUSCULAR | 0 refills | Status: DC
  Filled 2020-07-21: qty 0.3, 1d supply, fill #0

## 2020-07-21 NOTE — Progress Notes (Signed)
[name] OFFICE VISIT PROGRESS NOTE  I connected with [place] on 07/21/20 at 10:40 AM EDT by video enabled telemedicine visit and verified that I am speaking with the correct person using two identifiers.   I discussed the limitations, risks, security and privacy concerns of performing an evaluation and management service by telemedicine and the availability of in-person appointments. I also discussed with the patient that there may be a patient responsible charge related to this service. The patient expressed understanding and agreed to proceed.   Patient's location: Office Provider's location: Home   Diagnosis: CML  INTERVAL HISTORY:   [name] returns as scheduled.  She continues nilotinib.  The peripheral blood PCR was stable in February.  No rash, swelling, or diarrhea.  No dyspnea.  No recent infection.  Objective:  Vital signs in last 24 hours:  Blood pressure (!) 155/68, pulse (!) 57, temperature 97.8 F (36.6 C), temperature source Oral, resp. rate 18, height 5\' 3"  (1.6 m), weight 123 lb (55.8 kg), SpO2 97 %.    Lab Results:  Lab Results  Component Value Date   WBC 6.7 07/21/2020   HGB 14.4 07/21/2020   HCT 41.5 07/21/2020   MCV 87.9 07/21/2020   PLT 241 07/21/2020   NEUTROABS 4.5 07/21/2020    Medications: I have reviewed the patient's current medications.  Assessment/Plan: Chronic myelogenous leukemia -- initially treated with hydroxyurea. She began [place] on 08/27/2009. Gleevec was placed on hold on 10/06/2009 due to poor tolerance of the 400 mg daily dose. Gleevec was restarted when she had hematologic improvement on 10/13/2009 and the dose was changed to 300 mg on 10/26/2009. The Gleevec dose was reduced to 200 mg daily due to excessive "tearing". The peripheral blood BCR/ABL returned at 16% on 04/19/2010. Gleevec was discontinued secondary to toxicity and a persistent elevation of the peripheral  blood translocation . She began Nilotinib on 06/09/2011. Nilotinib placed on hold beginning 08/04/2011 due to fatigue/dyspnea on exertion and slight QT prolongation. Nilotinib was resumed on 08/15/2011 at a dose of 300 mg daily. Peripheral blood PCR was negative on 12/27/2011 and 05/14/2012. Mild elevation of the peripheral blood PCR on 10/02/2012. Improved on repeat peripheral blood PCR 01/01/2013. Stable 11/25/2014. Improved 05/26/2015. Stable 08/25/2015. Improved 11/24/2015. Patient self reduced dose of Nilotinib from 300 mg daily to 150 mg daily in December 2018 Nilotinib 200 mg daily June 2019 Peripheral blood PCR higher on 09/12/2017 Nilotinib increased to 300 mg daily beginning 09/27/2017 Peripheral blood PCR improved 12/19/2017, stable on 03/20/2018, stable 07/24/2018, stable 11/20/2018, stable 03/27/2019, 07/26/2019, 11/20/2019, 03/17/2020 Diarrhea. Intermittent, mild. Periorbital edema -improved since stopping Gleevec. Increased "tearing"-improved with discontinuance of Gleevec.   History of a fine papular rash over the trunk and legs-likely secondary to the nilotinib.   Mild QT prolongation and multifocal atrial pacemaker on EKG 08/04/2011. QT normal on EKG 08/26/2014. QTc normal 05/26/2015,08/25/2015,  02/23/2016 , 06/13/2017, 11/19/2019 History of mild hypokalemia -maintained on oral potassium replacement. Potassium in normal range on labs today. Hypertension     Disposition: [name] appears unchanged.  She is tolerating the nilotinib well.  We will follow-up on the peripheral blood PCR from today.  She remains in hematologic remission from CML.  The PCR has been stable for the past several years.  She will return for an office and lab visit in 4 months.  We will check an EKG when he is here in 4 months.  [name] will receive another COVID-19 booster  vaccine today.   I discussed the assessment and treatment plan with the patient. The patient was provided an opportunity to ask questions  and all were answered. The patient agreed with the plan and demonstrated an understanding of the instructions.   The patient was advised to call back or seek an in-person evaluation if the symptoms worsen or if the condition fails to improve as anticipated.  I provided 20 minutes of chart review, documentation, and video time during this encounter, and > 50% was spent counseling as documented under my assessment & plan.  Betsy Coder ANP/GNP-BC   07/21/2020 10:49 AM

## 2020-07-21 NOTE — Progress Notes (Signed)
   Covid-19 Vaccination Clinic  Name:  Aniella Wandrey    MRN: [id number] DOB: [birth year]  07/21/2020  Ms. Hajduk was observed post Covid-19 immunization for 15 minutes without incident. She was provided with Vaccine Information Sheet and instruction to access the V-Safe system.   Ms. Shisler was instructed to call 911 with any severe reactions post vaccine: Difficulty breathing  Swelling of face and throat  A fast heartbeat  A bad rash all over body  Dizziness and weakness   Immunizations Administered     Name Date Dose VIS Date Route   PFIZER Comrnaty(Gray TOP) Covid-19 Vaccine 07/21/2020 11:19 AM 0.3 mL 01/16/2020 Intramuscular   Manufacturer: [place]   Lot: [id number]   [place]: [phone]

## 2020-07-27 ENCOUNTER — Encounter: Payer: Self-pay | Admitting: Internal Medicine

## 2020-07-30 LAB — BCR/ABL

## 2020-08-04 ENCOUNTER — Encounter: Payer: Self-pay | Admitting: Internal Medicine

## 2020-08-04 ENCOUNTER — Other Ambulatory Visit: Payer: Self-pay | Admitting: Family Medicine

## 2020-08-04 DIAGNOSIS — Z1231 Encounter for screening mammogram for malignant neoplasm of breast: Secondary | ICD-10-CM

## 2020-08-26 ENCOUNTER — Telehealth: Payer: Self-pay

## 2020-08-26 NOTE — Telephone Encounter (Signed)
Pt informed of no significant change to PCR and to continue same dose of nilotinib. Pt. Verbalized understanding. No further problems or concerns noted.

## 2020-08-26 NOTE — Telephone Encounter (Signed)
-----   Message from Ladell Pier, MD sent at 07/30/2020  9:18 PM EDT ----- Pcr not changed significantly, continue nilotinib same dose,   I cannot see full report in Epic, page with comparison to previous results is missing

## 2020-09-29 ENCOUNTER — Other Ambulatory Visit: Payer: Self-pay

## 2020-09-29 ENCOUNTER — Ambulatory Visit
Admission: RE | Admit: 2020-09-29 | Discharge: 2020-09-29 | Disposition: A | Discharge status: Home / Self Care | Payer: Medicare HMO | Source: Ambulatory Visit | Attending: Family Medicine | Admitting: Family Medicine

## 2020-09-29 DIAGNOSIS — Z1231 Encounter for screening mammogram for malignant neoplasm of breast: Secondary | ICD-10-CM | POA: Diagnosis not present

## 2020-10-07 ENCOUNTER — Other Ambulatory Visit: Payer: Self-pay | Admitting: *Deleted

## 2020-10-07 DIAGNOSIS — C9210 Chronic myeloid leukemia, BCR/ABL-positive, not having achieved remission: Secondary | ICD-10-CM

## 2020-10-07 MED ORDER — NILOTINIB HCL 150 MG PO CAPS: ORAL_CAPSULE | ORAL | 1 refills | Status: DC

## 2020-11-03 ENCOUNTER — Other Ambulatory Visit: Payer: Self-pay | Admitting: Oncology

## 2020-11-24 ENCOUNTER — Other Ambulatory Visit: Payer: Self-pay

## 2020-11-24 ENCOUNTER — Inpatient Hospital Stay: Payer: Medicare HMO | Admitting: Nurse Practitioner

## 2020-11-24 ENCOUNTER — Inpatient Hospital Stay: Payer: Medicare HMO | Attending: Oncology

## 2020-11-24 VITALS — BP 164/80 | HR 80 | Temp 98.1°F | Resp 18 | Ht 63.0 in | Wt 122.6 lb

## 2020-11-24 DIAGNOSIS — C9210 Chronic myeloid leukemia, BCR/ABL-positive, not having achieved remission: Secondary | ICD-10-CM

## 2020-11-24 DIAGNOSIS — Z23 Encounter for immunization: Secondary | ICD-10-CM | POA: Insufficient documentation

## 2020-11-24 LAB — CBC WITH DIFFERENTIAL (CANCER CENTER ONLY)
Abs Immature Granulocytes: 0.05 10*3/uL (ref 0.00–0.07)
Basophils Absolute: 0.1 10*3/uL (ref 0.0–0.1)
Basophils Relative: 1 %
Eosinophils Absolute: 0.2 10*3/uL (ref 0.0–0.5)
Eosinophils Relative: 4 %
HCT: 41 % (ref 36.0–46.0)
Hemoglobin: 14.2 g/dL (ref 12.0–15.0)
Immature Granulocytes: 1 %
Lymphocytes Relative: 23 %
Lymphs Abs: 1.5 10*3/uL (ref 0.7–4.0)
MCH: 30.3 pg (ref 26.0–34.0)
MCHC: 34.6 g/dL (ref 30.0–36.0)
MCV: 87.4 fL (ref 80.0–100.0)
Monocytes Absolute: 0.5 10*3/uL (ref 0.1–1.0)
Monocytes Relative: 7 %
Neutro Abs: 4.2 10*3/uL (ref 1.7–7.7)
Neutrophils Relative %: 64 %
Platelet Count: 243 10*3/uL (ref 150–400)
RBC: 4.69 MIL/uL (ref 3.87–5.11)
RDW: 12.8 % (ref 11.5–15.5)
WBC Count: 6.4 10*3/uL (ref 4.0–10.5)
nRBC: 0 % (ref 0.0–0.2)

## 2020-11-24 LAB — CMP (CANCER CENTER ONLY)
ALT: 20 U/L (ref 0–44)
AST: 20 U/L (ref 15–41)
Albumin: 4.3 g/dL (ref 3.5–5.0)
Alkaline Phosphatase: 32 U/L — ABNORMAL LOW (ref 38–126)
Anion gap: 8 (ref 5–15)
BUN: 17 mg/dL (ref 8–23)
CO2: 26 mmol/L (ref 22–32)
Calcium: 9.7 mg/dL (ref 8.9–10.3)
Chloride: 105 mmol/L (ref 98–111)
Creatinine: 1.11 mg/dL — ABNORMAL HIGH (ref 0.44–1.00)
GFR, Estimated: 48 mL/min — ABNORMAL LOW (ref >60)
Glucose, Bld: 111 mg/dL — ABNORMAL HIGH (ref 70–99)
Potassium: 4.4 mmol/L (ref 3.5–5.1)
Sodium: 139 mmol/L (ref 135–145)
Total Bilirubin: 1 mg/dL (ref 0.3–1.2)
Total Protein: 6.9 g/dL (ref 6.5–8.1)

## 2020-11-24 MED ORDER — NILOTINIB HCL 150 MG PO CAPS: ORAL_CAPSULE | ORAL | 1 refills | Status: DC

## 2020-11-24 MED ORDER — INFLUENZA VAC A&B SA ADJ QUAD 0.5 ML IM PRSY
0.5000 mL | PREFILLED_SYRINGE | Freq: Once | INTRAMUSCULAR | Status: AC
  Administered 2020-11-24: 0.5 mL via INTRAMUSCULAR
  Filled 2020-11-24: qty 0.5

## 2020-11-24 NOTE — Progress Notes (Signed)
  [facility] OFFICE PROGRESS NOTE   Diagnosis: CML  INTERVAL HISTORY:   [name] returns for follow-up.  She continues Nilotinib.  She overall feels well.  She has mild intermittent nausea.  No mouth sores.  No diarrhea.  No rash.  She denies abdominal pain.  No shortness of breath.  She has a good appetite.  Main complaint is feeling "tired".  Objective:  Vital signs in last 24 hours:  Blood pressure (!) 164/80, pulse 80, temperature 98.1 F (36.7 C), temperature source Oral, resp. rate 18, height 5\' 3"  (1.6 m), weight 122 lb 9.6 oz (55.6 kg), SpO2 96 %.    HEENT: No thrush or ulcers. Resp: Lungs clear bilaterally. Cardio: Regular rate and rhythm. GI: Abdomen soft and nontender.  No hepatosplenomegaly. Vascular: No leg edema. Skin: No rash.   Lab Results:  Lab Results  Component Value Date   WBC 6.4 11/24/2020   HGB 14.2 11/24/2020   HCT 41.0 11/24/2020   MCV 87.4 11/24/2020   PLT 243 11/24/2020   NEUTROABS 4.2 11/24/2020    Imaging:  No results found.  Medications: I have reviewed the patient's current medications.  Assessment/Plan: Chronic myelogenous leukemia -- initially treated with hydroxyurea. She began [place] on 08/27/2009. Gleevec was placed on hold on 10/06/2009 due to poor tolerance of the 400 mg daily dose. Gleevec was restarted when she had hematologic improvement on 10/13/2009 and the dose was changed to 300 mg on 10/26/2009. The Gleevec dose was reduced to 200 mg daily due to excessive "tearing". The peripheral blood BCR/ABL returned at 16% on 04/19/2010. Gleevec was discontinued secondary to toxicity and a persistent elevation of the peripheral blood translocation . She began Nilotinib on 06/09/2011. Nilotinib placed on hold beginning 08/04/2011 due to fatigue/dyspnea on exertion and slight QT prolongation. Nilotinib was resumed on 08/15/2011 at a dose of 300 mg daily. Peripheral blood PCR was negative on 12/27/2011 and 05/14/2012. Mild  elevation of the peripheral blood PCR on 10/02/2012. Improved on repeat peripheral blood PCR 01/01/2013. Stable 11/25/2014. Improved 05/26/2015. Stable 08/25/2015. Improved 11/24/2015. Patient self reduced dose of Nilotinib from 300 mg daily to 150 mg daily in December 2018 Nilotinib 200 mg daily June 2019 Peripheral blood PCR higher on 09/12/2017 Nilotinib increased to 300 mg daily beginning 09/27/2017 Peripheral blood PCR improved 12/19/2017, stable on 03/20/2018, stable 07/24/2018, stable 11/20/2018, stable 03/27/2019, 07/26/2019, 11/20/2019, 03/17/2020, 07/21/2020 Diarrhea. Intermittent, mild. Periorbital edema -improved since stopping Gleevec. Increased "tearing"-improved with discontinuance of Gleevec.   History of a fine papular rash over the trunk and legs-likely secondary to the nilotinib.   Mild QT prolongation and multifocal atrial pacemaker on EKG 08/04/2011. QT normal on EKG 08/26/2014. QTc normal 05/26/2015,08/25/2015,  02/23/2016 , 06/13/2017, 11/19/2019 History of mild hypokalemia -maintained on oral potassium replacement. Potassium in normal range on labs today. Hypertension    Disposition: [name] appears stable.  She remains in hematologic remission from CML.  She will continue Nilotinib.  We will follow-up on the PCR from today.  Obtain EKG today.  She will receive the influenza vaccine today.  She will return for lab, follow-up, EKG in 4 months.    [name] ANP/GNP-BC   11/24/2020  12:12 PM

## 2020-12-01 LAB — BCR/ABL

## 2021-01-11 ENCOUNTER — Other Ambulatory Visit: Payer: Self-pay | Admitting: *Deleted

## 2021-01-11 DIAGNOSIS — C9210 Chronic myeloid leukemia, BCR/ABL-positive, not having achieved remission: Secondary | ICD-10-CM

## 2021-01-11 MED ORDER — NILOTINIB HCL 150 MG PO CAPS: ORAL_CAPSULE | ORAL | 1 refills | Status: DC

## 2021-02-12 ENCOUNTER — Telehealth: Payer: Self-pay | Admitting: Pharmacy Technician

## 2021-02-19 NOTE — Telephone Encounter (Signed)
Oral Oncology Patient Advocate Encounter  Received notification from [employer] that patient has been successfully enrolled into their program to receive Tasigna from the manufacturer at $0 out of pocket until 02/06/22.    I called and spoke with patient.  She knows we will have to re-apply.   Specialty Pharmacy that will dispense medication is [facility].  Patient knows to call the office with questions or concerns.   Oral Oncology Clinic will continue to follow.  [place] Patient [place] Phone [phone] Fax [phone] 02/19/2021 2:49 PM

## 2021-03-30 ENCOUNTER — Other Ambulatory Visit: Payer: Self-pay

## 2021-03-30 ENCOUNTER — Encounter: Payer: Self-pay | Admitting: Nurse Practitioner

## 2021-03-30 ENCOUNTER — Inpatient Hospital Stay: Payer: Medicare HMO | Attending: Nurse Practitioner

## 2021-03-30 ENCOUNTER — Other Ambulatory Visit: Payer: Self-pay | Admitting: *Deleted

## 2021-03-30 ENCOUNTER — Inpatient Hospital Stay: Payer: Medicare HMO

## 2021-03-30 ENCOUNTER — Inpatient Hospital Stay: Payer: Medicare HMO | Admitting: Nurse Practitioner

## 2021-03-30 VITALS — BP 161/73 | HR 63 | Temp 97.7°F | Resp 18 | Ht 63.0 in | Wt 123.6 lb

## 2021-03-30 DIAGNOSIS — C9210 Chronic myeloid leukemia, BCR/ABL-positive, not having achieved remission: Secondary | ICD-10-CM

## 2021-03-30 DIAGNOSIS — Z79899 Other long term (current) drug therapy: Secondary | ICD-10-CM | POA: Diagnosis not present

## 2021-03-30 LAB — CBC WITH DIFFERENTIAL (CANCER CENTER ONLY)
Abs Immature Granulocytes: 0.02 10*3/uL (ref 0.00–0.07)
Basophils Absolute: 0.1 10*3/uL (ref 0.0–0.1)
Basophils Relative: 1 %
Eosinophils Absolute: 0.2 10*3/uL (ref 0.0–0.5)
Eosinophils Relative: 3 %
HCT: 40.6 % (ref 36.0–46.0)
Hemoglobin: 13.9 g/dL (ref 12.0–15.0)
Immature Granulocytes: 0 %
Lymphocytes Relative: 18 %
Lymphs Abs: 1.2 10*3/uL (ref 0.7–4.0)
MCH: 30.7 pg (ref 26.0–34.0)
MCHC: 34.2 g/dL (ref 30.0–36.0)
MCV: 89.6 fL (ref 80.0–100.0)
Monocytes Absolute: 0.6 10*3/uL (ref 0.1–1.0)
Monocytes Relative: 8 %
Neutro Abs: 4.8 10*3/uL (ref 1.7–7.7)
Neutrophils Relative %: 70 %
Platelet Count: 255 10*3/uL (ref 150–400)
RBC: 4.53 MIL/uL (ref 3.87–5.11)
RDW: 12.9 % (ref 11.5–15.5)
WBC Count: 6.9 10*3/uL (ref 4.0–10.5)
nRBC: 0 % (ref 0.0–0.2)

## 2021-03-30 LAB — CMP (CANCER CENTER ONLY)
ALT: 12 U/L (ref 0–44)
AST: 13 U/L — ABNORMAL LOW (ref 15–41)
Albumin: 4.5 g/dL (ref 3.5–5.0)
Alkaline Phosphatase: 29 U/L — ABNORMAL LOW (ref 38–126)
Anion gap: 10 (ref 5–15)
BUN: 17 mg/dL (ref 8–23)
CO2: 26 mmol/L (ref 22–32)
Calcium: 9.8 mg/dL (ref 8.9–10.3)
Chloride: 105 mmol/L (ref 98–111)
Creatinine: 1.05 mg/dL — ABNORMAL HIGH (ref 0.44–1.00)
GFR, Estimated: 52 mL/min — ABNORMAL LOW (ref >60)
Glucose, Bld: 112 mg/dL — ABNORMAL HIGH (ref 70–99)
Potassium: 4.8 mmol/L (ref 3.5–5.1)
Sodium: 141 mmol/L (ref 135–145)
Total Bilirubin: 1 mg/dL (ref 0.3–1.2)
Total Protein: 6.9 g/dL (ref 6.5–8.1)

## 2021-03-30 LAB — MAGNESIUM: Magnesium: 2 mg/dL (ref 1.7–2.4)

## 2021-03-30 NOTE — Progress Notes (Signed)
°  [place] OFFICE PROGRESS NOTE   Diagnosis: CML  INTERVAL HISTORY:   Ms. Hector returns as scheduled.  She continues Nilotinib.  She denies nausea/vomiting.  No mouth sores.  She has an occasional loose stool.  No rash.  She denies abdominal pain.  She has a good appetite.  Objective:  Vital signs in last 24 hours:  Blood pressure (!) 161/73, pulse 63, temperature 97.7 F (36.5 C), temperature source Oral, resp. rate 18, height 5\' 3"  (1.6 m), weight 123 lb 9.6 oz (56.1 kg), SpO2 98 %.    HEENT: No thrush or ulcers. Resp: Lungs clear bilaterally. Cardio: Regular rate and rhythm. GI: Abdomen soft and nontender.  No hepatosplenomegaly. Vascular: No leg edema. Skin: No rash.   Lab Results:  Lab Results  Component Value Date   WBC 6.9 03/30/2021   HGB 13.9 03/30/2021   HCT 40.6 03/30/2021   MCV 89.6 03/30/2021   PLT 255 03/30/2021   NEUTROABS 4.8 03/30/2021    Imaging:  No results found.  Medications: I have reviewed the patient's current medications.  Assessment/Plan: Chronic myelogenous leukemia -- initially treated with hydroxyurea. She began [place] on 08/27/2009. Gleevec was placed on hold on 10/06/2009 due to poor tolerance of the 400 mg daily dose. Gleevec was restarted when she had hematologic improvement on 10/13/2009 and the dose was changed to 300 mg on 10/26/2009. The Gleevec dose was reduced to 200 mg daily due to excessive "tearing". The peripheral blood BCR/ABL returned at 16% on 04/19/2010. Gleevec was discontinued secondary to toxicity and a persistent elevation of the peripheral blood translocation . She began Nilotinib on 06/09/2011. Nilotinib placed on hold beginning 08/04/2011 due to fatigue/dyspnea on exertion and slight QT prolongation. Nilotinib was resumed on 08/15/2011 at a dose of 300 mg daily. Peripheral blood PCR was negative on 12/27/2011 and 05/14/2012. Mild elevation of the peripheral blood PCR on 10/02/2012. Improved on repeat  peripheral blood PCR 01/01/2013. Stable 11/25/2014. Improved 05/26/2015. Stable 08/25/2015. Improved 11/24/2015. Patient self reduced dose of Nilotinib from 300 mg daily to 150 mg daily in December 2018 Nilotinib 200 mg daily June 2019 Peripheral blood PCR higher on 09/12/2017 Nilotinib increased to 300 mg daily beginning 09/27/2017 Peripheral blood PCR improved 12/19/2017, stable on 03/20/2018, stable 07/24/2018, stable 11/20/2018, stable 03/27/2019, 07/26/2019, 11/20/2019, 03/17/2020, 07/21/2020, improved 11/24/2020 Diarrhea. Intermittent, mild. Periorbital edema -improved since stopping Gleevec. Increased "tearing"-improved with discontinuance of Gleevec.   History of a fine papular rash over the trunk and legs-likely secondary to the nilotinib.   Mild QT prolongation and multifocal atrial pacemaker on EKG 08/04/2011. QT normal on EKG 08/26/2014. QTc normal 05/26/2015,08/25/2015,  02/23/2016 , 06/13/2017, 11/19/2019 History of mild hypokalemia -maintained on oral potassium replacement. Potassium in normal range on labs today. Hypertension    Disposition: [name] appears stable.  The peripheral blood PCR from 4 months ago was improved.  She will continue Nilotinib at the current dose of 300 mg daily.  CBC and chemistry panel reviewed.  Labs adequate to continue Nilotinib as above.  She will return for lab and follow-up in 4 months.    [name] ANP/GNP-BC   03/30/2021  10:59 AM

## 2021-04-06 LAB — BCR/ABL

## 2021-06-16 ENCOUNTER — Other Ambulatory Visit: Payer: Self-pay | Admitting: *Deleted

## 2021-06-16 DIAGNOSIS — C9210 Chronic myeloid leukemia, BCR/ABL-positive, not having achieved remission: Secondary | ICD-10-CM

## 2021-06-16 MED ORDER — NILOTINIB HCL 150 MG PO CAPS: ORAL_CAPSULE | ORAL | 1 refills | Status: DC

## 2021-06-16 NOTE — Telephone Encounter (Signed)
Received faxed refill request from [facility] by [employer] for Tasigna. Refills approved. ?

## 2021-07-05 ENCOUNTER — Other Ambulatory Visit: Payer: Self-pay | Admitting: Oncology

## 2021-07-08 ENCOUNTER — Other Ambulatory Visit: Payer: Self-pay | Admitting: Family Medicine

## 2021-07-08 DIAGNOSIS — Z0000 Encounter for general adult medical examination without abnormal findings: Secondary | ICD-10-CM | POA: Diagnosis not present

## 2021-07-08 DIAGNOSIS — I129 Hypertensive chronic kidney disease with stage 1 through stage 4 chronic kidney disease, or unspecified chronic kidney disease: Secondary | ICD-10-CM | POA: Diagnosis not present

## 2021-07-08 DIAGNOSIS — E1122 Type 2 diabetes mellitus with diabetic chronic kidney disease: Secondary | ICD-10-CM | POA: Diagnosis not present

## 2021-07-08 DIAGNOSIS — J309 Allergic rhinitis, unspecified: Secondary | ICD-10-CM | POA: Diagnosis not present

## 2021-07-08 DIAGNOSIS — N1831 Chronic kidney disease, stage 3a: Secondary | ICD-10-CM | POA: Diagnosis not present

## 2021-07-08 DIAGNOSIS — E782 Mixed hyperlipidemia: Secondary | ICD-10-CM | POA: Diagnosis not present

## 2021-07-08 DIAGNOSIS — M810 Age-related osteoporosis without current pathological fracture: Secondary | ICD-10-CM | POA: Diagnosis not present

## 2021-07-08 DIAGNOSIS — C9210 Chronic myeloid leukemia, BCR/ABL-positive, not having achieved remission: Secondary | ICD-10-CM | POA: Diagnosis not present

## 2021-07-08 DIAGNOSIS — F3342 Major depressive disorder, recurrent, in full remission: Secondary | ICD-10-CM | POA: Diagnosis not present

## 2021-07-12 ENCOUNTER — Telehealth: Payer: Self-pay | Admitting: *Deleted

## 2021-07-12 NOTE — Telephone Encounter (Signed)
[name] has called to report she needs a new script for Tasigna (nilotinib). According to records, she should still have a current refill. Called [facility] to follow up: They have her refill and plan to ship on 6/6 for 6/8 delivery and will reach out to patient prior.  Spoke with [name] and she thought the script was sent to [facility]. She has not heard from [facility] regarding shipping. I provided her the phone # to pharmacy to call to arrange for delivery.

## 2021-07-15 ENCOUNTER — Other Ambulatory Visit: Payer: Self-pay | Admitting: *Deleted

## 2021-07-15 DIAGNOSIS — C9210 Chronic myeloid leukemia, BCR/ABL-positive, not having achieved remission: Secondary | ICD-10-CM

## 2021-07-15 MED ORDER — NILOTINIB HCL 150 MG PO CAPS: ORAL_CAPSULE | ORAL | 2 refills | Status: DC

## 2021-07-15 NOTE — Progress Notes (Signed)
Refill sent to [facility] for Tasigna.

## 2021-07-27 ENCOUNTER — Inpatient Hospital Stay: Payer: Medicare HMO | Attending: Oncology

## 2021-07-27 ENCOUNTER — Other Ambulatory Visit: Payer: Self-pay

## 2021-07-27 ENCOUNTER — Inpatient Hospital Stay: Payer: Medicare HMO | Admitting: Oncology

## 2021-07-27 VITALS — BP 160/64 | HR 60 | Temp 98.1°F | Resp 19 | Ht 63.0 in | Wt 122.4 lb

## 2021-07-27 DIAGNOSIS — C9210 Chronic myeloid leukemia, BCR/ABL-positive, not having achieved remission: Secondary | ICD-10-CM

## 2021-07-27 DIAGNOSIS — C9211 Chronic myeloid leukemia, BCR/ABL-positive, in remission: Secondary | ICD-10-CM | POA: Insufficient documentation

## 2021-07-27 DIAGNOSIS — I10 Essential (primary) hypertension: Secondary | ICD-10-CM | POA: Insufficient documentation

## 2021-07-27 LAB — CBC WITH DIFFERENTIAL (CANCER CENTER ONLY)
Abs Immature Granulocytes: 0.02 10*3/uL (ref 0.00–0.07)
Basophils Absolute: 0.1 10*3/uL (ref 0.0–0.1)
Basophils Relative: 1 %
Eosinophils Absolute: 0.4 10*3/uL (ref 0.0–0.5)
Eosinophils Relative: 6 %
HCT: 39.3 % (ref 36.0–46.0)
Hemoglobin: 13.3 g/dL (ref 12.0–15.0)
Immature Granulocytes: 0 %
Lymphocytes Relative: 25 %
Lymphs Abs: 1.4 10*3/uL (ref 0.7–4.0)
MCH: 30 pg (ref 26.0–34.0)
MCHC: 33.8 g/dL (ref 30.0–36.0)
MCV: 88.5 fL (ref 80.0–100.0)
Monocytes Absolute: 0.5 10*3/uL (ref 0.1–1.0)
Monocytes Relative: 9 %
Neutro Abs: 3.4 10*3/uL (ref 1.7–7.7)
Neutrophils Relative %: 59 %
Platelet Count: 230 10*3/uL (ref 150–400)
RBC: 4.44 MIL/uL (ref 3.87–5.11)
RDW: 12.7 % (ref 11.5–15.5)
WBC Count: 5.8 10*3/uL (ref 4.0–10.5)
nRBC: 0 % (ref 0.0–0.2)

## 2021-07-27 LAB — CMP (CANCER CENTER ONLY)
ALT: 13 U/L (ref 0–44)
AST: 14 U/L — ABNORMAL LOW (ref 15–41)
Albumin: 4.4 g/dL (ref 3.5–5.0)
Alkaline Phosphatase: 26 U/L — ABNORMAL LOW (ref 38–126)
Anion gap: 10 (ref 5–15)
BUN: 21 mg/dL (ref 8–23)
CO2: 26 mmol/L (ref 22–32)
Calcium: 9.9 mg/dL (ref 8.9–10.3)
Chloride: 104 mmol/L (ref 98–111)
Creatinine: 1.35 mg/dL — ABNORMAL HIGH (ref 0.44–1.00)
GFR, Estimated: 38 mL/min — ABNORMAL LOW (ref >60)
Glucose, Bld: 107 mg/dL — ABNORMAL HIGH (ref 70–99)
Potassium: 4.5 mmol/L (ref 3.5–5.1)
Sodium: 140 mmol/L (ref 135–145)
Total Bilirubin: 1 mg/dL (ref 0.3–1.2)
Total Protein: 7.2 g/dL (ref 6.5–8.1)

## 2021-07-27 LAB — MAGNESIUM: Magnesium: 2.1 mg/dL (ref 1.7–2.4)

## 2021-07-27 NOTE — Progress Notes (Signed)
[place] OFFICE PROGRESS NOTE   Diagnosis: CML  INTERVAL HISTORY:   [name] returns as scheduled.  She continues nilotinib.  She has eye swelling and discomfort.  This is worse when she is outside in the "pollen".  Eyedrops help partially.  No rash or diarrhea.  No other complaint.  Objective:  Vital signs in last 24 hours:  Blood pressure (!) 160/64, pulse 60, temperature 98.1 F (36.7 C), temperature source Oral, resp. rate 19, height '5\' 3"'$  (1.6 m), weight 122 lb 6.4 oz (55.5 kg), SpO2 100 %.    HEENT: Mild periorbital edema, mild conjunctival erythema, no thrush or ulcers Resp: Lungs clear bilaterally Cardio: Regular rate and rhythm GI: No hepatosplenomegaly Vascular: No leg edema  Skin: No rash   Lab Results:  Lab Results  Component Value Date   WBC 5.8 07/27/2021   HGB 13.3 07/27/2021   HCT 39.3 07/27/2021   MCV 88.5 07/27/2021   PLT 230 07/27/2021   NEUTROABS 3.4 07/27/2021    CMP  Lab Results  Component Value Date   NA 140 07/27/2021   K 4.5 07/27/2021   CL 104 07/27/2021   CO2 26 07/27/2021   GLUCOSE 107 (H) 07/27/2021   BUN 21 07/27/2021   CREATININE 1.35 (H) 07/27/2021   CALCIUM 9.9 07/27/2021   PROT 7.2 07/27/2021   ALBUMIN 4.4 07/27/2021   AST 14 (L) 07/27/2021   ALT 13 07/27/2021   ALKPHOS 26 (L) 07/27/2021   BILITOT 1.0 07/27/2021   GFRNONAA 38 (L) 07/27/2021   GFRAA 48 (L) 07/23/2019    Medications: I have reviewed the patient's current medications.   Assessment/Plan:  Chronic myelogenous leukemia -- initially treated with hydroxyurea. She began [place] on 08/27/2009. Gleevec was placed on hold on 10/06/2009 due to poor tolerance of the 400 mg daily dose. Gleevec was restarted when she had hematologic improvement on 10/13/2009 and the dose was changed to 300 mg on 10/26/2009. The Gleevec dose was reduced to 200 mg daily due to excessive "tearing". The peripheral blood BCR/ABL returned at 16% on 04/19/2010. Gleevec  was discontinued secondary to toxicity and a persistent elevation of the peripheral blood translocation . She began Nilotinib on 06/09/2011. Nilotinib placed on hold beginning 08/04/2011 due to fatigue/dyspnea on exertion and slight QT prolongation. Nilotinib was resumed on 08/15/2011 at a dose of 300 mg daily. Peripheral blood PCR was negative on 12/27/2011 and 05/14/2012. Mild elevation of the peripheral blood PCR on 10/02/2012. Improved on repeat peripheral blood PCR 01/01/2013. Stable 11/25/2014. Improved 05/26/2015. Stable 08/25/2015. Improved 11/24/2015. Patient self reduced dose of Nilotinib from 300 mg daily to 150 mg daily in December 2018 Nilotinib 200 mg daily June 2019 Peripheral blood PCR higher on 09/12/2017 Nilotinib increased to 300 mg daily beginning 09/27/2017 Peripheral blood PCR improved 12/19/2017, stable on 03/20/2018, stable 07/24/2018, stable 11/20/2018, stable 03/27/2019, 07/26/2019, 11/20/2019, 03/17/2020, 07/21/2020, improved 11/24/2020, Diarrhea. Intermittent, mild. Periorbital edema -improved since stopping Gleevec. Increased "tearing"-improved with discontinuance of Gleevec.   History of a fine papular rash over the trunk and legs-likely secondary to the nilotinib.   Mild QT prolongation and multifocal atrial pacemaker on EKG 08/04/2011. QT normal on EKG 08/26/2014. QTc normal 05/26/2015,08/25/2015,  02/23/2016 , 06/13/2017, 11/19/2019 History of mild hypokalemia -maintained on oral potassium replacement. Potassium in normal range on labs today. Hypertension     Disposition: [name] appears unchanged.  She remains in hematologic remission from CML.  She will continue nilotinib.  We will follow-up on the peripheral blood  PCR from today.  The PCR was slightly higher in February.  The creatinine is mildly elevated today.  She will return for an office visit and EKG in 4 months.    Betsy Coder, MD  07/27/2021  10:45 AM

## 2021-08-02 LAB — BCR/ABL

## 2021-08-05 ENCOUNTER — Telehealth: Payer: Self-pay

## 2021-08-05 NOTE — Telephone Encounter (Signed)
Pt verbalized understanding.

## 2021-08-05 NOTE — Telephone Encounter (Signed)
-----   Message from Ladell Pier, MD sent at 08/04/2021  9:05 PM EDT ----- Please call patient peripheral blood PCR is higher, continue nilotinib, repeat peripheral blood BCR: ABL PCR next visit, if higher again we will consider changing treatment

## 2021-10-07 ENCOUNTER — Other Ambulatory Visit (HOSPITAL_COMMUNITY): Payer: Self-pay

## 2021-10-29 ENCOUNTER — Other Ambulatory Visit: Payer: Self-pay

## 2021-10-29 DIAGNOSIS — C9210 Chronic myeloid leukemia, BCR/ABL-positive, not having achieved remission: Secondary | ICD-10-CM

## 2021-10-29 MED ORDER — NILOTINIB HCL 150 MG PO CAPS: ORAL_CAPSULE | ORAL | 2 refills | Status: DC

## 2021-11-23 ENCOUNTER — Encounter: Payer: Self-pay | Admitting: Nurse Practitioner

## 2021-11-23 ENCOUNTER — Other Ambulatory Visit: Payer: Self-pay

## 2021-11-23 ENCOUNTER — Inpatient Hospital Stay: Payer: Medicare HMO | Admitting: Nurse Practitioner

## 2021-11-23 ENCOUNTER — Inpatient Hospital Stay: Payer: Medicare HMO

## 2021-11-23 ENCOUNTER — Inpatient Hospital Stay: Payer: Medicare HMO | Attending: Oncology

## 2021-11-23 VITALS — BP 162/67 | HR 87 | Temp 98.1°F | Resp 18 | Ht 63.0 in | Wt 121.6 lb

## 2021-11-23 DIAGNOSIS — I10 Essential (primary) hypertension: Secondary | ICD-10-CM | POA: Insufficient documentation

## 2021-11-23 DIAGNOSIS — C9210 Chronic myeloid leukemia, BCR/ABL-positive, not having achieved remission: Secondary | ICD-10-CM | POA: Insufficient documentation

## 2021-11-23 DIAGNOSIS — Z79899 Other long term (current) drug therapy: Secondary | ICD-10-CM | POA: Insufficient documentation

## 2021-11-23 DIAGNOSIS — Z23 Encounter for immunization: Secondary | ICD-10-CM

## 2021-11-23 LAB — CBC WITH DIFFERENTIAL (CANCER CENTER ONLY)
Abs Immature Granulocytes: 0.03 10*3/uL (ref 0.00–0.07)
Basophils Absolute: 0.1 10*3/uL (ref 0.0–0.1)
Basophils Relative: 1 %
Eosinophils Absolute: 0.2 10*3/uL (ref 0.0–0.5)
Eosinophils Relative: 3 %
HCT: 43.7 % (ref 36.0–46.0)
Hemoglobin: 15.1 g/dL — ABNORMAL HIGH (ref 12.0–15.0)
Immature Granulocytes: 0 %
Lymphocytes Relative: 22 %
Lymphs Abs: 1.6 10*3/uL (ref 0.7–4.0)
MCH: 30 pg (ref 26.0–34.0)
MCHC: 34.6 g/dL (ref 30.0–36.0)
MCV: 86.7 fL (ref 80.0–100.0)
Monocytes Absolute: 0.5 10*3/uL (ref 0.1–1.0)
Monocytes Relative: 7 %
Neutro Abs: 5.1 10*3/uL (ref 1.7–7.7)
Neutrophils Relative %: 67 %
Platelet Count: 264 10*3/uL (ref 150–400)
RBC: 5.04 MIL/uL (ref 3.87–5.11)
RDW: 12.9 % (ref 11.5–15.5)
WBC Count: 7.6 10*3/uL (ref 4.0–10.5)
nRBC: 0 % (ref 0.0–0.2)

## 2021-11-23 LAB — CMP (CANCER CENTER ONLY)
ALT: 13 U/L (ref 0–44)
AST: 15 U/L (ref 15–41)
Albumin: 4.8 g/dL (ref 3.5–5.0)
Alkaline Phosphatase: 38 U/L (ref 38–126)
Anion gap: 11 (ref 5–15)
BUN: 23 mg/dL (ref 8–23)
CO2: 26 mmol/L (ref 22–32)
Calcium: 10.2 mg/dL (ref 8.9–10.3)
Chloride: 102 mmol/L (ref 98–111)
Creatinine: 1.27 mg/dL — ABNORMAL HIGH (ref 0.44–1.00)
GFR, Estimated: 41 mL/min — ABNORMAL LOW (ref >60)
Glucose, Bld: 127 mg/dL — ABNORMAL HIGH (ref 70–99)
Potassium: 4.3 mmol/L (ref 3.5–5.1)
Sodium: 139 mmol/L (ref 135–145)
Total Bilirubin: 1 mg/dL (ref 0.3–1.2)
Total Protein: 7.6 g/dL (ref 6.5–8.1)

## 2021-11-23 MED ORDER — INFLUENZA VAC A&B SA ADJ QUAD 0.5 ML IM PRSY
0.5000 mL | PREFILLED_SYRINGE | Freq: Once | INTRAMUSCULAR | Status: AC
  Administered 2021-11-23: 0.5 mL via INTRAMUSCULAR
  Filled 2021-11-23: qty 0.5

## 2021-11-23 MED ORDER — INFLUENZA VAC A&B SA ADJ QUAD 0.5 ML IM PRSY: 0.5000 mL | PREFILLED_SYRINGE | Freq: Once | INTRAMUSCULAR | Status: DC

## 2021-11-23 NOTE — Addendum Note (Signed)
Addended by: Roselind Messier A on: 11/23/2021 11:34 AM   Modules accepted: Orders

## 2021-11-23 NOTE — Progress Notes (Signed)
[place] OFFICE PROGRESS NOTE   Diagnosis: CML  INTERVAL HISTORY:   [name] returns as scheduled.  She continues Nilotinib.  Peripheral blood PCR was higher 07/27/2021.  In general she feels well.  She has a "stress headache" at present due to her office visit today.  She missed several doses of Nilotinib last week due to her medication not being shipped in time.  Otherwise she reports good compliance.  She denies nausea/vomiting.  No mouth sores.  No significant diarrhea.  No rash.  Objective:  Vital signs in last 24 hours:  Blood pressure (!) 162/67, pulse 87, temperature 98.1 F (36.7 C), temperature source Oral, resp. rate 18, height '5\' 3"'$  (1.6 m), weight 121 lb 9.6 oz (55.2 kg), SpO2 100 %.    HEENT: No thrush or ulcers. Resp: Lungs clear bilaterally. Cardio: Regular rate and rhythm. GI: No hepatosplenomegaly. Vascular: No leg edema. Skin: No rash.   Lab Results:  Lab Results  Component Value Date   WBC 7.6 11/23/2021   HGB 15.1 (H) 11/23/2021   HCT 43.7 11/23/2021   MCV 86.7 11/23/2021   PLT 264 11/23/2021   NEUTROABS 5.1 11/23/2021    Imaging:  No results found.  Medications: I have reviewed the patient's current medications.  Assessment/Plan: Chronic myelogenous leukemia -- initially treated with hydroxyurea. She began [place] on 08/27/2009. Gleevec was placed on hold on 10/06/2009 due to poor tolerance of the 400 mg daily dose. Gleevec was restarted when she had hematologic improvement on 10/13/2009 and the dose was changed to 300 mg on 10/26/2009. The Gleevec dose was reduced to 200 mg daily due to excessive "tearing". The peripheral blood BCR/ABL returned at 16% on 04/19/2010. Gleevec was discontinued secondary to toxicity and a persistent elevation of the peripheral blood translocation . She began Nilotinib on 06/09/2011. Nilotinib placed on hold beginning 08/04/2011 due to fatigue/dyspnea on exertion and slight QT prolongation. Nilotinib  was resumed on 08/15/2011 at a dose of 300 mg daily. Peripheral blood PCR was negative on 12/27/2011 and 05/14/2012. Mild elevation of the peripheral blood PCR on 10/02/2012. Improved on repeat peripheral blood PCR 01/01/2013. Stable 11/25/2014. Improved 05/26/2015. Stable 08/25/2015. Improved 11/24/2015. Patient self reduced dose of Nilotinib from 300 mg daily to 150 mg daily in December 2018 Nilotinib 200 mg daily June 2019 Peripheral blood PCR higher on 09/12/2017 Nilotinib increased to 300 mg daily beginning 09/27/2017 Peripheral blood PCR improved 12/19/2017, stable on 03/20/2018, stable 07/24/2018, stable 11/20/2018, stable 03/27/2019, 07/26/2019, 11/20/2019, 03/17/2020, 07/21/2020, improved 11/24/2020, slightly higher 03/31/2021, higher 07/27/2021 Diarrhea. Intermittent, mild. Periorbital edema -improved since stopping Gleevec. Increased "tearing"-improved with discontinuance of Gleevec.   History of a fine papular rash over the trunk and legs-likely secondary to the nilotinib.   Mild QT prolongation and multifocal atrial pacemaker on EKG 08/04/2011. QT normal on EKG 08/26/2014. QTc normal 05/26/2015,08/25/2015,  02/23/2016 , 06/13/2017, 11/19/2019 History of mild hypokalemia -maintained on oral potassium replacement. Potassium in normal range on labs today. Hypertension    Disposition: Ms. Detert appears stable.  She is currently taking Nilotinib 300 mg daily.  Peripheral blood PCR has been higher the last 2 times it was checked.  We will follow-up on the value from today.  If higher again consideration will be given to changing treatment.  CBC and chemistry panel reviewed.  Labs adequate to continue Nilotinib as above.  EKG obtained today, QTc in adequate range.  She will return for lab, EKG and follow-up in 4 months.  We will see  her sooner if the peripheral blood PCR is higher.    [name] ANP/GNP-BC   11/23/2021  11:07 AM

## 2021-11-23 NOTE — Patient Instructions (Signed)
Influenza Virus Vaccine injection What is this medication? INFLUENZA VIRUS VACCINE (in floo EN zuh VAHY ruhs vak SEEN) helps to reduce the risk of getting influenza also known as the flu. The vaccine only helps protect you against some strains of the flu. This medicine may be used for other purposes; ask your health care provider or pharmacist if you have questions. COMMON BRAND NAME(S): Afluria, Afluria Quadrivalent, Agriflu, Alfuria, FLUAD, FLUAD Quadrivalent, Fluarix, Fluarix Quadrivalent, Flublok, Flublok Quadrivalent, FLUCELVAX, FLUCELVAX Quadrivalent, Flulaval, Flulaval Quadrivalent, Fluvirin, Fluzone, Fluzone High-Dose, Fluzone Intradermal, Fluzone Quadrivalent What should I tell my care team before I take this medication? They need to know if you have any of these conditions: bleeding disorder like hemophilia fever or infection Guillain-Barre syndrome or other neurological problems immune system problems infection with the human immunodeficiency virus (HIV) or AIDS low blood platelet counts multiple sclerosis an unusual or allergic reaction to influenza virus vaccine, latex, other medicines, foods, dyes, or preservatives. Different brands of vaccines contain different allergens. Some may contain latex or eggs. Talk to your doctor about your allergies to make sure that you get the right vaccine. pregnant or trying to get pregnant breast-feeding How should I use this medication? This vaccine is for injection into a muscle or under the skin. It is given by a health care professional. A copy of Vaccine Information Statements will be given before each vaccination. Read this sheet carefully each time. The sheet may change frequently. Talk to your healthcare provider to see which vaccines are right for you. Some vaccines should not be used in all age groups. Overdosage: If you think you have taken too much of this medicine contact a poison control center or emergency room at once. NOTE: This  medicine is only for you. Do not share this medicine with others. What if I miss a dose? This does not apply. What may interact with this medication? chemotherapy or radiation therapy medicines that lower your immune system like etanercept, anakinra, infliximab, and adalimumab medicines that treat or prevent blood clots like warfarin phenytoin steroid medicines like prednisone or cortisone theophylline vaccines This list may not describe all possible interactions. Give your health care provider a list of all the medicines, herbs, non-prescription drugs, or dietary supplements you use. Also tell them if you smoke, drink alcohol, or use illegal drugs. Some items may interact with your medicine. What should I watch for while using this medication? Report any side effects that do not go away within 3 days to your doctor or health care professional. Call your health care provider if any unusual symptoms occur within 6 weeks of receiving this vaccine. You may still catch the flu, but the illness is not usually as bad. You cannot get the flu from the vaccine. The vaccine will not protect against colds or other illnesses that may cause fever. The vaccine is needed every year. What side effects may I notice from receiving this medication? Side effects that you should report to your doctor or health care professional as soon as possible: allergic reactions like skin rash, itching or hives, swelling of the face, lips, or tongue Side effects that usually do not require medical attention (report to your doctor or health care professional if they continue or are bothersome): fever headache muscle aches and pains pain, tenderness, redness, or swelling at the injection site tiredness This list may not describe all possible side effects. Call your doctor for medical advice about side effects. You may report side effects to FDA at 1-800-FDA-1088.   Where should I keep my medication? The vaccine will be given  by a health care professional in a clinic, pharmacy, doctor's office, or other health care setting. You will not be given vaccine doses to store at home. NOTE: This sheet is a summary. It may not cover all possible information. If you have questions about this medicine, talk to your doctor, pharmacist, or health care provider.  2023 Elsevier/Gold Standard (2020-08-28 00:00:00)  

## 2021-11-29 LAB — BCR/ABL

## 2021-11-30 ENCOUNTER — Telehealth: Payer: Self-pay

## 2021-11-30 NOTE — Telephone Encounter (Signed)
-----   Message from Ladell Pier, MD sent at 11/29/2021  7:08 PM EDT ----- Please call patient, pcr is better, continue nilotinib, f/u as scheduled

## 2021-11-30 NOTE — Telephone Encounter (Signed)
Patient gave verbal understanding had no further questions or concerns. 

## 2021-12-03 ENCOUNTER — Other Ambulatory Visit: Payer: Self-pay | Admitting: *Deleted

## 2021-12-03 DIAGNOSIS — C9210 Chronic myeloid leukemia, BCR/ABL-positive, not having achieved remission: Secondary | ICD-10-CM

## 2021-12-15 ENCOUNTER — Other Ambulatory Visit (HOSPITAL_COMMUNITY): Payer: Self-pay

## 2021-12-16 ENCOUNTER — Telehealth: Payer: Self-pay

## 2021-12-16 NOTE — Telephone Encounter (Signed)
Oral Oncology Patient Advocate Encounter   Received notification that patient is due for re-enrollment for assistance for Tasigna through [employer].   Re-enrollment process has been initiated and will be submitted upon completion of necessary documents.  [employer]' phone number [phone].   I will continue to follow until final determination.  Berdine Addison, [facility] Patient [place]  [phone] (phone) [phone] (fax) 12/16/2021 3:13 PM

## 2021-12-22 NOTE — Telephone Encounter (Signed)
Received patient signatures and proof of income. Will submit for processing once I have received MD signature.   Berdine Addison, [facility] Patient [place]  [phone] (phone) [phone] (fax) 12/22/2021 2:34 PM

## 2021-12-24 NOTE — Telephone Encounter (Signed)
Oral Oncology Patient Advocate Encounter   Submitted application for assistance for Tasigna to [employer].   Application submitted via e-fax to [facility]' phone number [phone].   I will continue to check the status until final determination.   Berdine Addison, [facility] Patient [place]  [phone] (phone) [phone] (fax) 12/24/2021 8:23 AM

## 2022-01-03 NOTE — Telephone Encounter (Signed)
Oral Oncology Patient Advocate Encounter   Received notification that the application for assistance for [place] through [employer] has been approved.   [employer]' phone number [phone].   Effective dates: 02/07/22 through 02/07/23  I have spoken to the patient.  Berdine Addison, [facility] Patient [place]  [phone] (phone) [phone] (fax) 01/03/2022 7:58 AM

## 2022-01-24 ENCOUNTER — Other Ambulatory Visit: Payer: Self-pay | Admitting: *Deleted

## 2022-01-24 DIAGNOSIS — C9210 Chronic myeloid leukemia, BCR/ABL-positive, not having achieved remission: Secondary | ICD-10-CM

## 2022-01-24 MED ORDER — NILOTINIB HCL 150 MG PO CAPS: ORAL_CAPSULE | ORAL | 2 refills | Status: DC

## 2022-01-24 NOTE — Telephone Encounter (Signed)
VM from patient requesting refill on her nilotinib. Refill sent to [facility].

## 2022-02-16 DIAGNOSIS — M25511 Pain in right shoulder: Secondary | ICD-10-CM | POA: Diagnosis not present

## 2022-02-16 DIAGNOSIS — M25512 Pain in left shoulder: Secondary | ICD-10-CM | POA: Diagnosis not present

## 2022-02-16 DIAGNOSIS — M778 Other enthesopathies, not elsewhere classified: Secondary | ICD-10-CM | POA: Diagnosis not present

## 2022-02-22 ENCOUNTER — Ambulatory Visit
Admission: RE | Admit: 2022-02-22 | Discharge: 2022-02-22 | Disposition: A | Discharge status: Home / Self Care | Payer: Medicare HMO | Source: Ambulatory Visit | Attending: Family Medicine | Admitting: Family Medicine

## 2022-02-22 DIAGNOSIS — M8589 Other specified disorders of bone density and structure, multiple sites: Secondary | ICD-10-CM | POA: Diagnosis not present

## 2022-02-22 DIAGNOSIS — Z780 Asymptomatic menopausal state: Secondary | ICD-10-CM | POA: Diagnosis not present

## 2022-02-22 DIAGNOSIS — M810 Age-related osteoporosis without current pathological fracture: Secondary | ICD-10-CM

## 2022-02-23 ENCOUNTER — Other Ambulatory Visit: Payer: Self-pay | Admitting: Oncology

## 2022-03-02 ENCOUNTER — Ambulatory Visit
Admission: RE | Admit: 2022-03-02 | Discharge: 2022-03-02 | Disposition: A | Discharge status: Home / Self Care | Payer: Medicare HMO | Source: Ambulatory Visit | Attending: Sports Medicine | Admitting: Sports Medicine

## 2022-03-02 ENCOUNTER — Other Ambulatory Visit: Payer: Self-pay | Admitting: Sports Medicine

## 2022-03-02 DIAGNOSIS — M25511 Pain in right shoulder: Secondary | ICD-10-CM

## 2022-03-02 DIAGNOSIS — M25512 Pain in left shoulder: Secondary | ICD-10-CM | POA: Diagnosis not present

## 2022-03-23 DIAGNOSIS — M791 Myalgia, unspecified site: Secondary | ICD-10-CM | POA: Diagnosis not present

## 2022-03-23 DIAGNOSIS — M25512 Pain in left shoulder: Secondary | ICD-10-CM | POA: Diagnosis not present

## 2022-03-29 ENCOUNTER — Inpatient Hospital Stay: Payer: Medicare HMO | Attending: Oncology

## 2022-03-29 ENCOUNTER — Inpatient Hospital Stay: Payer: Medicare HMO | Admitting: Oncology

## 2022-03-29 VITALS — BP 153/76 | HR 83 | Temp 98.1°F | Resp 18 | Ht 63.0 in | Wt 115.2 lb

## 2022-03-29 DIAGNOSIS — Z79899 Other long term (current) drug therapy: Secondary | ICD-10-CM | POA: Insufficient documentation

## 2022-03-29 DIAGNOSIS — C9210 Chronic myeloid leukemia, BCR/ABL-positive, not having achieved remission: Secondary | ICD-10-CM

## 2022-03-29 DIAGNOSIS — R110 Nausea: Secondary | ICD-10-CM | POA: Insufficient documentation

## 2022-03-29 DIAGNOSIS — C9211 Chronic myeloid leukemia, BCR/ABL-positive, in remission: Secondary | ICD-10-CM | POA: Insufficient documentation

## 2022-03-29 LAB — CBC WITH DIFFERENTIAL (CANCER CENTER ONLY)
Abs Immature Granulocytes: 0.14 10*3/uL — ABNORMAL HIGH (ref 0.00–0.07)
Basophils Absolute: 0.1 10*3/uL (ref 0.0–0.1)
Basophils Relative: 1 %
Eosinophils Absolute: 0.3 10*3/uL (ref 0.0–0.5)
Eosinophils Relative: 4 %
HCT: 36.7 % (ref 36.0–46.0)
Hemoglobin: 12.3 g/dL (ref 12.0–15.0)
Immature Granulocytes: 2 %
Lymphocytes Relative: 12 %
Lymphs Abs: 1 10*3/uL (ref 0.7–4.0)
MCH: 29.5 pg (ref 26.0–34.0)
MCHC: 33.5 g/dL (ref 30.0–36.0)
MCV: 88 fL (ref 80.0–100.0)
Monocytes Absolute: 0.7 10*3/uL (ref 0.1–1.0)
Monocytes Relative: 8 %
Neutro Abs: 6.2 10*3/uL (ref 1.7–7.7)
Neutrophils Relative %: 73 %
Platelet Count: 314 10*3/uL (ref 150–400)
RBC: 4.17 MIL/uL (ref 3.87–5.11)
RDW: 13.3 % (ref 11.5–15.5)
WBC Count: 8.4 10*3/uL (ref 4.0–10.5)
nRBC: 0 % (ref 0.0–0.2)

## 2022-03-29 LAB — CMP (CANCER CENTER ONLY)
ALT: 10 U/L (ref 0–44)
AST: 9 U/L — ABNORMAL LOW (ref 15–41)
Albumin: 4 g/dL (ref 3.5–5.0)
Alkaline Phosphatase: 42 U/L (ref 38–126)
Anion gap: 8 (ref 5–15)
BUN: 23 mg/dL (ref 8–23)
CO2: 26 mmol/L (ref 22–32)
Calcium: 9.5 mg/dL (ref 8.9–10.3)
Chloride: 104 mmol/L (ref 98–111)
Creatinine: 1.03 mg/dL — ABNORMAL HIGH (ref 0.44–1.00)
GFR, Estimated: 53 mL/min — ABNORMAL LOW (ref >60)
Glucose, Bld: 154 mg/dL — ABNORMAL HIGH (ref 70–99)
Potassium: 3.9 mmol/L (ref 3.5–5.1)
Sodium: 138 mmol/L (ref 135–145)
Total Bilirubin: 0.8 mg/dL (ref 0.3–1.2)
Total Protein: 6.7 g/dL (ref 6.5–8.1)

## 2022-03-29 NOTE — Progress Notes (Signed)
[place] OFFICE PROGRESS NOTE   Diagnosis: CML  INTERVAL HISTORY:   [name] returns as scheduled.  She continues nilotinib.  No rash, diarrhea, or swelling.  She reports bilateral shoulder and neck pain several months ago.  She received steroid injections with improvement.  She took ibuprofen for pain.  She relates weight loss to nausea she had while taking ibuprofen.  Objective:  Vital signs in last 24 hours:  Blood pressure (!) 153/76, pulse 83, temperature 98.1 F (36.7 C), temperature source Oral, resp. rate 18, height 5' 3"$  (1.6 m), weight 115 lb 3.2 oz (52.3 kg), SpO2 98 %.    HEENT: No thrush or ulcers mild periorbital edema Resp: Lungs clear bilaterally Cardio: Regular rate and rhythm GI: No hepatosplenomegaly Vascular: No leg edema  Skin: No rash Musculoskeletal: No pain with motion of the shoulders.  Decreased grip strength at the left greater than right hand (she reports left hand soreness).   Lab Results:  Lab Results  Component Value Date   WBC 8.4 03/29/2022   HGB 12.3 03/29/2022   HCT 36.7 03/29/2022   MCV 88.0 03/29/2022   PLT 314 03/29/2022   NEUTROABS 6.2 03/29/2022    CMP  Lab Results  Component Value Date   NA 138 03/29/2022   K 3.9 03/29/2022   CL 104 03/29/2022   CO2 26 03/29/2022   GLUCOSE 154 (H) 03/29/2022   BUN 23 03/29/2022   CREATININE 1.03 (H) 03/29/2022   CALCIUM 9.5 03/29/2022   PROT 6.7 03/29/2022   ALBUMIN 4.0 03/29/2022   AST 9 (L) 03/29/2022   ALT 10 03/29/2022   ALKPHOS 42 03/29/2022   BILITOT 0.8 03/29/2022   GFRNONAA 53 (L) 03/29/2022   GFRAA 48 (L) 07/23/2019     Medications: I have reviewed the patient's current medications.   Assessment/Plan: Chronic myelogenous leukemia -- initially treated with hydroxyurea. She began [place] on 08/27/2009. Gleevec was placed on hold on 10/06/2009 due to poor tolerance of the 400 mg daily dose. Gleevec was restarted when she had hematologic improvement on  10/13/2009 and the dose was changed to 300 mg on 10/26/2009. The Gleevec dose was reduced to 200 mg daily due to excessive "tearing". The peripheral blood BCR/ABL returned at 16% on 04/19/2010. Gleevec was discontinued secondary to toxicity and a persistent elevation of the peripheral blood translocation . She began Nilotinib on 06/09/2011. Nilotinib placed on hold beginning 08/04/2011 due to fatigue/dyspnea on exertion and slight QT prolongation. Nilotinib was resumed on 08/15/2011 at a dose of 300 mg daily. Peripheral blood PCR was negative on 12/27/2011 and 05/14/2012. Mild elevation of the peripheral blood PCR on 10/02/2012. Improved on repeat peripheral blood PCR 01/01/2013. Stable 11/25/2014. Improved 05/26/2015. Stable 08/25/2015. Improved 11/24/2015. Patient self reduced dose of Nilotinib from 300 mg daily to 150 mg daily in December 2018 Nilotinib 200 mg daily June 2019 Peripheral blood PCR higher on 09/12/2017 Nilotinib increased to 300 mg daily beginning 09/27/2017 Peripheral blood PCR improved 12/19/2017, stable on 03/20/2018, stable 07/24/2018, stable 11/20/2018, stable 03/27/2019, 07/26/2019, 11/20/2019, 03/17/2020, 07/21/2020, improved 11/24/2020, slightly higher 03/31/2021, higher 07/27/2021, improved 11/23/2021 Diarrhea. Intermittent, mild. Periorbital edema -improved since stopping Gleevec. Increased "tearing"-improved with discontinuance of Gleevec.   History of a fine papular rash over the trunk and legs-likely secondary to the nilotinib.   Mild QT prolongation and multifocal atrial pacemaker on EKG 08/04/2011. QT normal on EKG 08/26/2014. QTc normal 05/26/2015,08/25/2015,  02/23/2016 , 06/13/2017, 11/19/2019 History of mild hypokalemia -maintained on oral potassium replacement.  Potassium in normal range on labs today. Hypertension     Disposition: [name] appears stable.  She will continue nilotinib at the current dose.  She will have an EKG today.  Is in hematologic remission from CML.   She is not in complete cytogenetic remission.  We will follow-up on the peripheral blood PCR from today.  She will return for an office and lab visit in 4 months.  She relates weight loss to nausea she had when taking ibuprofen for shoulder pain.  The pain has improved.  I encouraged her to try and not lose more weight.  She will follow-up with her primary provider for evaluation and management of the neck and shoulder pain.  Betsy Coder, MD  03/29/2022  11:16 AM

## 2022-04-04 LAB — BCR/ABL

## 2022-04-05 ENCOUNTER — Telehealth: Payer: Self-pay

## 2022-04-05 NOTE — Telephone Encounter (Signed)
-----   Message from Owens Shark, NP sent at 04/05/2022  2:53 PM EST ----- Please let her know the recent PCR test is stable.  Follow-up as scheduled.

## 2022-04-05 NOTE — Telephone Encounter (Signed)
Patient gave verbal understanding and had no further questions or concerns  

## 2022-04-07 DIAGNOSIS — M353 Polymyalgia rheumatica: Secondary | ICD-10-CM | POA: Diagnosis not present

## 2022-05-24 DIAGNOSIS — M353 Polymyalgia rheumatica: Secondary | ICD-10-CM | POA: Diagnosis not present

## 2022-05-24 DIAGNOSIS — M2540 Effusion, unspecified joint: Secondary | ICD-10-CM | POA: Diagnosis not present

## 2022-05-24 DIAGNOSIS — Z79899 Other long term (current) drug therapy: Secondary | ICD-10-CM | POA: Diagnosis not present

## 2022-05-24 DIAGNOSIS — Z6821 Body mass index (BMI) 21.0-21.9, adult: Secondary | ICD-10-CM | POA: Diagnosis not present

## 2022-05-24 DIAGNOSIS — M2560 Stiffness of unspecified joint, not elsewhere classified: Secondary | ICD-10-CM | POA: Diagnosis not present

## 2022-07-07 DIAGNOSIS — Z6821 Body mass index (BMI) 21.0-21.9, adult: Secondary | ICD-10-CM | POA: Diagnosis not present

## 2022-07-07 DIAGNOSIS — M2560 Stiffness of unspecified joint, not elsewhere classified: Secondary | ICD-10-CM | POA: Diagnosis not present

## 2022-07-07 DIAGNOSIS — M353 Polymyalgia rheumatica: Secondary | ICD-10-CM | POA: Diagnosis not present

## 2022-07-07 DIAGNOSIS — M2540 Effusion, unspecified joint: Secondary | ICD-10-CM | POA: Diagnosis not present

## 2022-07-07 DIAGNOSIS — Z79899 Other long term (current) drug therapy: Secondary | ICD-10-CM | POA: Diagnosis not present

## 2022-07-12 DIAGNOSIS — E782 Mixed hyperlipidemia: Secondary | ICD-10-CM | POA: Diagnosis not present

## 2022-07-12 DIAGNOSIS — I129 Hypertensive chronic kidney disease with stage 1 through stage 4 chronic kidney disease, or unspecified chronic kidney disease: Secondary | ICD-10-CM | POA: Diagnosis not present

## 2022-07-12 DIAGNOSIS — N1831 Chronic kidney disease, stage 3a: Secondary | ICD-10-CM | POA: Diagnosis not present

## 2022-07-12 DIAGNOSIS — E1122 Type 2 diabetes mellitus with diabetic chronic kidney disease: Secondary | ICD-10-CM | POA: Diagnosis not present

## 2022-07-12 DIAGNOSIS — C9211 Chronic myeloid leukemia, BCR/ABL-positive, in remission: Secondary | ICD-10-CM | POA: Diagnosis not present

## 2022-07-12 DIAGNOSIS — D8481 Immunodeficiency due to conditions classified elsewhere: Secondary | ICD-10-CM | POA: Diagnosis not present

## 2022-07-12 DIAGNOSIS — Z0000 Encounter for general adult medical examination without abnormal findings: Secondary | ICD-10-CM | POA: Diagnosis not present

## 2022-07-12 DIAGNOSIS — F3342 Major depressive disorder, recurrent, in full remission: Secondary | ICD-10-CM | POA: Diagnosis not present

## 2022-07-12 DIAGNOSIS — M353 Polymyalgia rheumatica: Secondary | ICD-10-CM | POA: Diagnosis not present

## 2022-07-17 ENCOUNTER — Ambulatory Visit (INDEPENDENT_AMBULATORY_CARE_PROVIDER_SITE_OTHER): Payer: Medicare HMO

## 2022-07-17 ENCOUNTER — Ambulatory Visit
Admission: EM | Admit: 2022-07-17 | Discharge: 2022-07-17 | Disposition: A | Discharge status: Home / Self Care | Payer: Medicare HMO | Attending: Family Medicine | Admitting: Family Medicine

## 2022-07-17 DIAGNOSIS — R0602 Shortness of breath: Secondary | ICD-10-CM | POA: Diagnosis not present

## 2022-07-17 MED ORDER — BUSPIRONE HCL 5 MG PO TABS: 5.0000 mg | ORAL_TABLET | Freq: Two times a day (BID) | ORAL | 0 refills | Status: DC | PRN

## 2022-07-17 NOTE — Discharge Instructions (Addendum)
Chest x-ray is clear.  There is no mass, there is no fluid, and there is no evidence of pneumonia.   Vital signs are reassuring here.  If you do not improve or if you worsen in any way, then please consider proceeding to the emergency room.  Buspirone 5 mg-1 tab twice daily as needed for anxiety  Take Claritin 10 mg--1 daily as needed for allergies.

## 2022-07-17 NOTE — ED Triage Notes (Signed)
Pt reports she is having trouble breathing x 2 weeks. Pt states she has to exaggerate her breathing to get a breath. Breathing difficulties has worsened since 3a m today.

## 2022-07-17 NOTE — ED Provider Notes (Addendum)
[facility]    CSN: [id number] Arrival date & time: 07/17/22  1504      History   Chief Complaint No chief complaint on file.   HPI [name] is a 89 y.o. female.   HPI Here for shortness of breath is been going on for 2 weeks but has been worse since 12 hours ago, at 3 AM this morning.  She states that she finds that she is having to frequently take a big breath to get a breath.  She states she saw her doctor a few days ago and did discuss this symptom.  She states that blood work was drawn at that time.  No fever and no cough or congestion.  No edema   She has a history of chronic myeloid leukemia.  She is just began prednisone for polymyalgia rheumatica. Past Medical History:  Diagnosis Date   Anxiety    Chronic myeloid leukemia 07/2009   Hyperlipemia    Hypertension    Osteoporosis    Pancreatitis, gallstone 2009    Patient Active Problem List   Diagnosis Date Noted   CML (chronic myelocytic leukemia) (HCC) 07/08/2009    Past Surgical History:  Procedure Laterality Date   CHOLECYSTECTOMY  12/2007   TONSILLECTOMY     age 51    OB History   No obstetric history on file.      Home Medications    Prior to Admission medications   Medication Sig Start Date End Date Taking? Authorizing Provider  busPIRone (BUSPAR) 5 MG tablet Take 1 tablet (5 mg total) by mouth every 12 (twelve) hours as needed (anxiety). 07/17/22  Yes Zenia Resides, MD  amLODipine (NORVASC) 5 MG tablet Take 5 mg by mouth daily.    [provider]  atenolol-chlorthalidone (TENORETIC) 50-25 MG per tablet Take 0.5 tablets by mouth Daily.  06/07/11   [provider]  Calcium Carbonate-Vitamin D (CALCIUM 600 + D PO) Take 1 tablet by mouth daily.    [provider]  citalopram (CELEXA) 10 MG tablet Take 10 mg by mouth daily.    [provider]  CRANBERRY EXTRACT PO Take 1 tablet by mouth daily.    [provider]  fish  oil-omega-3 fatty acids 1000 MG capsule Take 2 g by mouth daily.    [provider]  lovastatin (MEVACOR) 20 MG tablet Take 20 mg by mouth at bedtime.    [provider]  nilotinib (TASIGNA) 150 MG capsule TAKE 2 CAPSULES BY MOUTH  DAILY ON AN EMPTY STOMACH 1 HOUR BEFORE OR 2 HOURS  AFTER A MEAL 01/24/22   Ladene Artist, MD  OVER THE COUNTER MEDICATION Take 1 tablet by mouth daily. Cinnamin pill    [provider]  potassium chloride (MICRO-K) 10 MEQ CR capsule TAKE 1 CAPSULE EVERY DAY 02/23/22   Ladene Artist, MD  pyridOXINE (B-6) 50 MG tablet Take 50 mg by mouth daily.    [provider]    Family History Family History  Problem Relation Age of Onset   Breast cancer Neg Hx     Social History     Allergies   Patient has no known allergies.   Review of Systems Review of Systems   Physical Exam Triage Vital Signs ED Triage Vitals  Enc Vitals Group     BP 07/17/22 1521 (!) 154/78     Pulse Rate 07/17/22 1521 73     Resp 07/17/22 1521 12  Temp 07/17/22 1521 97.9 F (36.6 C)     Temp Source 07/17/22 1521 Oral     SpO2 07/17/22 1521 98 %     Weight --      Height --      Head Circumference --      Peak Flow --      Pain Score 07/17/22 1522 0     Pain Loc --      Pain Edu? --      Excl. in GC? --    No data found.  Updated Vital Signs BP (!) 154/78 (BP Location: Left Arm)   Pulse 73   Temp 97.9 F (36.6 C) (Oral)   Resp 12   SpO2 98%   Visual Acuity Right Eye Distance:   Left Eye Distance:   Bilateral Distance:    Right Eye Near:   Left Eye Near:    Bilateral Near:     Physical Exam Vitals reviewed.  Constitutional:      General: She is not in acute distress.    Appearance: She is not ill-appearing, toxic-appearing or diaphoretic.     Comments: When I enter the room she is in absolutely no distress.  Vital signs are reassuring  HENT:     Nose: Nose normal.     Mouth/Throat:     Mouth: Mucous membranes are  moist.  Eyes:     Extraocular Movements: Extraocular movements intact.     Conjunctiva/sclera: Conjunctivae normal.     Pupils: Pupils are equal, round, and reactive to light.  Cardiovascular:     Rate and Rhythm: Normal rate and regular rhythm.     Heart sounds: No murmur heard. Pulmonary:     Effort: Pulmonary effort is normal. No respiratory distress.     Breath sounds: Normal breath sounds. No stridor. No rhonchi or rales.  Musculoskeletal:     Cervical back: Neck supple.     Right lower leg: No edema.     Left lower leg: No edema.  Lymphadenopathy:     Cervical: No cervical adenopathy.  Skin:    Coloration: Skin is not jaundiced or pale.  Neurological:     General: No focal deficit present.     Mental Status: She is alert and oriented to person, place, and time.  Psychiatric:        Behavior: Behavior normal.      UC Treatments / Results  Labs (all labs ordered are listed, but only abnormal results are displayed) Labs Reviewed - No data to display  EKG   Radiology DG Chest 2 View  Result Date: 07/17/2022 CLINICAL DATA:  Shortness of breath EXAM: CHEST - 2 VIEW COMPARISON:  Chest radiograph 08/04/2011 FINDINGS: The cardiomediastinal silhouette is normal There is no focal consolidation or pulmonary edema. There is no pleural effusion or pneumothorax There is no acute osseous abnormality. IMPRESSION: No radiographic evidence of acute cardiopulmonary process. Electronically Signed   By: Lesia Hausen M.D.   On: 07/17/2022 16:01    Procedures Procedures (including critical care time)  Medications Ordered in UC Medications - No data to display  Initial Impression / Assessment and Plan / UC Course  I have reviewed the triage vital signs and the nursing notes.  Pertinent labs & imaging results that were available during my care of the patient were reviewed by me and considered in my medical decision making (see chart for details).    I cannot see the lab results from  her primary care that  was done recently.    Chest x-ray is clear.  Vital signs are reassuring with normal oxygen saturation on room air.  She is not tachycardic or tachypneic.  I have given her results and discussed with her that if she worsens anyway she should proceed to the emergency room.  I am also asking her to follow-up with her primary care  During the visit she stated that she was hoping I could "give me something" for the shortness of breath.  I discussed with her that I cannot figure out a therapy to target any particular problem with her having a clear chest x-ray  She really wanted me to prescribe something, so buspirone is sent in in case this is anxiety.  I discussed with her that this may or may not be the cause of her shortness of breath that is coming and going.  She will follow-up with her primary care tomorrow.  Also since she mentions pollen and allergies, Claritin is recommended for allergies Final Clinical Impressions(s) / UC Diagnoses   Final diagnoses:  Shortness of breath     Discharge Instructions      Chest x-ray is clear.  There is no mass, there is no fluid, and there is no evidence of pneumonia.   Vital signs are reassuring here.  If you do not improve or if you worsen in any way, then please consider proceeding to the emergency room.  Buspirone 5 mg-1 tab twice daily as needed for anxiety  Take Claritin 10 mg--1 daily as needed for allergies.     ED Prescriptions     Medication Sig Dispense Auth. Provider   busPIRone (BUSPAR) 5 MG tablet Take 1 tablet (5 mg total) by mouth every 12 (twelve) hours as needed (anxiety). 4 tablet Theran Vandergrift, Janace Aris, MD      PDMP not reviewed this encounter.   Zenia Resides, MD 07/17/22 1620    Zenia Resides, MD 07/17/22 1629    Zenia Resides, MD 07/17/22 1630

## 2022-07-26 ENCOUNTER — Inpatient Hospital Stay: Payer: Medicare HMO | Attending: Oncology

## 2022-07-26 ENCOUNTER — Encounter: Payer: Self-pay | Admitting: Nurse Practitioner

## 2022-07-26 ENCOUNTER — Inpatient Hospital Stay: Payer: Medicare HMO | Admitting: Nurse Practitioner

## 2022-07-26 VITALS — BP 146/68 | HR 72 | Temp 98.1°F | Resp 18 | Ht 63.0 in | Wt 123.9 lb

## 2022-07-26 DIAGNOSIS — C9211 Chronic myeloid leukemia, BCR/ABL-positive, in remission: Secondary | ICD-10-CM | POA: Insufficient documentation

## 2022-07-26 DIAGNOSIS — C9210 Chronic myeloid leukemia, BCR/ABL-positive, not having achieved remission: Secondary | ICD-10-CM | POA: Diagnosis not present

## 2022-07-26 LAB — CMP (CANCER CENTER ONLY)
ALT: 15 U/L (ref 0–44)
AST: 13 U/L — ABNORMAL LOW (ref 15–41)
Albumin: 4.2 g/dL (ref 3.5–5.0)
Alkaline Phosphatase: 32 U/L — ABNORMAL LOW (ref 38–126)
Anion gap: 8 (ref 5–15)
BUN: 20 mg/dL (ref 8–23)
CO2: 27 mmol/L (ref 22–32)
Calcium: 9.6 mg/dL (ref 8.9–10.3)
Chloride: 105 mmol/L (ref 98–111)
Creatinine: 1.12 mg/dL — ABNORMAL HIGH (ref 0.44–1.00)
GFR, Estimated: 47 mL/min — ABNORMAL LOW (ref >60)
Glucose, Bld: 102 mg/dL — ABNORMAL HIGH (ref 70–99)
Potassium: 4 mmol/L (ref 3.5–5.1)
Sodium: 140 mmol/L (ref 135–145)
Total Bilirubin: 1.1 mg/dL (ref 0.3–1.2)
Total Protein: 6.8 g/dL (ref 6.5–8.1)

## 2022-07-26 LAB — CBC WITH DIFFERENTIAL (CANCER CENTER ONLY)
Abs Immature Granulocytes: 0.18 10*3/uL — ABNORMAL HIGH (ref 0.00–0.07)
Basophils Absolute: 0.1 10*3/uL (ref 0.0–0.1)
Basophils Relative: 1 %
Eosinophils Absolute: 0.2 10*3/uL (ref 0.0–0.5)
Eosinophils Relative: 2 %
HCT: 39.7 % (ref 36.0–46.0)
Hemoglobin: 13.5 g/dL (ref 12.0–15.0)
Immature Granulocytes: 2 %
Lymphocytes Relative: 14 %
Lymphs Abs: 1.3 10*3/uL (ref 0.7–4.0)
MCH: 30.9 pg (ref 26.0–34.0)
MCHC: 34 g/dL (ref 30.0–36.0)
MCV: 90.8 fL (ref 80.0–100.0)
Monocytes Absolute: 0.8 10*3/uL (ref 0.1–1.0)
Monocytes Relative: 8 %
Neutro Abs: 6.9 10*3/uL (ref 1.7–7.7)
Neutrophils Relative %: 73 %
Platelet Count: 249 10*3/uL (ref 150–400)
RBC: 4.37 MIL/uL (ref 3.87–5.11)
RDW: 13.3 % (ref 11.5–15.5)
WBC Count: 9.4 10*3/uL (ref 4.0–10.5)
nRBC: 0 % (ref 0.0–0.2)

## 2022-07-26 LAB — LACTATE DEHYDROGENASE: LDH: 189 U/L (ref 98–192)

## 2022-07-26 NOTE — Progress Notes (Signed)
  [facility] OFFICE PROGRESS NOTE   Diagnosis: CML  INTERVAL HISTORY:   [name] returns as scheduled.  She continues Nilotinib.  She denies nausea/thing.  No mouth sores.  Occasional loose stool.  No rash.  She reports being diagnosed with PMR.  She is on steroids.  She is having anxiety which she feels is related to the steroids.  Objective:  Vital signs in last 24 hours:  Blood pressure (!) 146/68, pulse 72, temperature 98.1 F (36.7 C), temperature source Oral, resp. rate 18, height 5\' 3"  (1.6 m), weight 123 lb 14.4 oz (56.2 kg), SpO2 100 %.    HEENT: No thrush or ulcers. Resp: Lungs clear bilaterally. Cardio: Regular with occasional premature beat. GI: No hepatosplenomegaly. Vascular: No leg edema. Skin: No rash.   Lab Results:  Lab Results  Component Value Date   WBC 9.4 07/26/2022   HGB 13.5 07/26/2022   HCT 39.7 07/26/2022   MCV 90.8 07/26/2022   PLT 249 07/26/2022   NEUTROABS 6.9 07/26/2022    Imaging:  No results found.  Medications: I have reviewed the patient's current medications.  Assessment/Plan: Chronic myelogenous leukemia -- initially treated with hydroxyurea. She began Gleevec on 08/27/2009. Gleevec was placed on hold on 10/06/2009 due to poor tolerance of the 400 mg daily dose. Gleevec was restarted when she had hematologic improvement on 10/13/2009 and the dose was changed to 300 mg on 10/26/2009. The Gleevec dose was reduced to 200 mg daily due to excessive "tearing". The peripheral blood BCR/ABL returned at 16% on 04/19/2010. Gleevec was discontinued secondary to toxicity and a persistent elevation of the peripheral blood translocation . She began Nilotinib on 06/09/2011. Nilotinib placed on hold beginning 08/04/2011 due to fatigue/dyspnea on exertion and slight QT prolongation. Nilotinib was resumed on 08/15/2011 at a dose of 300 mg daily. Peripheral blood PCR was negative on 12/27/2011 and 05/14/2012. Mild elevation of the  peripheral blood PCR on 10/02/2012. Improved on repeat peripheral blood PCR 01/01/2013. Stable 11/25/2014. Improved 05/26/2015. Stable 08/25/2015. Improved 11/24/2015. Patient self reduced dose of Nilotinib from 300 mg daily to 150 mg daily in December 2018 Nilotinib 200 mg daily June 2019 Peripheral blood PCR higher on 09/12/2017 Nilotinib increased to 300 mg daily beginning 09/27/2017 Peripheral blood PCR improved 12/19/2017, stable on 03/20/2018, stable 07/24/2018, stable 11/20/2018, stable 03/27/2019, 07/26/2019, 11/20/2019, 03/17/2020, 07/21/2020, improved 11/24/2020, slightly higher 03/31/2021, higher 07/27/2021, improved 11/23/2021, stable 03/29/2022 Diarrhea. Intermittent, mild. Periorbital edema -improved since stopping Gleevec. Increased "tearing"-improved with discontinuance of Gleevec.   History of a fine papular rash over the trunk and legs-likely secondary to the nilotinib.   Mild QT prolongation and multifocal atrial pacemaker on EKG 08/04/2011. QT normal on EKG 08/26/2014. QTc normal 05/26/2015,08/25/2015,  02/23/2016 , 06/13/2017, 11/19/2019 History of mild hypokalemia -maintained on oral potassium replacement. Potassium in normal range on labs today. Hypertension  Disposition: [name] appears unchanged.  She remains in hematologic remission from CML.  She will continue Nilotinib at the current dose.  We will follow-up on the peripheral blood PCR from today.  She is having anxiety since beginning steroids for PMR.  She is tapering the steroid dose.  Hopefully anxiety will improve as the dose decreases.  She will return for lab, EKG, follow-up in 4 months.  She will contact the office in the interim with any problems.    [name] ANP/GNP-BC   07/26/2022  11:15 AM

## 2022-08-09 ENCOUNTER — Telehealth: Payer: Self-pay

## 2022-08-09 LAB — BCR/ABL

## 2022-08-09 NOTE — Telephone Encounter (Signed)
Called no answer-no VM available

## 2022-08-09 NOTE — Telephone Encounter (Signed)
-----   Message from Gary B Sherrill, MD sent at 08/09/2022  3:33 PM EDT ----- Please call patient, the peripheral blood PCR is better, continue nilotinib, follow-up as scheduled  

## 2022-08-10 ENCOUNTER — Telehealth: Payer: Self-pay | Admitting: *Deleted

## 2022-08-10 NOTE — Telephone Encounter (Signed)
Per Dr. Truett Perna, called pt with message below. Pt verbalized understanding.

## 2022-08-10 NOTE — Telephone Encounter (Signed)
-----   Message from Ladene Artist, MD sent at 08/09/2022  3:33 PM EDT ----- Please call patient, the peripheral blood PCR is better, continue nilotinib, follow-up as scheduled

## 2022-09-06 ENCOUNTER — Telehealth: Payer: Self-pay | Admitting: *Deleted

## 2022-09-06 DIAGNOSIS — C9210 Chronic myeloid leukemia, BCR/ABL-positive, not having achieved remission: Secondary | ICD-10-CM

## 2022-09-06 MED ORDER — NILOTINIB HCL 150 MG PO CAPS
ORAL_CAPSULE | ORAL | 3 refills | Status: DC
Start: 2022-09-06 — End: 2022-12-26

## 2022-09-06 NOTE — Telephone Encounter (Addendum)
Called to request refill on her nilotinib. [employer] needs authorization. Confirmed w/oral oncology team she is still current w/[employer]. Just need to refill the Tasigna and send to [employer].

## 2022-09-07 DIAGNOSIS — M2540 Effusion, unspecified joint: Secondary | ICD-10-CM | POA: Diagnosis not present

## 2022-09-07 DIAGNOSIS — Z79899 Other long term (current) drug therapy: Secondary | ICD-10-CM | POA: Diagnosis not present

## 2022-09-07 DIAGNOSIS — M353 Polymyalgia rheumatica: Secondary | ICD-10-CM | POA: Diagnosis not present

## 2022-09-07 DIAGNOSIS — Z6821 Body mass index (BMI) 21.0-21.9, adult: Secondary | ICD-10-CM | POA: Diagnosis not present

## 2022-09-07 DIAGNOSIS — M2560 Stiffness of unspecified joint, not elsewhere classified: Secondary | ICD-10-CM | POA: Diagnosis not present

## 2022-09-13 ENCOUNTER — Other Ambulatory Visit: Payer: Self-pay | Admitting: Oncology

## 2022-10-06 DIAGNOSIS — Z961 Presence of intraocular lens: Secondary | ICD-10-CM | POA: Diagnosis not present

## 2022-10-06 DIAGNOSIS — H26493 Other secondary cataract, bilateral: Secondary | ICD-10-CM | POA: Diagnosis not present

## 2022-10-06 DIAGNOSIS — M353 Polymyalgia rheumatica: Secondary | ICD-10-CM | POA: Diagnosis not present

## 2022-11-10 DIAGNOSIS — M2540 Effusion, unspecified joint: Secondary | ICD-10-CM | POA: Diagnosis not present

## 2022-11-10 DIAGNOSIS — M2560 Stiffness of unspecified joint, not elsewhere classified: Secondary | ICD-10-CM | POA: Diagnosis not present

## 2022-11-10 DIAGNOSIS — M353 Polymyalgia rheumatica: Secondary | ICD-10-CM | POA: Diagnosis not present

## 2022-11-10 DIAGNOSIS — Z6821 Body mass index (BMI) 21.0-21.9, adult: Secondary | ICD-10-CM | POA: Diagnosis not present

## 2022-11-10 DIAGNOSIS — Z79899 Other long term (current) drug therapy: Secondary | ICD-10-CM | POA: Diagnosis not present

## 2022-11-28 ENCOUNTER — Other Ambulatory Visit: Payer: Self-pay | Admitting: Oncology

## 2022-11-29 ENCOUNTER — Inpatient Hospital Stay: Payer: Medicare HMO | Admitting: Oncology

## 2022-11-29 ENCOUNTER — Inpatient Hospital Stay: Payer: Medicare HMO | Attending: Oncology

## 2022-11-29 ENCOUNTER — Inpatient Hospital Stay: Payer: Medicare HMO

## 2022-11-29 VITALS — BP 127/66 | HR 69 | Temp 97.8°F | Resp 18 | Ht 63.0 in | Wt 120.0 lb

## 2022-11-29 DIAGNOSIS — Z23 Encounter for immunization: Secondary | ICD-10-CM | POA: Insufficient documentation

## 2022-11-29 DIAGNOSIS — C9210 Chronic myeloid leukemia, BCR/ABL-positive, not having achieved remission: Secondary | ICD-10-CM

## 2022-11-29 DIAGNOSIS — R197 Diarrhea, unspecified: Secondary | ICD-10-CM | POA: Diagnosis not present

## 2022-11-29 LAB — CBC WITH DIFFERENTIAL (CANCER CENTER ONLY)
Abs Immature Granulocytes: 0.15 10*3/uL — ABNORMAL HIGH (ref 0.00–0.07)
Basophils Absolute: 0.1 10*3/uL (ref 0.0–0.1)
Basophils Relative: 1 %
Eosinophils Absolute: 0.4 10*3/uL (ref 0.0–0.5)
Eosinophils Relative: 4 %
HCT: 38 % (ref 36.0–46.0)
Hemoglobin: 12.7 g/dL (ref 12.0–15.0)
Immature Granulocytes: 2 %
Lymphocytes Relative: 13 %
Lymphs Abs: 1.3 10*3/uL (ref 0.7–4.0)
MCH: 29.9 pg (ref 26.0–34.0)
MCHC: 33.4 g/dL (ref 30.0–36.0)
MCV: 89.4 fL (ref 80.0–100.0)
Monocytes Absolute: 0.8 10*3/uL (ref 0.1–1.0)
Monocytes Relative: 7 %
Neutro Abs: 7.6 10*3/uL (ref 1.7–7.7)
Neutrophils Relative %: 73 %
Platelet Count: 387 10*3/uL (ref 150–400)
RBC: 4.25 MIL/uL (ref 3.87–5.11)
RDW: 15.3 % (ref 11.5–15.5)
WBC Count: 10.3 10*3/uL (ref 4.0–10.5)
nRBC: 0 % (ref 0.0–0.2)

## 2022-11-29 LAB — CMP (CANCER CENTER ONLY)
ALT: 17 U/L (ref 0–44)
AST: 18 U/L (ref 15–41)
Albumin: 4.4 g/dL (ref 3.5–5.0)
Alkaline Phosphatase: 42 U/L (ref 38–126)
Anion gap: 8 (ref 5–15)
BUN: 20 mg/dL (ref 8–23)
CO2: 28 mmol/L (ref 22–32)
Calcium: 10.1 mg/dL (ref 8.9–10.3)
Chloride: 103 mmol/L (ref 98–111)
Creatinine: 1.23 mg/dL — ABNORMAL HIGH (ref 0.44–1.00)
GFR, Estimated: 42 mL/min — ABNORMAL LOW (ref >60)
Glucose, Bld: 167 mg/dL — ABNORMAL HIGH (ref 70–99)
Potassium: 3.7 mmol/L (ref 3.5–5.1)
Sodium: 139 mmol/L (ref 135–145)
Total Bilirubin: 1 mg/dL (ref 0.3–1.2)
Total Protein: 7.4 g/dL (ref 6.5–8.1)

## 2022-11-29 MED ORDER — INFLUENZA VAC A&B SURF ANT ADJ 0.5 ML IM SUSY
0.5000 mL | PREFILLED_SYRINGE | Freq: Once | INTRAMUSCULAR | Status: AC
  Administered 2022-11-29: 0.5 mL via INTRAMUSCULAR
  Filled 2022-11-29: qty 0.5

## 2022-11-29 NOTE — Progress Notes (Signed)
[facility] OFFICE PROGRESS NOTE   Diagnosis: CML  INTERVAL HISTORY:   [name] returns as scheduled.  She feels well.  She continues nilotinib.  She reports starting methotrexate approximately 3 months ago for "arthritis ".  She was previously on prednisone.  Methotrexate has helped the arthritis.  She has occasional diarrhea since starting methotrexate.  Objective:  Vital signs in last 24 hours:  Blood pressure 127/66, pulse 69, temperature 97.8 F (36.6 C), temperature source Temporal, resp. rate 18, height 5\' 3"  (1.6 m), weight 120 lb (54.4 kg), SpO2 98%.    HEENT: No thrush or ulcers Resp: Clear bilaterally Cardio: Regular rhythm, 2/6 systolic murmur GI: No hepatosplenomegaly Vascular: No leg edema  Skin: No rash, dryness over the legs  Lab Results:  Lab Results  Component Value Date   WBC 10.3 11/29/2022   HGB 12.7 11/29/2022   HCT 38.0 11/29/2022   MCV 89.4 11/29/2022   PLT 387 11/29/2022   NEUTROABS 7.6 11/29/2022    CMP  Lab Results  Component Value Date   NA 140 07/26/2022   K 4.0 07/26/2022   CL 105 07/26/2022   CO2 27 07/26/2022   GLUCOSE 102 (H) 07/26/2022   BUN 20 07/26/2022   CREATININE 1.12 (H) 07/26/2022   CALCIUM 9.6 07/26/2022   PROT 6.8 07/26/2022   ALBUMIN 4.2 07/26/2022   AST 13 (L) 07/26/2022   ALT 15 07/26/2022   ALKPHOS 32 (L) 07/26/2022   BILITOT 1.1 07/26/2022   GFRNONAA 47 (L) 07/26/2022   GFRAA 48 (L) 07/23/2019     Medications: I have reviewed the patient's current medications.   Assessment/Plan: Chronic myelogenous leukemia -- initially treated with hydroxyurea. She began Gleevec on 08/27/2009. Gleevec was placed on hold on 10/06/2009 due to poor tolerance of the 400 mg daily dose. Gleevec was restarted when she had hematologic improvement on 10/13/2009 and the dose was changed to 300 mg on 10/26/2009. The Gleevec dose was reduced to 200 mg daily due to excessive "tearing". The peripheral blood BCR/ABL  returned at 16% on 04/19/2010. Gleevec was discontinued secondary to toxicity and a persistent elevation of the peripheral blood translocation . She began Nilotinib on 06/09/2011. Nilotinib placed on hold beginning 08/04/2011 due to fatigue/dyspnea on exertion and slight QT prolongation. Nilotinib was resumed on 08/15/2011 at a dose of 300 mg daily. Peripheral blood PCR was negative on 12/27/2011 and 05/14/2012. Mild elevation of the peripheral blood PCR on 10/02/2012. Improved on repeat peripheral blood PCR 01/01/2013. Stable 11/25/2014. Improved 05/26/2015. Stable 08/25/2015. Improved 11/24/2015. Patient self reduced dose of Nilotinib from 300 mg daily to 150 mg daily in December 2018 Nilotinib 200 mg daily June 2019 Peripheral blood PCR higher on 09/12/2017 Nilotinib increased to 300 mg daily beginning 09/27/2017 Peripheral blood PCR improved 12/19/2017, stable on 03/20/2018, stable 07/24/2018, stable 11/20/2018, stable 03/27/2019, 07/26/2019, 11/20/2019, 03/17/2020, 07/21/2020, improved 11/24/2020, slightly higher 03/31/2021, higher 07/27/2021, improved 11/23/2021, stable 03/29/2022, improved 07/26/2022 Diarrhea. Intermittent, mild. Periorbital edema -improved since stopping Gleevec. Increased "tearing"-improved with discontinuance of Gleevec.   History of a fine papular rash over the trunk and legs-likely secondary to the nilotinib.   Mild QT prolongation and multifocal atrial pacemaker on EKG 08/04/2011. QT normal on EKG 08/26/2014. QTc normal 05/26/2015,08/25/2015,  02/23/2016 , 06/13/2017, 11/19/2019 History of mild hypokalemia -maintained on oral potassium replacement. Potassium in normal range on labs today. Hypertension    Disposition: Ms [name] appears stable.  She remains in hematologic remission from CML.  We will follow-up on  the peripheral blood PCR from today.  She will continue nilotinib.  She is now taking methotrexate for arthritis and reports monthly follow-up with rheumatology.  She will  return for an office and lab visit in 4 months. She will have an EKG today.  She received an influenza vaccine today.  Thornton Papas, MD  11/29/2022  10:42 AM

## 2022-11-30 ENCOUNTER — Other Ambulatory Visit: Payer: Self-pay | Admitting: *Deleted

## 2022-11-30 DIAGNOSIS — C9210 Chronic myeloid leukemia, BCR/ABL-positive, not having achieved remission: Secondary | ICD-10-CM

## 2022-12-06 ENCOUNTER — Other Ambulatory Visit (HOSPITAL_COMMUNITY): Payer: Self-pay

## 2022-12-07 DIAGNOSIS — Z961 Presence of intraocular lens: Secondary | ICD-10-CM | POA: Diagnosis not present

## 2022-12-07 DIAGNOSIS — H26493 Other secondary cataract, bilateral: Secondary | ICD-10-CM | POA: Diagnosis not present

## 2022-12-07 DIAGNOSIS — Z9889 Other specified postprocedural states: Secondary | ICD-10-CM | POA: Diagnosis not present

## 2022-12-13 ENCOUNTER — Telehealth: Payer: Self-pay

## 2022-12-13 NOTE — Telephone Encounter (Signed)
Called and spoke to patient regarding re-enrollment application. Patient would like to come by office by end of week (12/16/22) to sign application and drop off Proof of Income. MD Office has been notified. I will submit once signatures and financial documents are returned back to me. I will continue to follow and update until final determination.    Ardeen Fillers, CPhT Oncology Pharmacy Patient Advocate  [facility]  [phone] (phone) [phone] (fax) 12/13/2022 8:45 AM

## 2022-12-13 NOTE — Telephone Encounter (Signed)
Oral Oncology Patient Advocate Encounter   Received notification that patient is due for re-enrollment for assistance for Tasigna through [employer].   Re-enrollment process has been initiated and will be submitted upon completion of necessary documents.  NPA[employer]'s phone number [phone].   I will continue to follow until final determination.   Ardeen Fillers, CPhT Oncology Pharmacy Patient Advocate  [facility]  [phone] (phone) [phone] (fax) 12/13/2022 8:43 AM

## 2022-12-15 DIAGNOSIS — M353 Polymyalgia rheumatica: Secondary | ICD-10-CM | POA: Diagnosis not present

## 2022-12-16 NOTE — Telephone Encounter (Signed)
Oral Oncology Patient Advocate Encounter   Submitted application for assistance for Tasigna to [employer].   Application submitted via e-fax to [phone]   NPAF's phone number [phone].   I will continue to check the status until final determination.    Ardeen Fillers, CPhT Oncology Pharmacy Patient Advocate  [facility]  [phone] (phone) [phone] (fax) 12/16/2022

## 2022-12-19 DIAGNOSIS — Z79899 Other long term (current) drug therapy: Secondary | ICD-10-CM | POA: Diagnosis not present

## 2022-12-19 DIAGNOSIS — M2560 Stiffness of unspecified joint, not elsewhere classified: Secondary | ICD-10-CM | POA: Diagnosis not present

## 2022-12-19 DIAGNOSIS — Z6821 Body mass index (BMI) 21.0-21.9, adult: Secondary | ICD-10-CM | POA: Diagnosis not present

## 2022-12-19 DIAGNOSIS — M2540 Effusion, unspecified joint: Secondary | ICD-10-CM | POA: Diagnosis not present

## 2022-12-19 DIAGNOSIS — M353 Polymyalgia rheumatica: Secondary | ICD-10-CM | POA: Diagnosis not present

## 2022-12-26 ENCOUNTER — Other Ambulatory Visit: Payer: Self-pay | Admitting: *Deleted

## 2022-12-26 DIAGNOSIS — C9210 Chronic myeloid leukemia, BCR/ABL-positive, not having achieved remission: Secondary | ICD-10-CM

## 2022-12-26 MED ORDER — NILOTINIB HCL 150 MG PO CAPS: ORAL_CAPSULE | ORAL | 3 refills | Status: DC

## 2022-12-26 NOTE — Telephone Encounter (Signed)
Received notification from [employer]PA[employer] that patient was required to apply for Medicare Extra Help before re-enrollment application could continue processing. I called patient and submitted application for Medicare Extra Help via Social Security Administration web portal. Patient knows to bring in copy of denial or approval letter once received. I will submit letter of denial or approval once in hand. I will continue to follow and update until final determination.     Ardeen Fillers, CPhT Oncology Pharmacy Patient Advocate  [facility]  [phone] (phone) [phone] (fax) 12/26/2022 8:56 AM

## 2022-12-26 NOTE — Telephone Encounter (Signed)
Patient called requesting refill on her nilotinib. Refill sent.

## 2022-12-29 ENCOUNTER — Telehealth: Payer: Self-pay | Admitting: *Deleted

## 2022-12-29 NOTE — Telephone Encounter (Signed)
Ms. Vallez called to check on status of her Tasigna. Called Cover[facility]Meds pharmacy and was transferred to [employer] team. Confirmed that script was received and is being processed. Should hear about delivery by Friday. Patient notified

## 2023-01-10 DIAGNOSIS — M353 Polymyalgia rheumatica: Secondary | ICD-10-CM | POA: Diagnosis not present

## 2023-01-10 DIAGNOSIS — E1122 Type 2 diabetes mellitus with diabetic chronic kidney disease: Secondary | ICD-10-CM | POA: Diagnosis not present

## 2023-01-10 DIAGNOSIS — N1831 Chronic kidney disease, stage 3a: Secondary | ICD-10-CM | POA: Diagnosis not present

## 2023-01-10 DIAGNOSIS — E782 Mixed hyperlipidemia: Secondary | ICD-10-CM | POA: Diagnosis not present

## 2023-01-10 DIAGNOSIS — C9210 Chronic myeloid leukemia, BCR/ABL-positive, not having achieved remission: Secondary | ICD-10-CM | POA: Diagnosis not present

## 2023-01-10 DIAGNOSIS — D84821 Immunodeficiency due to drugs: Secondary | ICD-10-CM | POA: Diagnosis not present

## 2023-01-10 DIAGNOSIS — I129 Hypertensive chronic kidney disease with stage 1 through stage 4 chronic kidney disease, or unspecified chronic kidney disease: Secondary | ICD-10-CM | POA: Diagnosis not present

## 2023-01-12 DIAGNOSIS — Z79899 Other long term (current) drug therapy: Secondary | ICD-10-CM | POA: Diagnosis not present

## 2023-01-12 DIAGNOSIS — M2560 Stiffness of unspecified joint, not elsewhere classified: Secondary | ICD-10-CM | POA: Diagnosis not present

## 2023-01-12 DIAGNOSIS — Z6821 Body mass index (BMI) 21.0-21.9, adult: Secondary | ICD-10-CM | POA: Diagnosis not present

## 2023-01-12 DIAGNOSIS — M353 Polymyalgia rheumatica: Secondary | ICD-10-CM | POA: Diagnosis not present

## 2023-01-12 DIAGNOSIS — M2540 Effusion, unspecified joint: Secondary | ICD-10-CM | POA: Diagnosis not present

## 2023-01-23 NOTE — Telephone Encounter (Signed)
Received Medicare Extra Help Denial Letter from patient and have sent that to [employer]PA[employer] via e-fax at [phone]. I will continue to follow and update until final determination.    Ardeen Fillers, CPhT Oncology Pharmacy Patient Advocate  [facility]  [phone] (phone) [phone] (fax) 01/23/2023 3:52 PM

## 2023-01-24 NOTE — Telephone Encounter (Signed)
Oral Oncology Patient Advocate Encounter   Received notification re-enrollment for assistance for Tasigna through [employer] has been approved. Patient may continue to receive their medication at $0 from this program.    NPAF's phone number [phone].   Effective dates: 02/08/23 through 02/07/24.   Ardeen Fillers, CPhT Oncology Pharmacy Patient Advocate  [facility]  [phone] (phone) [phone] (fax) 01/24/2023 10:29 AM

## 2023-02-15 DIAGNOSIS — H26491 Other secondary cataract, right eye: Secondary | ICD-10-CM | POA: Diagnosis not present

## 2023-03-16 DIAGNOSIS — M353 Polymyalgia rheumatica: Secondary | ICD-10-CM | POA: Diagnosis not present

## 2023-03-16 DIAGNOSIS — M2540 Effusion, unspecified joint: Secondary | ICD-10-CM | POA: Diagnosis not present

## 2023-03-16 DIAGNOSIS — M2560 Stiffness of unspecified joint, not elsewhere classified: Secondary | ICD-10-CM | POA: Diagnosis not present

## 2023-03-16 DIAGNOSIS — Z6821 Body mass index (BMI) 21.0-21.9, adult: Secondary | ICD-10-CM | POA: Diagnosis not present

## 2023-03-16 DIAGNOSIS — Z79899 Other long term (current) drug therapy: Secondary | ICD-10-CM | POA: Diagnosis not present

## 2023-03-28 ENCOUNTER — Inpatient Hospital Stay: Payer: Medicare HMO

## 2023-03-28 ENCOUNTER — Other Ambulatory Visit: Payer: Medicare HMO

## 2023-03-28 ENCOUNTER — Telehealth: Payer: Self-pay

## 2023-03-28 ENCOUNTER — Encounter: Payer: Self-pay | Admitting: Nurse Practitioner

## 2023-03-28 ENCOUNTER — Inpatient Hospital Stay: Payer: Medicare HMO | Attending: Nurse Practitioner | Admitting: Nurse Practitioner

## 2023-03-28 VITALS — BP 118/60 | HR 69 | Temp 98.2°F | Resp 18 | Ht 63.0 in | Wt 120.8 lb

## 2023-03-28 DIAGNOSIS — R001 Bradycardia, unspecified: Secondary | ICD-10-CM | POA: Diagnosis not present

## 2023-03-28 DIAGNOSIS — C9210 Chronic myeloid leukemia, BCR/ABL-positive, not having achieved remission: Secondary | ICD-10-CM | POA: Insufficient documentation

## 2023-03-28 DIAGNOSIS — R110 Nausea: Secondary | ICD-10-CM | POA: Diagnosis not present

## 2023-03-28 LAB — CMP (CANCER CENTER ONLY)
ALT: 25 U/L (ref 0–44)
AST: 23 U/L (ref 15–41)
Albumin: 4.1 g/dL (ref 3.5–5.0)
Alkaline Phosphatase: 40 U/L (ref 38–126)
Anion gap: 7 (ref 5–15)
BUN: 27 mg/dL — ABNORMAL HIGH (ref 8–23)
CO2: 28 mmol/L (ref 22–32)
Calcium: 9.5 mg/dL (ref 8.9–10.3)
Chloride: 103 mmol/L (ref 98–111)
Creatinine: 1.2 mg/dL — ABNORMAL HIGH (ref 0.44–1.00)
GFR, Estimated: 44 mL/min — ABNORMAL LOW (ref >60)
Glucose, Bld: 117 mg/dL — ABNORMAL HIGH (ref 70–99)
Potassium: 3.9 mmol/L (ref 3.5–5.1)
Sodium: 138 mmol/L (ref 135–145)
Total Bilirubin: 0.9 mg/dL (ref 0.0–1.2)
Total Protein: 7 g/dL (ref 6.5–8.1)

## 2023-03-28 LAB — CBC WITH DIFFERENTIAL (CANCER CENTER ONLY)
Abs Immature Granulocytes: 0.08 10*3/uL — ABNORMAL HIGH (ref 0.00–0.07)
Basophils Absolute: 0.1 10*3/uL (ref 0.0–0.1)
Basophils Relative: 1 %
Eosinophils Absolute: 0.4 10*3/uL (ref 0.0–0.5)
Eosinophils Relative: 4 %
HCT: 36.3 % (ref 36.0–46.0)
Hemoglobin: 12.3 g/dL (ref 12.0–15.0)
Immature Granulocytes: 1 %
Lymphocytes Relative: 10 %
Lymphs Abs: 0.9 10*3/uL (ref 0.7–4.0)
MCH: 31.5 pg (ref 26.0–34.0)
MCHC: 33.9 g/dL (ref 30.0–36.0)
MCV: 92.8 fL (ref 80.0–100.0)
Monocytes Absolute: 0.5 10*3/uL (ref 0.1–1.0)
Monocytes Relative: 6 %
Neutro Abs: 6.7 10*3/uL (ref 1.7–7.7)
Neutrophils Relative %: 78 %
Platelet Count: 277 10*3/uL (ref 150–400)
RBC: 3.91 MIL/uL (ref 3.87–5.11)
RDW: 14.8 % (ref 11.5–15.5)
WBC Count: 8.6 10*3/uL (ref 4.0–10.5)
nRBC: 0 % (ref 0.0–0.2)

## 2023-03-28 NOTE — Telephone Encounter (Signed)
 I contacted Dr. Alver Fisher office to inform the nurse that the patient recently underwent an EKG, which indicated a heart rate of 58. The patient is experiencing intermittent lightheadedness. My provider would like to review her atenolol dose to determine if an adjustment is necessary. Dr. Alver Fisher office will reach out to the patient to address her concerns. Additionally, I contacted [facility] and left a message for Dr. Genevive Bi regarding the patient's nausea related to methotrexate. I inquired about their recommendations for an appropriate antiemetic.

## 2023-03-28 NOTE — Progress Notes (Addendum)
 [facility] OFFICE PROGRESS NOTE   Diagnosis: CML  INTERVAL HISTORY:   [name] returns as scheduled.  She continues to Nilotinib.  She also continues methotrexate for arthritis.  She notes nausea for several days after each dose of methotrexate.  She does not have an antiemetic.  She continues to have intermittent loose stools.  No mouth sores.  No rash.  Objective:  Vital signs in last 24 hours:  Blood pressure 118/60, pulse 69, temperature 98.2 F (36.8 C), temperature source Temporal, resp. rate 18, height 5\' 3"  (1.6 m), weight 120 lb 12.8 oz (54.8 kg), SpO2 99%.    HEENT: No thrush or ulcers. Resp: Lungs clear bilaterally. Cardio: Regular rate and rhythm. GI: No hepatosplenomegaly. Vascular: No leg edema. Skin: No rash.   Lab Results:  Lab Results  Component Value Date   WBC 8.6 03/28/2023   HGB 12.3 03/28/2023   HCT 36.3 03/28/2023   MCV 92.8 03/28/2023   PLT 277 03/28/2023   NEUTROABS 6.7 03/28/2023    Imaging:  No results found.  Medications: I have reviewed the patient's current medications.  Assessment/Plan: Chronic myelogenous leukemia -- initially treated with hydroxyurea. She began Gleevec on 08/27/2009. Gleevec was placed on hold on 10/06/2009 due to poor tolerance of the 400 mg daily dose. Gleevec was restarted when she had hematologic improvement on 10/13/2009 and the dose was changed to 300 mg on 10/26/2009. The Gleevec dose was reduced to 200 mg daily due to excessive "tearing". The peripheral blood BCR/ABL returned at 16% on 04/19/2010. Gleevec was discontinued secondary to toxicity and a persistent elevation of the peripheral blood translocation . She began Nilotinib on 06/09/2011. Nilotinib placed on hold beginning 08/04/2011 due to fatigue/dyspnea on exertion and slight QT prolongation. Nilotinib was resumed on 08/15/2011 at a dose of 300 mg daily. Peripheral blood PCR was negative on 12/27/2011 and 05/14/2012. Mild elevation of the  peripheral blood PCR on 10/02/2012. Improved on repeat peripheral blood PCR 01/01/2013. Stable 11/25/2014. Improved 05/26/2015. Stable 08/25/2015. Improved 11/24/2015. Patient self reduced dose of Nilotinib from 300 mg daily to 150 mg daily in December 2018 Nilotinib 200 mg daily June 2019 Peripheral blood PCR higher on 09/12/2017 Nilotinib increased to 300 mg daily beginning 09/27/2017 Peripheral blood PCR improved 12/19/2017, stable on 03/20/2018, stable 07/24/2018, stable 11/20/2018, stable 03/27/2019, 07/26/2019, 11/20/2019, 03/17/2020, 07/21/2020, improved 11/24/2020, slightly higher 03/31/2021, higher 07/27/2021, improved 11/23/2021, stable 03/29/2022, improved 07/26/2022 Diarrhea. Intermittent, mild. Periorbital edema -improved since stopping Gleevec. Increased "tearing"-improved with discontinuance of Gleevec.   History of a fine papular rash over the trunk and legs-likely secondary to the nilotinib.   Mild QT prolongation and multifocal atrial pacemaker on EKG 08/04/2011. QT normal on EKG 08/26/2014. QTc normal 05/26/2015,08/25/2015,  02/23/2016 , 06/13/2017, 11/19/2019 History of mild hypokalemia -maintained on oral potassium replacement. Potassium in normal range on labs today. Hypertension  Disposition: Ms. Houpt appears stable.  She remains in hematologic remission from CML.  We will follow-up on the peripheral blood PCR from today.  She will continue Nilotinib.  We are obtaining an EKG today.  She is taking methotrexate for arthritis.  She has nausea for several days after each dose.  I offered to prescribe an antiemetic.  She prefers we contact rheumatology and request their recommendation.  CBC and chemistry panel reviewed.  Labs adequate to continue Nilotinib.  She will return for lab, follow-up, EKG in 4 months.  We are available to see her sooner if needed.   Lonna Cobb ANP/GNP-BC  03/28/2023  10:55 AM  Addendum 11:55 AM-EKG showed QTc in an acceptable range.  Heart rate 46, 49.   Manual heart rate 56-60.  She is on atenolol.  We will contact her PCP about the bradycardia and see if they want to adjust the atenolol dose.

## 2023-04-05 DIAGNOSIS — E1122 Type 2 diabetes mellitus with diabetic chronic kidney disease: Secondary | ICD-10-CM | POA: Diagnosis not present

## 2023-04-05 DIAGNOSIS — D8481 Immunodeficiency due to conditions classified elsewhere: Secondary | ICD-10-CM | POA: Diagnosis not present

## 2023-04-05 DIAGNOSIS — N1831 Chronic kidney disease, stage 3a: Secondary | ICD-10-CM | POA: Diagnosis not present

## 2023-04-05 DIAGNOSIS — R5383 Other fatigue: Secondary | ICD-10-CM | POA: Diagnosis not present

## 2023-04-05 DIAGNOSIS — M353 Polymyalgia rheumatica: Secondary | ICD-10-CM | POA: Diagnosis not present

## 2023-04-05 DIAGNOSIS — C9210 Chronic myeloid leukemia, BCR/ABL-positive, not having achieved remission: Secondary | ICD-10-CM | POA: Diagnosis not present

## 2023-04-05 DIAGNOSIS — I129 Hypertensive chronic kidney disease with stage 1 through stage 4 chronic kidney disease, or unspecified chronic kidney disease: Secondary | ICD-10-CM | POA: Diagnosis not present

## 2023-04-05 LAB — BCR-ABL1, CML/ALL, PCR, QUANT
E1A2 Transcript: <0.0032 %
b2a2 transcript: 0.2401 %
b3a2 transcript: <0.0032 %

## 2023-04-12 DIAGNOSIS — Z9889 Other specified postprocedural states: Secondary | ICD-10-CM | POA: Diagnosis not present

## 2023-04-12 DIAGNOSIS — H02834 Dermatochalasis of left upper eyelid: Secondary | ICD-10-CM | POA: Diagnosis not present

## 2023-04-12 DIAGNOSIS — Z961 Presence of intraocular lens: Secondary | ICD-10-CM | POA: Diagnosis not present

## 2023-04-12 DIAGNOSIS — H02831 Dermatochalasis of right upper eyelid: Secondary | ICD-10-CM | POA: Diagnosis not present

## 2023-06-22 DIAGNOSIS — Z6820 Body mass index (BMI) 20.0-20.9, adult: Secondary | ICD-10-CM | POA: Diagnosis not present

## 2023-06-22 DIAGNOSIS — M2540 Effusion, unspecified joint: Secondary | ICD-10-CM | POA: Diagnosis not present

## 2023-06-22 DIAGNOSIS — M2560 Stiffness of unspecified joint, not elsewhere classified: Secondary | ICD-10-CM | POA: Diagnosis not present

## 2023-06-22 DIAGNOSIS — M353 Polymyalgia rheumatica: Secondary | ICD-10-CM | POA: Diagnosis not present

## 2023-06-22 DIAGNOSIS — Z79899 Other long term (current) drug therapy: Secondary | ICD-10-CM | POA: Diagnosis not present

## 2023-07-04 ENCOUNTER — Other Ambulatory Visit: Payer: Self-pay | Admitting: *Deleted

## 2023-07-04 DIAGNOSIS — C9210 Chronic myeloid leukemia, BCR/ABL-positive, not having achieved remission: Secondary | ICD-10-CM

## 2023-07-04 MED ORDER — NILOTINIB HCL 150 MG PO CAPS: ORAL_CAPSULE | ORAL | 3 refills | Status: DC

## 2023-07-04 NOTE — Telephone Encounter (Signed)
 Fax from [facility] pharmacy for Tasigna  150 mg. Refills approved.

## 2023-07-25 ENCOUNTER — Inpatient Hospital Stay: Payer: Medicare HMO | Attending: Oncology

## 2023-07-25 ENCOUNTER — Inpatient Hospital Stay (HOSPITAL_BASED_OUTPATIENT_CLINIC_OR_DEPARTMENT_OTHER): Payer: Medicare HMO | Admitting: Oncology

## 2023-07-25 VITALS — BP 138/60 | HR 60 | Temp 98.1°F | Resp 18 | Ht 63.0 in | Wt 112.0 lb

## 2023-07-25 DIAGNOSIS — C9210 Chronic myeloid leukemia, BCR/ABL-positive, not having achieved remission: Secondary | ICD-10-CM

## 2023-07-25 DIAGNOSIS — R197 Diarrhea, unspecified: Secondary | ICD-10-CM | POA: Diagnosis not present

## 2023-07-25 DIAGNOSIS — C9211 Chronic myeloid leukemia, BCR/ABL-positive, in remission: Secondary | ICD-10-CM | POA: Diagnosis not present

## 2023-07-25 DIAGNOSIS — I10 Essential (primary) hypertension: Secondary | ICD-10-CM | POA: Insufficient documentation

## 2023-07-25 LAB — CMP (CANCER CENTER ONLY)
ALT: 15 U/L (ref 0–44)
AST: 19 U/L (ref 15–41)
Albumin: 4.1 g/dL (ref 3.5–5.0)
Alkaline Phosphatase: 44 U/L (ref 38–126)
Anion gap: 12 (ref 5–15)
BUN: 25 mg/dL — ABNORMAL HIGH (ref 8–23)
CO2: 26 mmol/L (ref 22–32)
Calcium: 10.6 mg/dL — ABNORMAL HIGH (ref 8.9–10.3)
Chloride: 101 mmol/L (ref 98–111)
Creatinine: 1.3 mg/dL — ABNORMAL HIGH (ref 0.44–1.00)
GFR, Estimated: 39 mL/min — ABNORMAL LOW (ref >60)
Glucose, Bld: 109 mg/dL — ABNORMAL HIGH (ref 70–99)
Potassium: 4.2 mmol/L (ref 3.5–5.1)
Sodium: 139 mmol/L (ref 135–145)
Total Bilirubin: 0.7 mg/dL (ref 0.0–1.2)
Total Protein: 7.4 g/dL (ref 6.5–8.1)

## 2023-07-25 LAB — CBC WITH DIFFERENTIAL (CANCER CENTER ONLY)
Abs Immature Granulocytes: 0.04 10*3/uL (ref 0.00–0.07)
Basophils Absolute: 0.1 10*3/uL (ref 0.0–0.1)
Basophils Relative: 1 %
Eosinophils Absolute: 0.4 10*3/uL (ref 0.0–0.5)
Eosinophils Relative: 5 %
HCT: 34.3 % — ABNORMAL LOW (ref 36.0–46.0)
Hemoglobin: 11.5 g/dL — ABNORMAL LOW (ref 12.0–15.0)
Immature Granulocytes: 1 %
Lymphocytes Relative: 17 %
Lymphs Abs: 1.5 10*3/uL (ref 0.7–4.0)
MCH: 30.3 pg (ref 26.0–34.0)
MCHC: 33.5 g/dL (ref 30.0–36.0)
MCV: 90.5 fL (ref 80.0–100.0)
Monocytes Absolute: 0.7 10*3/uL (ref 0.1–1.0)
Monocytes Relative: 8 %
Neutro Abs: 5.9 10*3/uL (ref 1.7–7.7)
Neutrophils Relative %: 68 %
Platelet Count: 213 10*3/uL (ref 150–400)
RBC: 3.79 MIL/uL — ABNORMAL LOW (ref 3.87–5.11)
RDW: 13.9 % (ref 11.5–15.5)
WBC Count: 8.6 10*3/uL (ref 4.0–10.5)
nRBC: 0 % (ref 0.0–0.2)

## 2023-07-25 NOTE — Progress Notes (Signed)
 [facility] OFFICE PROGRESS NOTE   Diagnosis: CML  INTERVAL HISTORY:   [name] returns as scheduled.  She continues nilotinib .  She has intermittent diarrhea.  No rash.  She was treated with methotrexate for PMR.  She reports methotrexate was discontinued approximately 1 month ago secondary to elevated liver enzymes.  She reports improvement in pain and her appetite.  Objective:  Vital signs in last 24 hours:  Blood pressure 138/60, pulse 60, temperature 98.1 F (36.7 C), temperature source Temporal, resp. rate 18, height 5' 3 (1.6 m), weight 112 lb (50.8 kg), SpO2 100%.    HEENT: No thrush or ulcers Resp: Lungs clear bilaterally Cardio: Regular rate and rhythm GI: No hepatosplenomegaly Vascular: No leg edema   Lab Results:  Lab Results  Component Value Date   WBC 8.6 07/25/2023   HGB 11.5 (L) 07/25/2023   HCT 34.3 (L) 07/25/2023   MCV 90.5 07/25/2023   PLT 213 07/25/2023   NEUTROABS 5.9 07/25/2023    CMP  Lab Results  Component Value Date   NA 139 07/25/2023   K 4.2 07/25/2023   CL 101 07/25/2023   CO2 26 07/25/2023   GLUCOSE 109 (H) 07/25/2023   BUN 25 (H) 07/25/2023   CREATININE 1.30 (H) 07/25/2023   CALCIUM 10.6 (H) 07/25/2023   PROT 7.4 07/25/2023   ALBUMIN 4.1 07/25/2023   AST 19 07/25/2023   ALT 15 07/25/2023   ALKPHOS 44 07/25/2023   BILITOT 0.7 07/25/2023   GFRNONAA 39 (L) 07/25/2023   GFRAA 48 (L) 07/23/2019     Medications: I have reviewed the patient's current medications.   Assessment/Plan: Chronic myelogenous leukemia -- initially treated with hydroxyurea. She began Gleevec  on 08/27/2009. Gleevec  was placed on hold on 10/06/2009 due to poor tolerance of the 400 mg daily dose. Gleevec  was restarted when she had hematologic improvement on 10/13/2009 and the dose was changed to 300 mg on 10/26/2009. The Gleevec  dose was reduced to 200 mg daily due to excessive tearing. The peripheral blood BCR/ABL returned at 16% on  04/19/2010. Gleevec  was discontinued secondary to toxicity and a persistent elevation of the peripheral blood translocation . She began Nilotinib  on 06/09/2011. Nilotinib  placed on hold beginning 08/04/2011 due to fatigue/dyspnea on exertion and slight QT prolongation. Nilotinib  was resumed on 08/15/2011 at a dose of 300 mg daily. Peripheral blood PCR was negative on 12/27/2011 and 05/14/2012. Mild elevation of the peripheral blood PCR on 10/02/2012. Improved on repeat peripheral blood PCR 01/01/2013. Stable 11/25/2014. Improved 05/26/2015. Stable 08/25/2015. Improved 11/24/2015. Patient self reduced dose of Nilotinib  from 300 mg daily to 150 mg daily in December 2018 Nilotinib  200 mg daily June 2019 Peripheral blood PCR higher on 09/12/2017 Nilotinib  increased to 300 mg daily beginning 09/27/2017 Peripheral blood PCR improved 12/19/2017, stable on 03/20/2018, stable 07/24/2018, stable 11/20/2018, stable 03/27/2019, 07/26/2019, 11/20/2019, 03/17/2020, 07/21/2020, improved 11/24/2020, slightly higher 03/31/2021, higher 07/27/2021, improved 11/23/2021, stable 03/29/2022, improved 07/26/2022, improved to 18 2025 Diarrhea. Intermittent, mild. Periorbital edema -improved since stopping Gleevec . Increased tearing-improved with discontinuance of Gleevec .   History of a fine papular rash over the trunk and legs-likely secondary to the nilotinib .   Mild QT prolongation and multifocal atrial pacemaker on EKG 08/04/2011. QT normal on EKG 08/26/2014. QTc normal 05/26/2015,08/25/2015,  02/23/2016 , 06/13/2017, 11/19/2019 History of mild hypokalemia -maintained on oral potassium replacement. Potassium in normal range on labs today. Hypertension Polymyalgia rheumatica    Disposition: [name] appears stable.  She is in hematologic remission from CML.  The hemoglobin is slightly lower today, potentially related to recent methotrexate therapy.  The plan is to continue nilotinib  at the current dose.  We will follow-up on the  peripheral blood PCR from today.  She will return for an office and lab visit in 4 months. The calcium is mildly elevated today.  This is likely a benign nonspecific finding.  We will request a repeat calcium level when she sees Dr. Bernetta Brilliant next week.  [name] reports recent weight loss with a poor appetite.  She reports her appetite is improved at present.  Coni Deep, MD  07/25/2023  10:45 AM

## 2023-07-27 DIAGNOSIS — M2540 Effusion, unspecified joint: Secondary | ICD-10-CM | POA: Diagnosis not present

## 2023-07-27 DIAGNOSIS — M353 Polymyalgia rheumatica: Secondary | ICD-10-CM | POA: Diagnosis not present

## 2023-07-27 DIAGNOSIS — Z681 Body mass index (BMI) 19 or less, adult: Secondary | ICD-10-CM | POA: Diagnosis not present

## 2023-07-27 DIAGNOSIS — M2560 Stiffness of unspecified joint, not elsewhere classified: Secondary | ICD-10-CM | POA: Diagnosis not present

## 2023-07-27 DIAGNOSIS — Z79899 Other long term (current) drug therapy: Secondary | ICD-10-CM | POA: Diagnosis not present

## 2023-07-28 ENCOUNTER — Other Ambulatory Visit: Payer: Medicare HMO

## 2023-07-28 ENCOUNTER — Ambulatory Visit: Payer: Medicare HMO | Admitting: Oncology

## 2023-07-29 LAB — BCR-ABL1, CML/ALL, PCR, QUANT
E1A2 Transcript: <0.0032 %
b2a2 transcript: 0.1021 %
b3a2 transcript: <0.0032 %

## 2023-08-03 DIAGNOSIS — C9210 Chronic myeloid leukemia, BCR/ABL-positive, not having achieved remission: Secondary | ICD-10-CM | POA: Diagnosis not present

## 2023-08-03 DIAGNOSIS — J309 Allergic rhinitis, unspecified: Secondary | ICD-10-CM | POA: Diagnosis not present

## 2023-08-03 DIAGNOSIS — N1831 Chronic kidney disease, stage 3a: Secondary | ICD-10-CM | POA: Diagnosis not present

## 2023-08-03 DIAGNOSIS — E782 Mixed hyperlipidemia: Secondary | ICD-10-CM | POA: Diagnosis not present

## 2023-08-03 DIAGNOSIS — I129 Hypertensive chronic kidney disease with stage 1 through stage 4 chronic kidney disease, or unspecified chronic kidney disease: Secondary | ICD-10-CM | POA: Diagnosis not present

## 2023-08-03 DIAGNOSIS — Z0000 Encounter for general adult medical examination without abnormal findings: Secondary | ICD-10-CM | POA: Diagnosis not present

## 2023-08-03 DIAGNOSIS — M353 Polymyalgia rheumatica: Secondary | ICD-10-CM | POA: Diagnosis not present

## 2023-08-03 DIAGNOSIS — E1122 Type 2 diabetes mellitus with diabetic chronic kidney disease: Secondary | ICD-10-CM | POA: Diagnosis not present

## 2023-08-03 DIAGNOSIS — F331 Major depressive disorder, recurrent, moderate: Secondary | ICD-10-CM | POA: Diagnosis not present

## 2023-08-03 LAB — LAB REPORT - SCANNED
A1c: 4.2
EGFR: 42

## 2023-08-04 ENCOUNTER — Ambulatory Visit: Payer: Self-pay | Admitting: Oncology

## 2023-08-04 NOTE — Telephone Encounter (Signed)
-----   Message from Arley Hof sent at 08/04/2023  6:41 AM EDT ----- Please call patient, the CML test is improved, continue nilotinib , follow-up as scheduled, be sure she had her calcium repeated with Dr. Loreli  ----- Message ----- From: Debby Olam POUR, NP Sent: 08/02/2023   3:36 PM EDT To: Arley KATHEE Hof, MD  This report looks different to me.  Stable? ----- Message ----- From: Rebecka, Lab In [place] Sent: 07/25/2023  10:09 AM EDT To: Olam POUR Debby, NP

## 2023-08-04 NOTE — Telephone Encounter (Signed)
 Patient gave verbal understanding and had no further questions or concerns

## 2023-10-26 DIAGNOSIS — M2560 Stiffness of unspecified joint, not elsewhere classified: Secondary | ICD-10-CM | POA: Diagnosis not present

## 2023-10-26 DIAGNOSIS — Z6820 Body mass index (BMI) 20.0-20.9, adult: Secondary | ICD-10-CM | POA: Diagnosis not present

## 2023-10-26 DIAGNOSIS — M2540 Effusion, unspecified joint: Secondary | ICD-10-CM | POA: Diagnosis not present

## 2023-10-26 DIAGNOSIS — M353 Polymyalgia rheumatica: Secondary | ICD-10-CM | POA: Diagnosis not present

## 2023-10-26 DIAGNOSIS — Z79899 Other long term (current) drug therapy: Secondary | ICD-10-CM | POA: Diagnosis not present

## 2023-10-30 ENCOUNTER — Other Ambulatory Visit: Payer: Self-pay | Admitting: *Deleted

## 2023-10-30 NOTE — Telephone Encounter (Signed)
 Received request from [facility] pharmacy for potassium chloride . Refill approved.

## 2023-11-02 ENCOUNTER — Other Ambulatory Visit: Payer: Self-pay | Admitting: *Deleted

## 2023-11-02 MED ORDER — POTASSIUM CHLORIDE ER 10 MEQ PO CPCR: 10.0000 meq | ORAL_CAPSULE | Freq: Every day | ORAL | 3 refills | Status: AC

## 2023-11-06 ENCOUNTER — Other Ambulatory Visit: Payer: Self-pay | Admitting: *Deleted

## 2023-11-06 DIAGNOSIS — J309 Allergic rhinitis, unspecified: Secondary | ICD-10-CM | POA: Diagnosis not present

## 2023-11-06 DIAGNOSIS — M810 Age-related osteoporosis without current pathological fracture: Secondary | ICD-10-CM | POA: Diagnosis not present

## 2023-11-06 DIAGNOSIS — D84821 Immunodeficiency due to drugs: Secondary | ICD-10-CM | POA: Diagnosis not present

## 2023-11-06 DIAGNOSIS — D8481 Immunodeficiency due to conditions classified elsewhere: Secondary | ICD-10-CM | POA: Diagnosis not present

## 2023-11-06 DIAGNOSIS — F325 Major depressive disorder, single episode, in full remission: Secondary | ICD-10-CM | POA: Diagnosis not present

## 2023-11-06 DIAGNOSIS — M353 Polymyalgia rheumatica: Secondary | ICD-10-CM | POA: Diagnosis not present

## 2023-11-06 DIAGNOSIS — Z823 Family history of stroke: Secondary | ICD-10-CM | POA: Diagnosis not present

## 2023-11-06 DIAGNOSIS — R011 Cardiac murmur, unspecified: Secondary | ICD-10-CM | POA: Diagnosis not present

## 2023-11-06 DIAGNOSIS — C9510 Chronic leukemia of unspecified cell type not having achieved remission: Secondary | ICD-10-CM | POA: Diagnosis not present

## 2023-11-06 DIAGNOSIS — M1990 Unspecified osteoarthritis, unspecified site: Secondary | ICD-10-CM | POA: Diagnosis not present

## 2023-11-06 DIAGNOSIS — E876 Hypokalemia: Secondary | ICD-10-CM | POA: Diagnosis not present

## 2023-11-06 DIAGNOSIS — I129 Hypertensive chronic kidney disease with stage 1 through stage 4 chronic kidney disease, or unspecified chronic kidney disease: Secondary | ICD-10-CM | POA: Diagnosis not present

## 2023-11-06 DIAGNOSIS — C9210 Chronic myeloid leukemia, BCR/ABL-positive, not having achieved remission: Secondary | ICD-10-CM

## 2023-11-06 DIAGNOSIS — E785 Hyperlipidemia, unspecified: Secondary | ICD-10-CM | POA: Diagnosis not present

## 2023-11-06 DIAGNOSIS — N1832 Chronic kidney disease, stage 3b: Secondary | ICD-10-CM | POA: Diagnosis not present

## 2023-11-06 DIAGNOSIS — Z7952 Long term (current) use of systemic steroids: Secondary | ICD-10-CM | POA: Diagnosis not present

## 2023-11-06 MED ORDER — NILOTINIB HCL 150 MG PO CAPS: ORAL_CAPSULE | ORAL | 5 refills | Status: AC

## 2023-11-06 NOTE — Telephone Encounter (Signed)
 [name]  called to report she needs refills on Tasigna . Refills sent as requested.

## 2023-11-13 ENCOUNTER — Telehealth: Payer: Self-pay | Admitting: *Deleted

## 2023-11-13 NOTE — Telephone Encounter (Signed)
 Patient called reporting that [employer] has not received her Tasigna  script that was sent on 11/06/23 to [facility] pharmacy.  This RN called pharmacy and confirmed it was received, but [employer] needs to verify script. Transferred to [employer], that said verification is not needed. Will ship via [employer] for delivery on 11/16/23. Patient notified.

## 2023-11-21 ENCOUNTER — Inpatient Hospital Stay: Admitting: Oncology

## 2023-11-21 ENCOUNTER — Inpatient Hospital Stay

## 2023-11-21 ENCOUNTER — Inpatient Hospital Stay: Attending: Oncology

## 2023-11-21 VITALS — BP 135/65 | HR 60 | Temp 97.8°F | Resp 18 | Ht 63.0 in | Wt 120.1 lb

## 2023-11-21 DIAGNOSIS — Z23 Encounter for immunization: Secondary | ICD-10-CM | POA: Diagnosis not present

## 2023-11-21 DIAGNOSIS — Z79620 Long term (current) use of immunosuppressive biologic: Secondary | ICD-10-CM | POA: Diagnosis not present

## 2023-11-21 DIAGNOSIS — C9210 Chronic myeloid leukemia, BCR/ABL-positive, not having achieved remission: Secondary | ICD-10-CM

## 2023-11-21 DIAGNOSIS — C9211 Chronic myeloid leukemia, BCR/ABL-positive, in remission: Secondary | ICD-10-CM | POA: Insufficient documentation

## 2023-11-21 LAB — CMP (CANCER CENTER ONLY)
ALT: 18 U/L (ref 0–44)
AST: 23 U/L (ref 15–41)
Albumin: 4.5 g/dL (ref 3.5–5.0)
Alkaline Phosphatase: 44 U/L (ref 38–126)
Anion gap: 11 (ref 5–15)
BUN: 22 mg/dL (ref 8–23)
CO2: 27 mmol/L (ref 22–32)
Calcium: 10.1 mg/dL (ref 8.9–10.3)
Chloride: 104 mmol/L (ref 98–111)
Creatinine: 1.12 mg/dL — ABNORMAL HIGH (ref 0.44–1.00)
GFR, Estimated: 47 mL/min — ABNORMAL LOW (ref >60)
Glucose, Bld: 109 mg/dL — ABNORMAL HIGH (ref 70–99)
Potassium: 3.7 mmol/L (ref 3.5–5.1)
Sodium: 141 mmol/L (ref 135–145)
Total Bilirubin: 0.8 mg/dL (ref 0.0–1.2)
Total Protein: 7.4 g/dL (ref 6.5–8.1)

## 2023-11-21 LAB — CBC WITH DIFFERENTIAL (CANCER CENTER ONLY)
Abs Immature Granulocytes: 0.1 K/uL — ABNORMAL HIGH (ref 0.00–0.07)
Basophils Absolute: 0.1 K/uL (ref 0.0–0.1)
Basophils Relative: 1 %
Eosinophils Absolute: 0.2 K/uL (ref 0.0–0.5)
Eosinophils Relative: 2 %
HCT: 38.2 % (ref 36.0–46.0)
Hemoglobin: 13.2 g/dL (ref 12.0–15.0)
Immature Granulocytes: 1 %
Lymphocytes Relative: 20 %
Lymphs Abs: 2.1 K/uL (ref 0.7–4.0)
MCH: 29.8 pg (ref 26.0–34.0)
MCHC: 34.6 g/dL (ref 30.0–36.0)
MCV: 86.2 fL (ref 80.0–100.0)
Monocytes Absolute: 0.9 K/uL (ref 0.1–1.0)
Monocytes Relative: 9 %
Neutro Abs: 7.1 K/uL (ref 1.7–7.7)
Neutrophils Relative %: 67 %
Platelet Count: 268 K/uL (ref 150–400)
RBC: 4.43 MIL/uL (ref 3.87–5.11)
RDW: 13.9 % (ref 11.5–15.5)
WBC Count: 10.5 K/uL (ref 4.0–10.5)
nRBC: 0 % (ref 0.0–0.2)

## 2023-11-21 MED ORDER — INFLUENZA VAC SPLIT HIGH-DOSE 0.5 ML IM SUSY
0.5000 mL | PREFILLED_SYRINGE | Freq: Once | INTRAMUSCULAR | Status: AC
  Administered 2023-11-21: 0.5 mL via INTRAMUSCULAR
  Filled 2023-11-21: qty 0.5

## 2023-11-21 NOTE — Progress Notes (Signed)
 [facility] OFFICE PROGRESS NOTE   Diagnosis: CML  INTERVAL HISTORY:   [name] returns as scheduled.  She continues nilotinib .  No rash, swelling, or diarrhea.  She feels well.  No complaint.  Objective:  Vital signs in last 24 hours:  Blood pressure 135/65, pulse 60, temperature 97.8 F (36.6 C), temperature source Temporal, resp. rate 18, height 5' 3 (1.6 m), weight 120 lb 1.6 oz (54.5 kg), SpO2 98%.    HEENT: No thrush or ulcers Resp: Lungs clear bilaterally Cardio: Regular rate and rhythm GI: No hepatosplenomegaly Vascular: No leg edema  Skin: No rash   Lab Results:  Lab Results  Component Value Date   WBC 10.5 11/21/2023   HGB 13.2 11/21/2023   HCT 38.2 11/21/2023   MCV 86.2 11/21/2023   PLT 268 11/21/2023   NEUTROABS 7.1 11/21/2023    CMP  Lab Results  Component Value Date   NA 139 07/25/2023   K 4.2 07/25/2023   CL 101 07/25/2023   CO2 26 07/25/2023   GLUCOSE 109 (H) 07/25/2023   BUN 25 (H) 07/25/2023   CREATININE 1.30 (H) 07/25/2023   CALCIUM 10.6 (H) 07/25/2023   PROT 7.4 07/25/2023   ALBUMIN 4.1 07/25/2023   AST 19 07/25/2023   ALT 15 07/25/2023   ALKPHOS 44 07/25/2023   BILITOT 0.7 07/25/2023   GFRNONAA 39 (L) 07/25/2023   GFRAA 48 (L) 07/23/2019    Medications: I have reviewed the patient's current medications.   Assessment/Plan:  Chronic myelogenous leukemia -- initially treated with hydroxyurea. She began Gleevec  on 08/27/2009. Gleevec  was placed on hold on 10/06/2009 due to poor tolerance of the 400 mg daily dose. Gleevec  was restarted when she had hematologic improvement on 10/13/2009 and the dose was changed to 300 mg on 10/26/2009. The Gleevec  dose was reduced to 200 mg daily due to excessive tearing. The peripheral blood BCR/ABL returned at 16% on 04/19/2010. Gleevec  was discontinued secondary to toxicity and a persistent elevation of the peripheral blood translocation . She began Nilotinib  on 06/09/2011.  Nilotinib  placed on hold beginning 08/04/2011 due to fatigue/dyspnea on exertion and slight QT prolongation. Nilotinib  was resumed on 08/15/2011 at a dose of 300 mg daily. Peripheral blood PCR was negative on 12/27/2011 and 05/14/2012. Mild elevation of the peripheral blood PCR on 10/02/2012. Improved on repeat peripheral blood PCR 01/01/2013. Stable 11/25/2014. Improved 05/26/2015. Stable 08/25/2015. Improved 11/24/2015. Patient self reduced dose of Nilotinib  from 300 mg daily to 150 mg daily in December 2018 Nilotinib  200 mg daily June 2019 Peripheral blood PCR higher on 09/12/2017 Nilotinib  increased to 300 mg daily beginning 09/27/2017 Peripheral blood PCR improved 12/19/2017, stable on 03/20/2018, stable 07/24/2018, stable 11/20/2018, stable 03/27/2019, 07/26/2019, 11/20/2019, 03/17/2020, 07/21/2020, improved 11/24/2020, slightly higher 03/31/2021, higher 07/27/2021, improved 11/23/2021, stable 03/29/2022, improved 07/26/2022, improved 03/28/2023 and 07/25/2023 Diarrhea. Intermittent, mild. Periorbital edema -improved since stopping Gleevec . Increased tearing-improved with discontinuance of Gleevec .   History of a fine papular rash over the trunk and legs-likely secondary to the nilotinib .   Mild QT prolongation and multifocal atrial pacemaker on EKG 08/04/2011. QT normal on EKG 08/26/2014. QTc normal 05/26/2015,08/25/2015,  02/23/2016 , 06/13/2017, 11/19/2019 History of mild hypokalemia -maintained on oral potassium replacement. Potassium in normal range on labs today. Hypertension Polymyalgia rheumatica    Disposition: Jill. Bok remains in hematologic and partial molecular remission from [facility].  We will follow-up on the peripheral blood PCR from today.  She continues to tolerate the nilotinib  well.  She will return for  an office and lab visit in 4 months.  Jill. Jankowiak received an influenza vaccine today.  Arley Hof, MD  11/21/2023  10:45 AM

## 2023-11-26 LAB — BCR-ABL1, CML/ALL, PCR, QUANT
E1A2 Transcript: <0.0032 %
b2a2 transcript: 0.1016 %
b3a2 transcript: <0.0032 %

## 2023-11-27 ENCOUNTER — Ambulatory Visit: Payer: Self-pay | Admitting: Oncology

## 2023-11-27 NOTE — Telephone Encounter (Signed)
-----   Message from Arley Hof sent at 11/27/2023  4:12 PM EDT ----- Please call patient, the peripheral blood PCR is stable, continue nilotinib , follow-up as scheduled  ----- Message ----- From: Rebecka, Lab In [place] Sent: 11/21/2023  10:34 AM EDT To: Arley KATHEE Hof, MD

## 2023-11-27 NOTE — Telephone Encounter (Signed)
 Patient gave verbal understanding and had no further questions.

## 2024-01-16 ENCOUNTER — Telehealth: Payer: Self-pay | Admitting: Pharmacy Technician

## 2024-01-16 NOTE — Telephone Encounter (Addendum)
 Oral Oncology Patient Advocate Encounter   Began re-enrollment application for assistance for Tasigna  through [employer].   Application will be submitted upon completion of necessary supporting documentation.   NPAF phone number [phone].   Waiting on MD signatures and patient signatures/income.  Application and attestation letter have been emailed to Devere Leaven, [employer].   Blayke Cordrey (Patty) [name], CPhT  [facility], [name], [facility] - Oral Chemotherapy Patient Advocate Specialist III Phone: [phone]  Fax: [phone]

## 2024-01-18 NOTE — Telephone Encounter (Signed)
 Oral Oncology Patient Advocate Encounter  Called and spoke with patient this morning and she confirmed that she will be going to the cancer center today to sign the application. She stated she does not have her SSI letter yet but knows to call me once she has received it.  Corbin Hott (Patty) [name], CPhT  [facility], [name], [facility] - Oral Chemotherapy Patient Advocate Specialist III Phone: [phone]  Fax: [phone]

## 2024-01-26 NOTE — Telephone Encounter (Signed)
 Oral Oncology Patient Advocate Encounter  I have the patient's and MD signature. I am just waiting on patient's income.  Jill Haught (Patty) [name], CPhT  [facility], [name], [facility] - Oral Chemotherapy Patient Advocate Specialist III Phone: [phone]  Fax: [phone]

## 2024-01-30 NOTE — Telephone Encounter (Signed)
 Oral Oncology Patient Advocate Encounter  Patient's daughter requested my email so that she is able to send me the patient's income information.   I have sent two emails to the patient's daughter @  [email or web address].  [name] (Patty) [name], CPhT  [facility], [name], [facility] - Oral Chemotherapy Patient Advocate Specialist III Phone: [phone]  Fax: [phone]

## 2024-02-16 NOTE — Telephone Encounter (Signed)
 Oral Oncology Patient Advocate Encounter  Called patient's daughter for an update, she stated she forgot to send me the required information.  I have resent an email to the patient's daughter.  Chaeli Judy (Patty) [name], CPhT  [facility], [name], [facility] - Oral Chemotherapy Patient Advocate Specialist III Phone: [phone]  Fax: [phone]

## 2024-02-21 NOTE — Telephone Encounter (Signed)
 Oral Oncology Patient Advocate Encounter  Oral chemo signing off on the re-enrollment process for now. Patient/her daughter have not provided us  with the required information to complete the re-enrollment process.  I will update this encounter if I ever receive the required information.  Fowler Antos (Patty) [name], CPhT  [facility], [name], [facility] - Oral Chemotherapy Patient Advocate Specialist III Phone: [phone]  Fax: [phone]

## 2024-03-11 ENCOUNTER — Telehealth: Payer: Self-pay | Admitting: Pharmacy Technician

## 2024-03-11 NOTE — Telephone Encounter (Signed)
 Oral Oncology Patient Advocate Encounter  Patient called regarding her medication.   I told the patient that her daughter has yet to provide me with her income information even though multiple emails have been sent as well as phone calls.  I advised patient to go to the [facility] as soon as possible to drop off income information herself. Patient stated she will try to do so when she has time.  Of note patient is extremely hard of hearing.  Arne Schlender (Patty) [name], CPhT  [facility], [name], [facility] - Oral Chemotherapy Pharmacy Technician III Certified Patient Advocate Phone: [phone]  Fax: [phone]

## 2024-03-12 ENCOUNTER — Telehealth: Payer: Self-pay | Admitting: Pharmacy Technician

## 2024-03-12 ENCOUNTER — Encounter: Payer: Self-pay | Admitting: *Deleted

## 2024-03-12 ENCOUNTER — Other Ambulatory Visit (HOSPITAL_COMMUNITY): Payer: Self-pay

## 2024-03-12 NOTE — Telephone Encounter (Signed)
 Oral Oncology Patient Advocate Encounter   Re-authorization   Received notification that prior authorization for Nilotinib  is required.   PA submitted on CMM via [employer] [id number] Status is pending     Nethra Mehlberg (Patty) [name], CPhT  [facility], [name], [facility] - Oral Chemotherapy Pharmacy Technician III Certified Patient Advocate Phone: [phone]  Fax: [phone]

## 2024-03-12 NOTE — Telephone Encounter (Signed)
 Oral Oncology Patient Advocate Encounter   Submitted application for assistance for Tasigna  to [employer].   Application submitted via e-fax to [phone]   NPAF phone number [phone].   I will continue to check the status until final determination.   Deborah Lazcano (Patty) [name], CPhT  [facility], [name], [facility] - Oral Chemotherapy Pharmacy Technician III Certified Patient Advocate Phone: [phone]  Fax: [phone]

## 2024-03-12 NOTE — Progress Notes (Signed)
 [name] brought in her financial info Form [phone]. This was forwarded to oral oncology team.

## 2024-03-13 NOTE — Telephone Encounter (Signed)
 Oral Oncology Patient Advocate Encounter  Called to check on the status of the patient's application.  Per NPAF representative they no longer accept letter of hardship from providers and require that patients fill out an attestation letter that NPAF provides.  NPAF attestation letter was mailed out to the patient on 03/12/2024.  NPAF phone number [phone].   I will continue to check the status until final determination.  Mckinze Poirier (Patty) [name], CPhT  [facility], [name], [facility] - Oral Chemotherapy Pharmacy Technician III Certified Patient Advocate Phone: [phone]  Fax: [phone]

## 2024-03-19 ENCOUNTER — Inpatient Hospital Stay: Admitting: Nurse Practitioner

## 2024-03-19 ENCOUNTER — Inpatient Hospital Stay
